# Patient Record
Sex: Male | Born: 1945 | Race: Black or African American | Hispanic: No | Marital: Single | Smoking: Never smoker
Health system: Southern US, Community
[De-identification: ages and names within clinical notes are randomized; demographics above are authoritative.]

## PROBLEM LIST (undated history)

## (undated) DIAGNOSIS — M1990 Unspecified osteoarthritis, unspecified site: Secondary | ICD-10-CM

## (undated) DIAGNOSIS — F209 Schizophrenia, unspecified: Secondary | ICD-10-CM

## (undated) DIAGNOSIS — R29898 Other symptoms and signs involving the musculoskeletal system: Secondary | ICD-10-CM

## (undated) DIAGNOSIS — I10 Essential (primary) hypertension: Secondary | ICD-10-CM

## (undated) DIAGNOSIS — N429 Disorder of prostate, unspecified: Secondary | ICD-10-CM

## (undated) DIAGNOSIS — M25462 Effusion, left knee: Secondary | ICD-10-CM

## (undated) DIAGNOSIS — M25461 Effusion, right knee: Secondary | ICD-10-CM

## (undated) DIAGNOSIS — H409 Unspecified glaucoma: Secondary | ICD-10-CM

## (undated) DIAGNOSIS — T7840XA Allergy, unspecified, initial encounter: Secondary | ICD-10-CM

## (undated) HISTORY — DX: Effusion, right knee: M25.461

## (undated) HISTORY — DX: Unspecified osteoarthritis, unspecified site: M19.90

## (undated) HISTORY — DX: Effusion, left knee: M25.462

## (undated) HISTORY — DX: Unspecified glaucoma: H40.9

## (undated) HISTORY — DX: Allergy, unspecified, initial encounter: T78.40XA

## (undated) HISTORY — DX: Other symptoms and signs involving the musculoskeletal system: R29.898

---

## 1995-10-18 HISTORY — PX: ANKLE SURGERY: SHX546

## 2014-10-27 ENCOUNTER — Ambulatory Visit (HOSPITAL_BASED_OUTPATIENT_CLINIC_OR_DEPARTMENT_OTHER): Payer: Medicare Other

## 2015-04-06 ENCOUNTER — Emergency Department (INDEPENDENT_AMBULATORY_CARE_PROVIDER_SITE_OTHER)
Admission: EM | Admit: 2015-04-06 | Discharge: 2015-04-06 | Disposition: A | Discharge status: Home / Self Care | Payer: Medicare Other | Source: Home / Self Care | Attending: Emergency Medicine | Admitting: Emergency Medicine

## 2015-04-06 ENCOUNTER — Encounter (HOSPITAL_COMMUNITY): Payer: Self-pay | Admitting: Emergency Medicine

## 2015-04-06 DIAGNOSIS — L97512 Non-pressure chronic ulcer of other part of right foot with fat layer exposed: Secondary | ICD-10-CM

## 2015-04-06 DIAGNOSIS — L03115 Cellulitis of right lower limb: Secondary | ICD-10-CM | POA: Diagnosis not present

## 2015-04-06 HISTORY — DX: Disorder of prostate, unspecified: N42.9

## 2015-04-06 HISTORY — DX: Essential (primary) hypertension: I10

## 2015-04-06 HISTORY — DX: Schizophrenia, unspecified: F20.9

## 2015-04-06 MED ORDER — CEFTRIAXONE SODIUM 1 G IJ SOLR
1.0000 g | Freq: Once | INTRAMUSCULAR | Status: AC
  Administered 2015-04-06: 1 g via INTRAMUSCULAR

## 2015-04-06 MED ORDER — CEFTRIAXONE SODIUM 1 G IJ SOLR
INTRAMUSCULAR | Status: AC
  Filled 2015-04-06: qty 10

## 2015-04-06 MED ORDER — LIDOCAINE HCL (PF) 1 % IJ SOLN
INTRAMUSCULAR | Status: AC
Start: 2015-04-06 — End: 2015-04-06
  Filled 2015-04-06: qty 5

## 2015-04-06 MED ORDER — BACITRACIN 500 UNIT/GM EX OINT
1.0000 "application " | TOPICAL_OINTMENT | Freq: Once | CUTANEOUS | Status: AC
  Administered 2015-04-06: 1 via TOPICAL

## 2015-04-06 MED ORDER — CLINDAMYCIN HCL 300 MG PO CAPS: 300.0000 mg | ORAL_CAPSULE | Freq: Three times a day (TID) | ORAL | Status: DC

## 2015-04-06 NOTE — ED Provider Notes (Signed)
CSN: [id number]     Arrival date & time 04/06/15  1857 History   First MD Initiated Contact with Patient 04/06/15 1955     Chief Complaint  Patient presents with  . Wound Infection   (Consider location/radiation/quality/duration/timing/severity/associated sxs/prior Treatment) HPI Comments: 69 year old male apparently has had an ulcer to the right foot for 2-3 weeks. It was noticed by his significant other last night. She brings him in today for swelling of the lower extremity about 1 Half Way between the leg and the ankle including the ankle and foot. There is approximately 6 x 4 cm open wound/ulceration to the right lateral ankle. There is no purulence. No lymphangitis. Minor erythema about the ankle and lower most right lower extremity.   Past Medical History  Diagnosis Date  . Hypertension   . Prostate disorder   . Schizophrenia    History reviewed. No pertinent past surgical history. No family history on file. History  Substance Use Topics  . Smoking status: Never Smoker   . Smokeless tobacco: Not on file  . Alcohol Use: No    Review of Systems  Constitutional: Negative.  Negative for fever and fatigue.  Respiratory: Negative for cough and shortness of breath.   Gastrointestinal: Negative.   Musculoskeletal: Negative.   Skin: Positive for wound.  Neurological: Negative.     Allergies  Review of patient's allergies indicates no known allergies.  Home Medications   Prior to Admission medications   Medication Sig Start Date End Date Taking? Authorizing Provider  chlorthalidone (HYGROTON) 25 MG tablet Take 25 mg by mouth daily.   Yes Historical Provider, MD  diclofenac (VOLTAREN) 75 MG EC tablet Take 75 mg by mouth 2 (two) times daily.   Yes Historical Provider, MD  finasteride (PROSCAR) 5 MG tablet Take 5 mg by mouth daily.   Yes Historical Provider, MD  furosemide (LASIX) 20 MG tablet Take 20 mg by mouth.   Yes Historical Provider, MD  travoprost, benzalkonium,  (TRAVATAN) 0.004 % ophthalmic solution 1 drop at bedtime.   Yes Historical Provider, MD  clindamycin (CLEOCIN) 300 MG capsule Take 1 capsule (300 mg total) by mouth 3 (three) times daily. 04/06/15   Hayden Rasmussen, NP   BP 157/96 mmHg  Pulse 101  Temp(Src) 98.2 F (36.8 C) (Oral)  Resp 18  SpO2 99% Physical Exam  Constitutional: He appears well-developed and well-nourished. No distress.  Neck: Normal range of motion. Neck supple.  Pulmonary/Chest: Effort normal. No respiratory distress.  Musculoskeletal: He exhibits edema.  Edema about halfway down the right lower extremity to include the foot. Edema of the lower extremity is 4+ pitting.  Neurological: He is alert. He exhibits normal muscle tone.  Skin: Skin is warm and dry. No rash noted. There is erythema.  Ulceration/open skin wound to the right lateral ankle. No purulence. Tissue with primarily pink  Psychiatric: He has a normal mood and affect.  Nursing note and vitals reviewed.   ED Course  Procedures (including critical care time) Labs Review Labs Reviewed - No data to display  Imaging Review No results found.   MDM   1. Foot ulcer, right, with fat layer exposed   2. Cellulitis of right lower extremity    Irrigated wound with wound cleanser and normal saline. Bacitracin ointment applied and sterile dressing. We will wrap the foot and right lower extremity with Ace bandage to assist with compression due to edema. He is to follow back up here at the urgent care in 2 days  for wound check. He has an appointment with the wound care center on Friday and will keep that appointment. Clindamycin 300 mg 3 times a day Rocephin 1 g IM now    Hayden Rasmussen, NP 04/06/15 2024

## 2015-04-06 NOTE — Discharge Instructions (Signed)
Cellulitis °Cellulitis is an infection of the skin and the tissue under the skin. The infected area is usually red and tender. This happens most often in the arms and lower legs. °HOME CARE  °· Take your antibiotic medicine as told. Finish the medicine even if you start to feel better. °· Keep the infected arm or leg raised (elevated). °· Put a warm cloth on the area up to 4 times per day. °· Only take medicines as told by your doctor. °· Keep all doctor visits as told. °GET HELP IF: °· You see red streaks on the skin coming from the infected area. °· Your red area gets bigger or turns a dark color. °· Your bone or joint under the infected area is painful after the skin heals. °· Your infection comes back in the same area or different area. °· You have a puffy (swollen) bump in the infected area. °· You have new symptoms. °· You have a fever. °GET HELP RIGHT AWAY IF:  °· You feel very sleepy. °· You throw up (vomit) or have watery poop (diarrhea). °· You feel sick and have muscle aches and pains. °MAKE SURE YOU:  °· Understand these instructions. °· Will watch your condition. °· Will get help right away if you are not doing well or get worse. °Document Released: 03/21/2008 Document Revised: 02/17/2014 Document Reviewed: 12/19/2011 °ExitCare® Patient Information ©2015 [employer]. This information is not intended to replace advice given to you by your health care provider. Make sure you discuss any questions you have with your health care provider. ° °Skin Ulcer °A skin ulcer is an open sore that can be shallow or deep. Skin ulcers sometimes become infected and are difficult to treat. It may be 1 month or longer before real healing progress is made. °CAUSES  °· Injury. °· Problems with the veins or arteries. °· Diabetes. °· Insect bites. °· Bedsores. °· Inflammatory conditions. °SYMPTOMS  °· Pain, redness, swelling, and tenderness around the ulcer. °· Fever. °· Bleeding from the ulcer. °· Yellow or clear fluid  coming from the ulcer. °DIAGNOSIS  °There are many types of skin ulcers. Any open sores will be examined. Certain tests will be done to determine the kind of ulcer you have. The right treatment depends on the type of ulcer you have. °TREATMENT  °Treatment is a long-term challenge. It may include: °· Wearing an elastic wrap, compression stockings, or gel cast over the ulcer area. °· Taking antibiotic medicines or putting antibiotic creams on the affected area if there is an infection. °HOME CARE INSTRUCTIONS °· Put on your bandages (dressings), wraps, or casts over the ulcer as directed by your caregiver. °· Change all dressings as directed by your caregiver. °· Take all medicines as directed by your caregiver. °· Keep the affected area clean and dry. °· Avoid injuries to the affected area. °· Eat a well-balanced, healthy diet that includes plenty of fruit and vegetables. °· If you smoke, consider quitting or decreasing the amount of cigarettes you smoke. °· Once the ulcer heals, get regular exercise as directed by your caregiver. °· Work with your caregiver to make sure your blood pressure, cholesterol, and diabetes are well-controlled. °· Keep your skin moisturized. Dry skin can crack and lead to skin ulcers. °SEEK IMMEDIATE MEDICAL CARE IF:  °· Your pain gets worse. °· You have swelling, redness, or fluids around the ulcer. °· You have chills. °· You have a fever. °MAKE SURE YOU:  °· Understand these instructions. °· Will   watch your condition. °· Will get help right away if you are not doing well or get worse. °Document Released: 11/10/2004 Document Revised: 12/26/2011 Document Reviewed: 05/20/2011 °ExitCare® Patient Information ©2015 [employer]. This information is not intended to replace advice given to you by your health care provider. Make sure you discuss any questions you have with your health care provider. ° °

## 2015-04-06 NOTE — ED Notes (Signed)
Pt has a wound inf on lateral part of right ankle onset 3 weeks; getting worse Sister put a dressing on it last night Denies inj/trauma Ambulated well to exam room Alert, no signs of acute distress.

## 2015-04-08 ENCOUNTER — Emergency Department (HOSPITAL_COMMUNITY)
Admission: EM | Admit: 2015-04-08 | Discharge: 2015-04-08 | Disposition: A | Discharge status: Home / Self Care | Payer: Medicare Other | Source: Home / Self Care | Attending: Family Medicine | Admitting: Family Medicine

## 2015-04-08 ENCOUNTER — Encounter (HOSPITAL_COMMUNITY): Payer: Self-pay | Admitting: Emergency Medicine

## 2015-04-08 DIAGNOSIS — L97312 Non-pressure chronic ulcer of right ankle with fat layer exposed: Secondary | ICD-10-CM | POA: Diagnosis not present

## 2015-04-08 NOTE — ED Notes (Signed)
Patient requesting leg to be reevaluated and rewrapped.

## 2015-04-08 NOTE — ED Notes (Signed)
Ortho tech at bedside 

## 2015-04-08 NOTE — Discharge Instructions (Signed)
Go to wound center as planned, keep bandage dry.

## 2015-04-08 NOTE — ED Provider Notes (Signed)
CSN: [id number]     Arrival date & time 04/08/15  1847 History   First MD Initiated Contact with Patient 04/08/15 1924     No chief complaint on file.  (Consider location/radiation/quality/duration/timing/severity/associated sxs/prior Treatment) Patient is a 69 y.o. male presenting with rash. The history is provided by the patient.  Rash Location:  Leg Leg rash location:  R ankle Quality: draining and weeping   Severity:  Moderate Onset quality:  Gradual Duration:  3 weeks Progression:  Unchanged Chronicity:  Chronic Context comment:  Ulceration care begun on 6/20 has wound care center f/u on fri upcoming, here for recheck.   Past Medical History  Diagnosis Date  . Hypertension   . Prostate disorder   . Schizophrenia    No past surgical history on file. No family history on file. History  Substance Use Topics  . Smoking status: Never Smoker   . Smokeless tobacco: Not on file  . Alcohol Use: No    Review of Systems  Constitutional: Negative.   Skin: Positive for rash and wound.    Allergies  Review of patient's allergies indicates no known allergies.  Home Medications   Prior to Admission medications   Medication Sig Start Date End Date Taking? Authorizing Provider  chlorthalidone (HYGROTON) 25 MG tablet Take 25 mg by mouth daily.    Historical Provider, MD  clindamycin (CLEOCIN) 300 MG capsule Take 1 capsule (300 mg total) by mouth 3 (three) times daily. 04/06/15   Hayden Rasmussen, NP  diclofenac (VOLTAREN) 75 MG EC tablet Take 75 mg by mouth 2 (two) times daily.    Historical Provider, MD  finasteride (PROSCAR) 5 MG tablet Take 5 mg by mouth daily.    Historical Provider, MD  furosemide (LASIX) 20 MG tablet Take 20 mg by mouth.    Historical Provider, MD  travoprost, benzalkonium, (TRAVATAN) 0.004 % ophthalmic solution 1 drop at bedtime.    Historical Provider, MD   BP 149/87 mmHg  Pulse 95  Temp(Src) 98.2 F (36.8 C) (Oral)  Resp 20  SpO2 100% Physical Exam   Constitutional: He is oriented to person, place, and time. He appears well-developed and well-nourished.  Musculoskeletal: He exhibits no tenderness.  Neurological: He is alert and oriented to person, place, and time.  Skin: Skin is warm and dry. No erythema.  Right lat ankle open ulcer  Nursing note and vitals reviewed.   ED Course  Procedures (including critical care time) Labs Review Labs Reviewed - No data to display  Imaging Review No results found.   MDM  No diagnosis found. Wound care for leg ulcer, unna boot wrap.    Linna Hoff, MD 04/08/15 [phone]

## 2015-09-23 DIAGNOSIS — L97312 Non-pressure chronic ulcer of right ankle with fat layer exposed: Secondary | ICD-10-CM | POA: Insufficient documentation

## 2017-05-10 DIAGNOSIS — I872 Venous insufficiency (chronic) (peripheral): Secondary | ICD-10-CM | POA: Insufficient documentation

## 2017-08-09 DIAGNOSIS — L97312 Non-pressure chronic ulcer of right ankle with fat layer exposed: Secondary | ICD-10-CM | POA: Insufficient documentation

## 2018-01-22 ENCOUNTER — Encounter: Payer: Self-pay | Admitting: Gastroenterology

## 2018-03-26 ENCOUNTER — Encounter: Payer: Medicare Other | Admitting: Gastroenterology

## 2018-05-10 ENCOUNTER — Ambulatory Visit (AMBULATORY_SURGERY_CENTER): Payer: Self-pay

## 2018-05-10 VITALS — Ht 70.0 in | Wt 204.6 lb

## 2018-05-10 DIAGNOSIS — Z1211 Encounter for screening for malignant neoplasm of colon: Secondary | ICD-10-CM

## 2018-05-10 MED ORDER — PEG 3350-KCL-NA BICARB-NACL 420 G PO SOLR: 4000.0000 mL | Freq: Once | ORAL | 0 refills | Status: AC

## 2018-05-10 NOTE — Progress Notes (Signed)
Pt came into the office today for his PV via a wheelchair.  Patients sister Brandon [name](Brandon [name]) was with him. Spent over an hour with the pt and family member trying to get medical history and answer medical questions..Pt had difficulty getting on the scale for his correct height and weight due to difficulty standing for long period of time.Informed pt he would need to be able to climb on the table for the colon. He states he could walk with a walker, but would need assistance getting up on the table.   Per pt no allergies to soy or egg products.Pt not taking any weight loss meds or using  O2 at home.  Pt does not have email, will watch colon online.

## 2018-05-25 ENCOUNTER — Encounter: Payer: Medicare Other | Admitting: Gastroenterology

## 2018-06-20 ENCOUNTER — Encounter: Payer: Self-pay | Admitting: Gastroenterology

## 2018-07-04 ENCOUNTER — Encounter: Payer: Medicare Other | Admitting: Gastroenterology

## 2019-01-01 ENCOUNTER — Encounter: Payer: Self-pay | Admitting: Gastroenterology

## 2019-01-29 ENCOUNTER — Telehealth: Payer: Self-pay | Admitting: Emergency Medicine

## 2019-01-29 NOTE — Telephone Encounter (Signed)
OK with me.

## 2019-01-29 NOTE — Telephone Encounter (Signed)
Dr. Christella Hartigan   This patient was scheduled to see Dr. Orvan Falconer on 01-29-2019, his sister requested that both she and her brother see you because her mother is your patient. Ok to switch?

## 2019-01-30 ENCOUNTER — Ambulatory Visit: Payer: Medicare Other | Admitting: Gastroenterology

## 2019-01-30 NOTE — Telephone Encounter (Signed)
Patient scheduled for 02/04/2019 with Dr. Christella Hartigan

## 2019-01-30 NOTE — Telephone Encounter (Signed)
I am happy to see the whole family.  One at a time of course.  Offer my next available NGI appts, thanks

## 2019-02-04 ENCOUNTER — Ambulatory Visit (INDEPENDENT_AMBULATORY_CARE_PROVIDER_SITE_OTHER): Payer: Medicare Other | Admitting: Gastroenterology

## 2019-02-04 ENCOUNTER — Other Ambulatory Visit: Payer: Self-pay

## 2019-02-04 ENCOUNTER — Encounter: Payer: Self-pay | Admitting: Gastroenterology

## 2019-02-04 VITALS — BP 140/74 | HR 103 | Ht 72.0 in | Wt 209.0 lb

## 2019-02-04 DIAGNOSIS — R195 Other fecal abnormalities: Secondary | ICD-10-CM | POA: Diagnosis not present

## 2019-02-04 NOTE — Progress Notes (Signed)
This service was provided via virtual visit.  Only audio was used.  The patient was located at home.  I was located in my office.  The patient did consent to this virtual visit and is aware of possible charges through their insurance for this visit.  The patient is a new patient.  My certified medical assistant contributed to this visit by contacting the patient by phone 1 or 2 business days prior to the appointment and also followed up on the recommendations I made after the visit.   HPI: This is a very pleasant 73 year old man whom I am meeting for the first time over the phone today because of coronavirus restrictions.  He has some psychiatric issues and his sister is with him who provided some of the history.  Intermittent trouble with his bowels for 1-2 years.  He's been having diarrhea, soft stools.  This occurs every day. He has 2 BMs daily.  It can be mushy.  Never sees blood in his stools.  No nocturnal issues.  Can have watery stools.  Has lost 5 pounds in the past few months.  No abdominal pains.  No colon cancer in the family.  Never screened for colon cancer.  He drinks sodas (sounds like 2-3 slushies 16 oz daily).  Never had testing for the loose stools.  He has a cologuard stool testing sent to him by his insurance company, hasn't completed it yet.   Chief complaint is chronic loose stools  ROS: complete GI ROS as described in HPI, all other review negative.  Constitutional:  No unintentional weight loss   Past Medical History:  Diagnosis Date  . Allergy   . Arthritis   . Bilateral knee swelling   . Glaucoma   . Hypertension   . Prostate disorder   . Schizophrenia (HCC)   . Weakness of lower extremity    left knee, uses walker    Past Surgical History:  Procedure Laterality Date  . ANKLE SURGERY  1997   had broken right ankle/had 3 surgeries in 1996/ has pins and rods    Current Outpatient Medications  Medication Sig Dispense Refill  . aspirin EC 81  MG tablet Take 81 mg by mouth daily.    Marland Kitchen buPROPion (WELLBUTRIN) 75 MG tablet Take 75 mg by mouth daily.    . finasteride (PROSCAR) 5 MG tablet Take 5 mg by mouth daily.    . furosemide (LASIX) 20 MG tablet Take 40 mg by mouth.     . losartan (COZAAR) 25 MG tablet Take 25 mg by mouth daily.    . tamsulosin (FLOMAX) 0.4 MG CAPS capsule Take 0.4 mg by mouth daily.    . travoprost, benzalkonium, (TRAVATAN) 0.004 % ophthalmic solution Place 1 drop into both eyes at bedtime.     Marland Kitchen trifluoperazine (STELAZINE) 5 MG tablet Take 5 mg by mouth at bedtime.    . Vitamin D, Ergocalciferol, (DRISDOL) 50000 units CAPS capsule Take 50,000 Units by mouth every 7 (seven) days.     No current facility-administered medications for this visit.     Allergies as of 02/04/2019  . (No Known Allergies)    Family History  Problem Relation Age of Onset  . Kidney disease Mother   . Breast cancer Mother   . Breast cancer Father   . Leukemia Father     Social History   Socioeconomic History  . Marital status: Single    Spouse name: Not on file  . Number of children: Not  on file  . Years of education: Not on file  . Highest education level: Not on file  Occupational History  . Not on file  Social Needs  . Financial resource strain: Not on file  . Food insecurity:    Worry: Not on file    Inability: Not on file  . Transportation needs:    Medical: Not on file    Non-medical: Not on file  Tobacco Use  . Smoking status: Never Smoker  . Smokeless tobacco: Never Used  Substance and Sexual Activity  . Alcohol use: No  . Drug use: No  . Sexual activity: Not on file  Lifestyle  . Physical activity:    Days per week: Not on file    Minutes per session: Not on file  . Stress: Not on file  Relationships  . Social connections:    Talks on phone: Not on file    Gets together: Not on file    Attends religious service: Not on file    Active member of club or organization: Not on file    Attends  meetings of clubs or organizations: Not on file    Relationship status: Not on file  . Intimate partner violence:    Fear of current or ex partner: Not on file    Emotionally abused: Not on file    Physically abused: Not on file    Forced sexual activity: Not on file  Other Topics Concern  . Not on file  Social History Narrative  . Not on file     Physical Exam: Unable to perform because this was a "telemed visit" due to current Covid-19 pandemic BP 140/74   Pulse (!) 103   Ht 6' (1.829 m)   Wt 209 lb (94.8 kg)   BMI 28.35 kg/m    Assessment and plan: 73 y.o. male with chronic loose stools  Unclear etiology but I recommended stool testing and blood tests to start off the work-up.  See those summarized in patient instructions.  If these are not helpful then he will likely need a colonoscopy.  Please see the "Patient Instructions" section for addition details about the plan.  Rob Buntinganiel Delrico Minehart, MD [facility] 02/04/2019, 2:06 PM

## 2019-02-04 NOTE — Patient Instructions (Signed)
Blood test and stool tests to work-up his chronic loose stools, diarrhea: Sed rate, total IgA level, TTG, TSH, stool for GI pathogen panel and also for fecal leukocytes.   Pending those results he may need a colonoscopy and he understands that.

## 2019-03-28 DIAGNOSIS — R059 Cough, unspecified: Secondary | ICD-10-CM | POA: Insufficient documentation

## 2019-03-28 DIAGNOSIS — K219 Gastro-esophageal reflux disease without esophagitis: Secondary | ICD-10-CM | POA: Insufficient documentation

## 2019-03-28 DIAGNOSIS — H9113 Presbycusis, bilateral: Secondary | ICD-10-CM | POA: Insufficient documentation

## 2019-10-09 ENCOUNTER — Other Ambulatory Visit: Payer: Self-pay

## 2019-10-09 ENCOUNTER — Encounter: Payer: Self-pay | Admitting: Podiatry

## 2019-10-09 ENCOUNTER — Ambulatory Visit: Payer: Medicare Other | Admitting: Podiatry

## 2019-10-09 DIAGNOSIS — M79674 Pain in right toe(s): Secondary | ICD-10-CM

## 2019-10-09 DIAGNOSIS — B351 Tinea unguium: Secondary | ICD-10-CM | POA: Insufficient documentation

## 2019-10-09 DIAGNOSIS — M79675 Pain in left toe(s): Secondary | ICD-10-CM | POA: Diagnosis not present

## 2019-10-09 NOTE — Progress Notes (Signed)
This patient presents to the office for treatment of his  long thick painful nails.  Patient says the nails are painful walking and wearing shoes.  Patient is unable to self treat.   Patient states his nails have been treated for months.  He presents to the office for preventative foot care services.  General Appearance  Alert, conversant and in no acute stress.  Vascular  Dorsalis pedis and posterior tibial  pulses are palpable  bilaterally.  Capillary return is within normal limits  bilaterally. Temperature is within normal limits  bilaterally.  Neurologic  Senn-Weinstein monofilament wire test within normal limits  bilaterally. Muscle power diminished  bilaterally.  Nails Thick disfigured discolored nails with subungual debris  from hallux to fifth toes bilaterally. No evidence of bacterial infection or drainage bilaterally.  Orthopedic  No limitations of motion  feet .  No crepitus or effusions noted.  HAV  B/L.  Hallux Interphalangeus  B/L.    Skin  normotropic skin with no porokeratosis noted bilaterally.  No signs of infections or ulcers noted.  Callus at medial plantar aspect IPJ  B/L asymptomatic.  Onychomycosis  B/L.  Callus hallux  B/L   IE.  Debride nails  X 10.   Discussed his nails with patient.  Recommended he return in 3 months for continued care.  Upon leaving he says he was feeling much better.     Gardiner Barefoot DPM

## 2019-11-07 ENCOUNTER — Ambulatory Visit: Payer: Medicare Other | Attending: Internal Medicine

## 2019-11-07 DIAGNOSIS — Z23 Encounter for immunization: Secondary | ICD-10-CM | POA: Insufficient documentation

## 2019-11-07 NOTE — Progress Notes (Signed)
   Covid-19 Vaccination Clinic  Name:  Brandon [name]    MRN: [id number] DOB: 1946/06/23  11/07/2019  [name] was observed post Covid-19 immunization for 15 minutes without incidence. He was provided with Vaccine Information Sheet and instruction to access the V-Safe system.   [name] was instructed to call 911 with any severe reactions post vaccine: Marland Kitchen Difficulty breathing  . Swelling of your face and throat  . A fast heartbeat  . A bad rash all over your body  . Dizziness and weakness    Immunizations Administered    Name Date Dose VIS Date Route   Pfizer COVID-19 Vaccine 11/07/2019  3:29 PM 0.3 mL 09/27/2019 Intramuscular   Manufacturer: [employer], [employer]   Lot: [id number]   NDC: [address]-6

## 2019-11-28 ENCOUNTER — Ambulatory Visit: Payer: Medicare Other | Attending: Internal Medicine

## 2019-11-28 DIAGNOSIS — Z23 Encounter for immunization: Secondary | ICD-10-CM | POA: Insufficient documentation

## 2019-11-28 NOTE — Progress Notes (Signed)
   Covid-19 Vaccination Clinic  Name:  Brandon [name]    MRN: [id number] DOB: [birth year]  11/28/2019  Mr. Toren was observed post Covid-19 immunization for 15 minutes without incidence. He was provided with Vaccine Information Sheet and instruction to access the V-Safe system.   Mr. Belmontes was instructed to call 911 with any severe reactions post vaccine: Marland Kitchen Difficulty breathing  . Swelling of your face and throat  . A fast heartbeat  . A bad rash all over your body  . Dizziness and weakness    Immunizations Administered    Name Date Dose VIS Date Route   Pfizer COVID-19 Vaccine 11/28/2019  4:48 PM 0.3 mL 09/27/2019 Intramuscular   Manufacturer: [employer], [employer]   Lot: [id number]   NDC: [address]-7

## 2020-01-07 ENCOUNTER — Other Ambulatory Visit: Payer: Self-pay

## 2020-01-07 ENCOUNTER — Encounter: Payer: Self-pay | Admitting: Podiatry

## 2020-01-07 ENCOUNTER — Ambulatory Visit: Payer: Medicare Other | Admitting: Podiatry

## 2020-01-07 DIAGNOSIS — B351 Tinea unguium: Secondary | ICD-10-CM | POA: Diagnosis not present

## 2020-01-07 DIAGNOSIS — M79675 Pain in left toe(s): Secondary | ICD-10-CM | POA: Diagnosis not present

## 2020-01-07 DIAGNOSIS — B353 Tinea pedis: Secondary | ICD-10-CM

## 2020-01-07 DIAGNOSIS — M79674 Pain in right toe(s): Secondary | ICD-10-CM

## 2020-01-07 DIAGNOSIS — L84 Corns and callosities: Secondary | ICD-10-CM

## 2020-01-07 MED ORDER — CICLOPIROX OLAMINE 0.77 % EX CREA: TOPICAL_CREAM | Freq: Two times a day (BID) | CUTANEOUS | 2 refills | Status: AC

## 2020-01-07 NOTE — Patient Instructions (Addendum)
WEEKLY FOOT SOAK INSTRUCTIONS FOR FOOT HYGIENE   1. SOAK FEET IN LUKEWARM SOAPY WATER FOR 10 MINUTES. HAVE A FAMILY MEMBER OR CAREGIVER CHECK THE WATER TEMPERATURE FOR YOU BEFORE SUBMERGING YOUR FEET IN THE WATER.  2.  DRY FEET WELL TAKING CARE TO DRY WELL BETWEEN TOES AND UNDER TOES.  3.  APPLY MOISTURIZING CREAM TO FEET AVOIDING APPLICATION BETWEEN TOES.  Moisturize feet once daily; do not apply between toes: Vaseline Intensive Care Lotion Lubriderm Lotion Gold Bond Diabetic Foot Lotion Eucerin Intensive Repair Moisturizing Lotion  If you have problems reaching your feet:  Aquaphor Advanced Therapy Ointment Body Spray Vaseline Intensive Care Spray Lotion Advanced Repair      Athlete's Foot  Athlete's foot (tinea pedis) is a fungal infection of the skin on your feet. It often occurs on the skin that is between or underneath the toes. It can also occur on the soles of your feet. The infection can spread from person to person (is contagious). It can also spread when a person's bare feet come in contact with the fungus on shower floors or on items such as shoes. What are the causes? This condition is caused by a fungus that grows in warm, moist places. You can get athlete's foot by sharing shoes, shower stalls, towels, and wet floors with someone who is infected. Not washing your feet or changing your socks often enough can also lead to athlete's foot. What increases the risk? This condition is more likely to develop in:  Men.  People who have a weak body defense system (immune system).  People who have diabetes.  People who use public showers, such as at a gym.  People who wear heavy-duty shoes, such as Environmental manager.  Seasons with warm, humid weather. What are the signs or symptoms? Symptoms of this condition include:  Itchy areas between your toes or on the soles of your feet.  White, flaky, or scaly areas between your toes or on the soles of your  feet.  Very itchy small blisters between your toes or on the soles of your feet.  Small cuts in your skin. These cuts can become infected.  Thick or discolored toenails. How is this diagnosed? This condition may be diagnosed with a physical exam and a review of your medical history. Your health care provider may also take a skin or toenail sample to examine under a microscope. How is this treated? This condition is treated with antifungal medicines. These may be applied as powders, ointments, or creams. In severe cases, an oral antifungal medicine may be given. Follow these instructions at home: Medicines  Apply or take over-the-counter and prescription medicines only as told by your health care provider.  Apply your antifungal medicine as told by your health care provider. Do not stop using the antifungal even if your condition improves. Foot care  Do not scratch your feet.  Keep your feet dry: ? Wear cotton or wool socks. Change your socks every day or if they become wet. ? Wear shoes that allow air to flow, such as sandals or canvas tennis shoes.  Wash and dry your feet, including the area between your toes. Also, wash and dry your feet: ? Every day or as told by your health care provider. ? After exercising. General instructions  Do not let others use towels, shoes, nail clippers, or other personal items that touch your feet.  Protect your feet by wearing sandals in wet areas, such as locker rooms and shared  showers.  Keep all follow-up visits as told by your health care provider. This is important.  If you have diabetes, keep your blood sugar under control. Contact a health care provider if:  You have a fever.  You have swelling, soreness, warmth, or redness in your foot.  Your feet are not getting better with treatment.  Your symptoms get worse.  You have new symptoms. Summary  Athlete's foot (tinea pedis) is a fungal infection of the skin on your feet. It  often occurs on skin that is between or underneath the toes.  This condition is caused by a fungus that grows in warm, moist places.  Symptoms include white, flaky, or scaly areas between your toes or on the soles of your feet.  This condition is treated with antifungal medicines.  Keep your feet clean. Always dry them thoroughly. This information is not intended to replace advice given to you by your health care provider. Make sure you discuss any questions you have with your health care provider. Document Revised: 09/28/2017 Document Reviewed: 07/24/2017 Elsevier Patient Education  2020 Elsevier Inc.    Onychomycosis/Fungal Toenails  WHAT IS IT? An infection that lies within the keratin of your nail plate that is caused by a fungus.  WHY ME? Fungal infections affect all ages, sexes, races, and creeds.  There may be many factors that predispose you to a fungal infection such as age, coexisting medical conditions such as diabetes, or an autoimmune disease; stress, medications, fatigue, genetics, etc.  Bottom line: fungus thrives in a warm, moist environment and your shoes offer such a location.  IS IT CONTAGIOUS? Theoretically, yes.  You do not want to share shoes, nail clippers or files with someone who has fungal toenails.  Walking around barefoot in the same room or sleeping in the same bed is unlikely to transfer the organism.  It is important to realize, however, that fungus can spread easily from one nail to the next on the same foot.  HOW DO WE TREAT THIS?  There are several ways to treat this condition.  Treatment may depend on many factors such as age, medications, pregnancy, liver and kidney conditions, etc.  It is best to ask your doctor which options are available to you.  7. No treatment.   Unlike many other medical concerns, you can live with this condition.  However for many people this can be a painful condition and may lead to ingrown toenails or a bacterial infection.  It  is recommended that you keep the nails cut short to help reduce the amount of fungal nail. 8. Topical treatment.  These range from herbal remedies to prescription strength nail lacquers.  About 40-50% effective, topicals require twice daily application for approximately 9 to 12 months or until an entirely new nail has grown out.  The most effective topicals are medical grade medications available through physicians offices. 9. Oral antifungal medications.  With an 80-90% cure rate, the most common oral medication requires 3 to 4 months of therapy and stays in your system for a year as the new nail grows out.  Oral antifungal medications do require blood work to make sure it is a safe drug for you.  A liver function panel will be performed prior to starting the medication and after the first month of treatment.  It is important to have the blood work performed to avoid any harmful side effects.  In general, this medication safe but blood work is required. 10. Laser Therapy.  This treatment is performed by applying a specialized laser to the affected nail plate.  This therapy is noninvasive, fast, and non-painful.  It is not covered by insurance and is therefore, out of pocket.  The results have been very good with a 80-95% cure rate.  [facility] is the only practice in the area to offer this therapy. 11. Permanent Nail Avulsion.  Removing the entire nail so that a new nail will not grow back.

## 2020-01-12 NOTE — Progress Notes (Signed)
Subjective: Brandon [name] presents today for follow up of painful mycotic nails b/l that are difficult to trim. Pain interferes with ambulation. Aggravating factors include wearing enclosed shoe gear. Pain is relieved with periodic professional debridement.   He voices no new pedal problems on today's visit.  Allergies  Allergen Reactions  . No Known Allergies     Objective: There were no vitals filed for this visit.  Pt 74 y.o. year old AA male in NAD. AAO x 3.   Vascular Examination:  Capillary refill time to digits immediate b/l. Palpable DP pulses b/l. Palpable PT pulses b/l. Pedal hair absent b/l Skin temperature gradient within normal limits b/l. Trace edema noted b/l feet.  Dermatological Examination: No open wounds bilaterally. No interdigital macerations bilaterally. Toenails 1-5 b/l elongated, dystrophic, thickened, crumbly with subungual debris and tenderness to dorsal palpation. Hyperkeratotic lesion(s) L hallux and R hallux.  No erythema, no edema, no drainage, no flocculence. Pedal skin noted to exhibit signs of poor pedal hygiene with noted foot odor b/l and interdigital debris. No open wounds noted. Diffuse scaling noted peripherally and plantarly b/l feet with mild foot odor.  No interdigital macerations.  No blisters, no weeping. No signs of secondary bacterial infection noted.  Musculoskeletal: Normal muscle strength 5/5 to all lower extremity muscle groups bilaterally, no pain crepitus or joint limitation noted with ROM b/l and bunion deformity noted b/l  Neurological: Protective sensation intact 5/5 intact bilaterally with 10g monofilament b/l Vibratory sensation intact b/l  Assessment: 1. Pain due to onychomycosis of toenails of both feet   2. Callus   3. Tinea pedis of both feet    Plan: -Dispensed written instructions for once weekly hygiene foot soaks. -Dispensed list of moisturizers. -Toenails 1-5 b/l were debrided in length and girth with sterile nail  nippers and dremel without iatrogenic bleeding.  -Callus(es) L hallux and R hallux were debrided without complication or incident. Total number debrided =2. -Patient to continue soft, supportive shoe gear daily. -Patient to report any pedal injuries to medical professional immediately. -Patient/POA to call should there be question/concern in the interim.  Return in about 3 months (around 04/08/2020) for nail trim.

## 2020-04-10 ENCOUNTER — Ambulatory Visit: Payer: Medicare Other | Admitting: Podiatry

## 2020-04-13 ENCOUNTER — Other Ambulatory Visit: Payer: Self-pay

## 2020-04-13 ENCOUNTER — Encounter: Payer: Self-pay | Admitting: Podiatry

## 2020-04-13 ENCOUNTER — Ambulatory Visit: Payer: Medicare Other | Admitting: Podiatry

## 2020-04-13 DIAGNOSIS — B351 Tinea unguium: Secondary | ICD-10-CM | POA: Diagnosis not present

## 2020-04-13 DIAGNOSIS — L84 Corns and callosities: Secondary | ICD-10-CM

## 2020-04-13 DIAGNOSIS — M79674 Pain in right toe(s): Secondary | ICD-10-CM

## 2020-04-13 DIAGNOSIS — M79675 Pain in left toe(s): Secondary | ICD-10-CM

## 2020-04-17 NOTE — Progress Notes (Signed)
Subjective: Brandon [name] presents today for follow up of painful mycotic nails b/l that are difficult to trim. Pain interferes with ambulation. Aggravating factors include wearing enclosed shoe gear. Pain is relieved with periodic professional debridement.   He is accompanied by his sister on today's visit. He voices no new pedal problems on today's visit.  Allergies  Allergen Reactions  . No Known Allergies     Objective: There were no vitals filed for this visit.  Pt 74 y.o. year old AA male in NAD. AAO x 3.   Vascular Examination:  Neurovascular status unchanged b/l lower extremities. Capillary refill time to digits immediate b/l. Palpable pedal pulses b/l LE. Pedal hair absent. Lower extremity skin temperature gradient within normal limits. Trace edema noted b/l lower extremities.  Dermatological Examination: No open wounds bilaterally. No interdigital macerations bilaterally. Toenails 1-5 b/l elongated, dystrophic, thickened, crumbly with subungual debris and tenderness to dorsal palpation. Hyperkeratotic lesion(s) L hallux and R hallux.  No erythema, no edema, no drainage, no flocculence.  No open wounds noted. Resolved tinea pedis b/l.  No interdigital macerations.  No blisters, no weeping. No signs of secondary bacterial infection noted.  Musculoskeletal: Normal muscle strength 5/5 to all lower extremity muscle groups bilaterally, no pain crepitus or joint limitation noted with ROM b/l and bunion deformity noted b/l  Neurological: Protective sensation intact 5/5 intact bilaterally with 10g monofilament b/l Vibratory sensation intact b/l  Assessment: 1. Pain due to onychomycosis of toenails of both feet   2. Callus    Plan: -Toenails 1-5 b/l were debrided in length and girth with sterile nail nippers and dremel without iatrogenic bleeding.  -Callus(es) L hallux and R hallux were debrided without complication or incident. Total number debrided =2. -Patient to continue soft,  supportive shoe gear daily. -Patient to report any pedal injuries to medical professional immediately. -Patient/POA to call should there be question/concern in the interim.  Return in about 3 months (around 07/14/2020) for nail trim.

## 2020-07-08 DIAGNOSIS — M1712 Unilateral primary osteoarthritis, left knee: Secondary | ICD-10-CM | POA: Insufficient documentation

## 2020-07-20 ENCOUNTER — Encounter: Payer: Self-pay | Admitting: Podiatry

## 2020-07-20 ENCOUNTER — Other Ambulatory Visit: Payer: Self-pay

## 2020-07-20 ENCOUNTER — Ambulatory Visit (INDEPENDENT_AMBULATORY_CARE_PROVIDER_SITE_OTHER): Payer: Medicare Other | Admitting: Podiatry

## 2020-07-20 DIAGNOSIS — L84 Corns and callosities: Secondary | ICD-10-CM | POA: Diagnosis not present

## 2020-07-20 DIAGNOSIS — M79674 Pain in right toe(s): Secondary | ICD-10-CM

## 2020-07-20 DIAGNOSIS — B351 Tinea unguium: Secondary | ICD-10-CM

## 2020-07-20 DIAGNOSIS — M79675 Pain in left toe(s): Secondary | ICD-10-CM | POA: Diagnosis not present

## 2020-07-24 NOTE — Progress Notes (Signed)
Subjective: Brandon [name] presents today for follow up of painful mycotic nails b/l that are difficult to trim. Pain interferes with ambulation. Aggravating factors include wearing enclosed shoe gear. Pain is relieved with periodic professional debridement.   He states he has been taking better care of his feet.   Allergies  Allergen Reactions   No Known Allergies     Objective: There were no vitals filed for this visit.  Pt 74 y.o. year old AA male in NAD. AAO x 3.   Vascular Examination:  Neurovascular status unchanged b/l lower extremities. Capillary refill time to digits immediate b/l. Palpable pedal pulses b/l LE. Pedal hair absent. Lower extremity skin temperature gradient within normal limits. Trace edema noted b/l lower extremities.  Dermatological Examination: No open wounds bilaterally. No interdigital macerations bilaterally. Toenails 1-5 b/l elongated, dystrophic, thickened, crumbly with subungual debris and tenderness to dorsal palpation. Hyperkeratotic lesion(s) 1st metatarsal heads b/l.  No erythema, no edema, no drainage, no fluctuance.  No open wounds noted. Improved pedal hygiene noted.  Musculoskeletal: Normal muscle strength 5/5 to all lower extremity muscle groups bilaterally, no pain crepitus or joint limitation noted with ROM b/l and bunion deformity noted b/l  Neurological: Protective sensation intact 5/5 intact bilaterally with 10g monofilament b/l Vibratory sensation intact b/l  Assessment: 1. Pain due to onychomycosis of toenails of both feet   2. Callus     Plan: -Toenails 1-5 b/l were debrided in length and girth with sterile nail nippers and dremel without iatrogenic bleeding.  -Callus(es) 1st met head b/l were debrided without complication or incident. Total number debrided =2. -Patient to continue soft, supportive shoe gear daily. -Patient to report any pedal injuries to medical professional immediately. -Patient/POA to call should there be  question/concern in the interim.  Return in about 3 months (around 10/20/2020).

## 2020-07-31 ENCOUNTER — Telehealth: Payer: Self-pay

## 2020-07-31 NOTE — Telephone Encounter (Signed)
NOTES ON FILE FROM [facility] [phone], SENT REFERRAL TO SCHEDULING 

## 2020-08-09 NOTE — Progress Notes (Signed)
Cardiology Office Note:    Date:  08/11/2020   ID:  Brandon [name], DOB 1946/07/03, MRN [id number]  PCP:  Karl Ito, DO  [facility] Cardiologist:  Meriam Sprague, MD  [facility] Electrophysiologist:  None   Referring MD: Raymon Mutton., FNP    History of Present Illness:    Brandon [name] is a 74 y.o. male with a hx of chronic venous insufficiency, HTN and shizophrenia who was referred by Rozetta Nunnery, FNP for management of hypertension and LE edema.  Patient states that he feels overall okay. Has been having LE edema and was previously on lasix which was stopped due to rising Cr (currently 1.46). No echo in our system. States he feels SOB with exertion and is unable to walk from kitchen to bedroom with stopping. No chest pain, orthopnea, PND, palpitations. No syncope, dizziness, nausea or vomiting. Has never had stress test. No known CAD that he knows of. Does not monitor blood pressure at home but believes it has been okay at his office visits with his primary care physician. No known CAD or stroke.   Past Medical History:  Diagnosis Date  . Allergy   . Arthritis   . Bilateral knee swelling   . Glaucoma   . Hypertension   . Prostate disorder   . Schizophrenia (HCC)   . Weakness of lower extremity    left knee, uses walker    Past Surgical History:  Procedure Laterality Date  . ANKLE SURGERY  1997   had broken right ankle/had 3 surgeries in 1996/ has pins and rods    Current Medications: Current Meds  Medication Sig  . aspirin EC 81 MG tablet Take 81 mg by mouth daily.  Marland Kitchen atorvastatin (LIPITOR) 20 MG tablet atorvastatin 20 mg tablet  TAKE 1 TABLET BY MOUTH EVERY DAY  . buPROPion (WELLBUTRIN SR) 150 MG 12 hr tablet   . finasteride (PROSCAR) 5 MG tablet Take 5 mg by mouth daily.  Marland Kitchen ipratropium (ATROVENT) 0.03 % nasal spray ipratropium bromide 0.03 % nasal spray  . losartan (COZAAR) 50 MG tablet Take 50 mg by mouth daily.  Marland Kitchen omeprazole (PRILOSEC)  20 MG capsule Take 20 mg by mouth daily.  Marland Kitchen oxybutynin (DITROPAN-XL) 5 MG 24 hr tablet Take 5 mg by mouth daily.  . tamsulosin (FLOMAX) 0.4 MG CAPS capsule Take 0.4 mg by mouth daily.  . Travoprost, BAK Free, (TRAVATAN) 0.004 % SOLN ophthalmic solution INSTILL 1 DROP INTO BOTH EYES EVERY DAY AT NIGHT  . trifluoperazine (STELAZINE) 5 MG tablet Take 5 mg by mouth at bedtime.     Allergies:   No known allergies   Social History   Socioeconomic History  . Marital status: Single    Spouse name: Not on file  . Number of children: Not on file  . Years of education: Not on file  . Highest education level: Not on file  Occupational History  . Not on file  Tobacco Use  . Smoking status: Never Smoker  . Smokeless tobacco: Never Used  Substance and Sexual Activity  . Alcohol use: No  . Drug use: No  . Sexual activity: Not on file  Other Topics Concern  . Not on file  Social History Narrative  . Not on file   Social Determinants of Health   Financial Resource Strain:   . Difficulty of Paying Living Expenses: Not on file  Food Insecurity:   . Worried About Programme researcher, broadcasting/film/video in  the Last Year: Not on file  . Ran Out of Food in the Last Year: Not on file  Transportation Needs:   . Lack of Transportation (Medical): Not on file  . Lack of Transportation (Non-Medical): Not on file  Physical Activity:   . Days of Exercise per Week: Not on file  . Minutes of Exercise per Session: Not on file  Stress:   . Feeling of Stress : Not on file  Social Connections:   . Frequency of Communication with Friends and Family: Not on file  . Frequency of Social Gatherings with Friends and Family: Not on file  . Attends Religious Services: Not on file  . Active Member of Clubs or Organizations: Not on file  . Attends Banker Meetings: Not on file  . Marital Status: Not on file     Family History: The patient's family history includes Breast cancer in his father and mother; Kidney  disease in his mother; Leukemia in his father.  ROS:   Please see the history of present illness.    Review of Systems  Constitutional: Negative for chills, fever and malaise/fatigue.  HENT: Negative for sore throat.   Eyes: Negative for blurred vision.  Respiratory: Positive for shortness of breath.   Cardiovascular: Positive for leg swelling. Negative for chest pain, palpitations, orthopnea and PND.  Gastrointestinal: Negative for abdominal pain, blood in stool, nausea and vomiting.  Genitourinary: Negative for hematuria.  Musculoskeletal: Positive for joint pain.  Neurological: Negative for loss of consciousness and weakness.  Psychiatric/Behavioral: Negative for depression.    EKGs/Labs/Other Studies Reviewed:    The following studies were reviewed today: No records in our system  EKG:  EKG is  ordered today.  The ekg ordered today demonstrates Sinus tachycardia with HR 113, borderline RAD, LVH  Recent Labs: No results found for requested labs within last 8760 hours.  Recent Lipid Panel No results found for: CHOL, TRIG, HDL, CHOLHDL, VLDL, LDLCALC, LDLDIRECT    Physical Exam:    VS:  BP 132/80   Pulse (!) 113   Ht 6' (1.829 m)   Wt 209 lb 12.8 oz (95.2 kg)   SpO2 98%   BMI 28.45 kg/m     Wt Readings from Last 3 Encounters:  08/11/20 209 lb 12.8 oz (95.2 kg)  02/04/19 209 lb (94.8 kg)  05/10/18 204 lb 9.6 oz (92.8 kg)     GEN: Comfortable, SOB with minimal exertion HEENT: Normal NECK: No JVD; No carotid bruits CARDIAC: Tachycardic, regular, no murmurs RESPIRATORY:  Clear to auscultation without rales, wheezing or rhonchi  ABDOMEN: Soft, non-tender, non-distended MUSCULOSKELETAL:  Warm, 2+ pitting edema to mid-shin  SKIN: Warm and dry NEUROLOGIC:  Alert and oriented x 3 PSYCHIATRIC:  Normal affect   ASSESSMENT:    1. Precordial pain   2. Shortness of breath    PLAN:    In order of problems listed above:  #Dyspnea on Exertion: Patient with  significant DOE when ambulating from his bedroom to the kitchen requiring him to sit down to rest. No associated chest pain, n/v, diaphoresis or palpitations. No prior ischemic work-up or TTE in our system. Concern for anginal equivalent. Will proceed with stress testing and echo. -Check myoview -Check TTE -Will keep BP log x5 days and send in results  #LE edema: #CKD: Patient with 2+ pitting edema to the shin bilaterally with Cr 1.4. Was previously on lasix but stopped due to worsening renal function. Has not seen Nephrology. No TTE in  our system. -Check TTE -Okay to hold lasix for now; will determine dosing pending TTE findings -Will refer to nephrology pending TTE findings above  #Hypertension: Managed by PCP. -Check blood pressure log x 5days and send results to me -Continue losartan 50mg  daily  #HLD: Managed by PCP. LDL 88, HDL 62, TC 163 on -Continue atorvastatin 20mg  daily   Medication Adjustments/Labs and Tests Ordered: Current medicines are reviewed at length with the patient today.  Concerns regarding medicines are outlined above.  Orders Placed This Encounter  Procedures  . MYOCARDIAL PERFUSION IMAGING  . EKG 12-Lead  . ECHOCARDIOGRAM COMPLETE   No orders of the defined types were placed in this encounter.   Patient Instructions  Medication Instructions:  Your physician recommends that you continue on your current medications as directed. Please refer to the Current Medication list given to you today.  *If you need a refill on your cardiac medications before your next appointment, please call your pharmacy*  Testing/Procedures: Your physician has requested that you have an echocardiogram. Echocardiography is a painless test that uses sound waves to create images of your heart. It provides your doctor with information about the size and shape of your heart and how well your heart's chambers and valves are working. This procedure takes approximately one  hour. There are no restrictions for this procedure.  Your physician has requested that you have a lexiscan myoview. For further information please visit [phone]. Please follow instruction sheet, as given.   Follow-Up: At [facility], you and your health needs are our priority.  As part of our continuing mission to provide you with exceptional heart care, we have created designated Provider Care Teams.  These Care Teams include your primary Cardiologist (physician) and Advanced Practice Providers (APPs -  Physician Assistants and Nurse Practitioners) who all work together to provide you with the care you need, when you need it.  We recommend signing up for the patient portal called "MyChart".  Sign up information is provided on this After Visit Summary.  MyChart is used to connect with patients for Virtual Visits (Telemedicine).  Patients are able to view lab/test results, encounter notes, upcoming appointments, etc.  Non-urgent messages can be sent to your provider as well.   To learn more about what you can do with MyChart, go to [email or web address]    Your next appointment:   1 month(s)  The format for your next appointment:   In Person  Provider:   You may see [facility], MD or one of the following Advanced Practice Providers on your designated Care Team:    [email or web address], PA-C  Meriam Sprague, Tereso Newcomer       Signed, Chelsea Aus, MD  08/11/2020 5:10 PM    [facility]

## 2020-08-11 ENCOUNTER — Ambulatory Visit: Payer: Medicare Other | Admitting: Cardiology

## 2020-08-11 ENCOUNTER — Encounter: Payer: Self-pay | Admitting: Cardiology

## 2020-08-11 ENCOUNTER — Other Ambulatory Visit: Payer: Self-pay

## 2020-08-11 VITALS — BP 132/80 | HR 113 | Ht 72.0 in | Wt 209.8 lb

## 2020-08-11 DIAGNOSIS — R0602 Shortness of breath: Secondary | ICD-10-CM

## 2020-08-11 DIAGNOSIS — R0609 Other forms of dyspnea: Secondary | ICD-10-CM

## 2020-08-11 DIAGNOSIS — R072 Precordial pain: Secondary | ICD-10-CM

## 2020-08-11 DIAGNOSIS — R0600 Dyspnea, unspecified: Secondary | ICD-10-CM | POA: Diagnosis not present

## 2020-08-11 DIAGNOSIS — R600 Localized edema: Secondary | ICD-10-CM | POA: Diagnosis not present

## 2020-08-11 DIAGNOSIS — I10 Essential (primary) hypertension: Secondary | ICD-10-CM

## 2020-08-11 NOTE — Patient Instructions (Addendum)
Medication Instructions:  Your physician recommends that you continue on your current medications as directed. Please refer to the Current Medication list given to you today.  *If you need a refill on your cardiac medications before your next appointment, please call your pharmacy*  Testing/Procedures: Your physician has requested that you have an echocardiogram. Echocardiography is a painless test that uses sound waves to create images of your heart. It provides your doctor with information about the size and shape of your heart and how well your heart's chambers and valves are working. This procedure takes approximately one hour. There are no restrictions for this procedure.  Your physician has requested that you have a lexiscan myoview. For further information please visit [email or web address] Please follow instruction sheet, as given.   Follow-Up: At [facility], you and your health needs are our priority.  As part of our continuing mission to provide you with exceptional heart care, we have created designated Provider Care Teams.  These Care Teams include your primary Cardiologist (physician) and Advanced Practice Providers (APPs -  Physician Assistants and Nurse Practitioners) who all work together to provide you with the care you need, when you need it.  We recommend signing up for the patient portal called "MyChart".  Sign up information is provided on this After Visit Summary.  MyChart is used to connect with patients for Virtual Visits (Telemedicine).  Patients are able to view lab/test results, encounter notes, upcoming appointments, etc.  Non-urgent messages can be sent to your provider as well.   To learn more about what you can do with MyChart, go to [email or web address].    Your next appointment:   1 month(s)  The format for your next appointment:   In Person  Provider:   You may see Meriam Sprague, MD or one of the following Advanced Practice Providers on your  designated Care Team:    Tereso Newcomer, PA-C  [place], [place]

## 2020-08-14 ENCOUNTER — Telehealth: Payer: Self-pay | Admitting: Cardiology

## 2020-08-14 NOTE — Telephone Encounter (Signed)
Spoke with the patient's sister who states that she would like for her brother to be referred to [facility]. I advised her that Dr. Shari Prows is waiting for him to have his echo to see if referral is needed and if so then we can send referral to [facility]. Patient's sister verbalized understanding.

## 2020-08-14 NOTE — Telephone Encounter (Signed)
Patient's sister calling because she states that Dr. Shari Prows was going to send a referral to a kidney specialist for her brother. She wants to know if the referral can go to [facility]. Please advise.

## 2020-09-02 ENCOUNTER — Telehealth (HOSPITAL_COMMUNITY): Payer: Self-pay | Admitting: *Deleted

## 2020-09-02 NOTE — Telephone Encounter (Signed)
Patient's sister given detailed instructions per Myocardial Perfusion Study Information Sheet for the test on 09/07/20 at 10:45. Patient notified to arrive 15 minutes early and that it is imperative to arrive on time for appointment to keep from having the test rescheduled. If you need to cancel or reschedule your appointment, please call the office within 24 hours of your appointment. . Patient's verbalized understanding.Daneil Dolin

## 2020-09-07 ENCOUNTER — Encounter (HOSPITAL_COMMUNITY): Payer: Medicare Other

## 2020-09-07 ENCOUNTER — Other Ambulatory Visit (HOSPITAL_COMMUNITY): Payer: Medicare Other

## 2020-09-22 ENCOUNTER — Encounter (HOSPITAL_COMMUNITY): Payer: Medicare Other

## 2020-09-28 ENCOUNTER — Other Ambulatory Visit: Payer: Self-pay | Admitting: Cardiology

## 2020-09-28 ENCOUNTER — Telehealth (HOSPITAL_COMMUNITY): Payer: Self-pay

## 2020-09-28 DIAGNOSIS — R0609 Other forms of dyspnea: Secondary | ICD-10-CM

## 2020-09-29 ENCOUNTER — Telehealth (HOSPITAL_COMMUNITY): Payer: Self-pay | Admitting: *Deleted

## 2020-09-29 NOTE — Telephone Encounter (Signed)
Patient had to reschedule due to a death in the family.  Rescheduled for 10/07/20 @ 10:15 and echo at 11:30.  Instructions gone over with his sister.

## 2020-09-29 NOTE — Telephone Encounter (Signed)
Left message on voicemail per DPR in reference to upcoming appointment scheduled on 10/01/20 at 10:45 with detailed instructions given per Myocardial Perfusion Study Information Sheet for the test. LM to arrive 15 minutes early, and that it is imperative to arrive on time for appointment to keep from having the test rescheduled. If you need to cancel or reschedule your appointment, please call the office within 24 hours of your appointment. Failure to do so may result in a cancellation of your appointment, and a $50 no show fee. Phone number given for call back for any questions.

## 2020-10-01 ENCOUNTER — Other Ambulatory Visit (HOSPITAL_COMMUNITY): Payer: Medicare Other

## 2020-10-01 ENCOUNTER — Encounter (HOSPITAL_COMMUNITY): Payer: Medicare Other

## 2020-10-07 ENCOUNTER — Other Ambulatory Visit: Payer: Self-pay

## 2020-10-07 ENCOUNTER — Ambulatory Visit (HOSPITAL_COMMUNITY): Payer: Medicare Other | Attending: Cardiology

## 2020-10-07 ENCOUNTER — Ambulatory Visit (HOSPITAL_BASED_OUTPATIENT_CLINIC_OR_DEPARTMENT_OTHER): Payer: Medicare Other

## 2020-10-07 DIAGNOSIS — R0602 Shortness of breath: Secondary | ICD-10-CM

## 2020-10-07 LAB — ECHOCARDIOGRAM COMPLETE
Area-P 1/2: 5.97 cm2
Height: 72 in
S' Lateral: 3.3 cm
Weight: 3344 oz

## 2020-10-07 LAB — MYOCARDIAL PERFUSION IMAGING
LV dias vol: 92 mL (ref 62–150)
LV sys vol: 38 mL
Peak HR: 130 {beats}/min
Rest HR: 100 {beats}/min
SDS: 2
SRS: 0
SSS: 2
TID: 0.88

## 2020-10-07 MED ORDER — TECHNETIUM TC 99M TETROFOSMIN IV KIT
30.7000 | PACK | Freq: Once | INTRAVENOUS | Status: AC | PRN
  Administered 2020-10-07: 12:00:00 30.7 via INTRAVENOUS
  Filled 2020-10-07: qty 31

## 2020-10-07 MED ORDER — TECHNETIUM TC 99M TETROFOSMIN IV KIT
10.8000 | PACK | Freq: Once | INTRAVENOUS | Status: AC | PRN
  Administered 2020-10-07: 10.8 via INTRAVENOUS
  Filled 2020-10-07: qty 11

## 2020-10-07 MED ORDER — REGADENOSON 0.4 MG/5ML IV SOLN
0.4000 mg | Freq: Once | INTRAVENOUS | Status: AC
  Administered 2020-10-07: 0.4 mg via INTRAVENOUS

## 2020-10-21 ENCOUNTER — Ambulatory Visit (INDEPENDENT_AMBULATORY_CARE_PROVIDER_SITE_OTHER): Payer: Medicare Other | Admitting: Podiatry

## 2020-10-21 ENCOUNTER — Other Ambulatory Visit: Payer: Self-pay

## 2020-10-21 ENCOUNTER — Encounter: Payer: Self-pay | Admitting: Podiatry

## 2020-10-21 DIAGNOSIS — B351 Tinea unguium: Secondary | ICD-10-CM

## 2020-10-21 DIAGNOSIS — M79675 Pain in left toe(s): Secondary | ICD-10-CM

## 2020-10-21 DIAGNOSIS — M79674 Pain in right toe(s): Secondary | ICD-10-CM | POA: Diagnosis not present

## 2020-10-22 NOTE — Progress Notes (Signed)
Subjective: Brandon [name] presents today for follow up of painful mycotic nails b/l that are difficult to trim. Pain interferes with ambulation. Aggravating factors include wearing enclosed shoe gear. Pain is relieved with periodic professional debridement.   He voices no new pedal concerns on today's visit. He is excited he has a birthday coming up in February.  Allergies  Allergen Reactions  . No Known Allergies     Objective: There were no vitals filed for this visit.  Pt 75 y.o. year old AA male in NAD. AAO x 3.   Vascular Examination:  Neurovascular status unchanged b/l lower extremities. Capillary refill time to digits immediate b/l. Palpable pedal pulses b/l LE. Pedal hair absent. Lower extremity skin temperature gradient within normal limits. Trace edema noted b/l lower extremities.  Dermatological Examination: No open wounds bilaterally. No interdigital macerations bilaterally. Toenails 1-5 b/l elongated, dystrophic, thickened, crumbly with subungual debris and tenderness to dorsal palpation. Improved pedal hygiene noted.  Musculoskeletal: Normal muscle strength 5/5 to all lower extremity muscle groups bilaterally, no pain crepitus or joint limitation noted with ROM b/l and bunion deformity noted b/l  Neurological: Protective sensation intact 5/5 intact bilaterally with 10g monofilament b/l Vibratory sensation intact b/l  Assessment: 1. Pain due to onychomycosis of toenails of both feet     Plan: -Toenails 1-5 b/l were debrided in length and girth with sterile nail nippers and dremel without iatrogenic bleeding.  -Patient to continue soft, supportive shoe gear daily. -Patient to report any pedal injuries to medical professional immediately. -Patient/POA to call should there be question/concern in the interim.  Return in about 3 months (around 01/19/2021).

## 2020-10-27 ENCOUNTER — Encounter: Payer: Self-pay | Admitting: Physician Assistant

## 2020-10-27 ENCOUNTER — Telehealth (INDEPENDENT_AMBULATORY_CARE_PROVIDER_SITE_OTHER): Payer: Medicare Other | Admitting: Physician Assistant

## 2020-10-27 ENCOUNTER — Other Ambulatory Visit: Payer: Self-pay

## 2020-10-27 VITALS — Ht 72.0 in | Wt 210.0 lb

## 2020-10-27 DIAGNOSIS — N1831 Chronic kidney disease, stage 3a: Secondary | ICD-10-CM | POA: Diagnosis not present

## 2020-10-27 DIAGNOSIS — I10 Essential (primary) hypertension: Secondary | ICD-10-CM

## 2020-10-27 DIAGNOSIS — R0609 Other forms of dyspnea: Secondary | ICD-10-CM

## 2020-10-27 DIAGNOSIS — I872 Venous insufficiency (chronic) (peripheral): Secondary | ICD-10-CM | POA: Diagnosis not present

## 2020-10-27 DIAGNOSIS — R0600 Dyspnea, unspecified: Secondary | ICD-10-CM

## 2020-10-27 NOTE — Patient Instructions (Signed)
Medication Instructions:  Your physician has recommended you make the following change in your medication:   1) Ok to take Furosemide as needed for increased swelling. Do not take more than 2 days in a row.   *If you need a refill on your cardiac medications before your next appointment, please call your pharmacy*  Lab Work: None ordered today  Testing/Procedures: None ordered today  Follow-Up: At [facility], you and your health needs are our priority.  As part of our continuing mission to provide you with exceptional heart care, we have created designated Provider Care Teams.  These Care Teams include your primary Cardiologist (physician) and Advanced Practice Providers (APPs -  Physician Assistants and Nurse Practitioners) who all work together to provide you with the care you need, when you need it.  Your next appointment:   6 month(s)  The format for your next appointment:   In Person  Provider:   Laurance Flatten, MD

## 2020-10-27 NOTE — Progress Notes (Signed)
Virtual Visit via Video Note   This visit type was conducted due to national recommendations for restrictions regarding the COVID-19 Pandemic (e.g. social distancing) in an effort to limit this patient's exposure and mitigate transmission in our community.  Due to his co-morbid illnesses, this patient is at least at moderate risk for complications without adequate follow up.  This format is felt to be most appropriate for this patient at this time.  All issues noted in this document were discussed and addressed.  A limited physical exam was performed with this format.  Please refer to the patient's chart for his consent to telehealth for [facility]       Date:  10/27/2020   ID:  Brandon Brandon [name] Brandon [name], DOB [birth year], MRN [id number] The patient was identified using 2 identifiers.  Patient Location: Home Provider Location: Office/Clinic  PCP:  Karl Ito, DO  Cardiologist:  Meriam Sprague, MD   Electrophysiologist:  None   Evaluation Performed:  Follow-Up Visit  Chief Complaint:  Follow-up (Edema, dyspnea)    Patient Profile: Brandon Brandon [name] Brandon [name] is a 75 y.o. male with:  Venous insufficiency   Hypertension   Schizophrenia   Hyperlipidemia   Chronic kidney disease  Prior CV Studies: Myoview 10/07/20 EF 59, no ischemia or infarction, low risk   Echocardiogram  10/07/20 EF 60-65, no RWMA, Gr 1 DD, GLS -18.0%, normal RVSF, trivial MR   History of Present Illness:   Brandon Brandon [name] Brandon [name] was evaluated by Dr. Shari Prows in 07/2020 for leg edema and shortness of breath.  An echocardiogram demonstrated normal EF and a Myoview was low risk.  Notes indicate a referral to Neprhology I sbeing considered.  His sister is present for the video visit today.  We discussed the results of his echocardiogram and Myoview. He continues to have leg edema. This seems to improve with elevation. He also wears compression hose. He continues to have shortness of breath with exertion. His sister notes that  he is fairly sedentary. He has not had chest pain, syncope, orthopnea.   Past Medical History:  Diagnosis Date  . Allergy   . Arthritis   . Bilateral knee swelling   . Glaucoma   . Hypertension   . Prostate disorder   . Schizophrenia (HCC)   . Weakness of lower extremity    left knee, uses walker   Past Surgical History:  Procedure Laterality Date  . ANKLE SURGERY  1997   had broken right ankle/had 3 surgeries in 1996/ has pins and rods     Current Meds  Medication Sig  . aspirin EC 81 MG tablet Take 81 mg by mouth daily.  Marland Kitchen atorvastatin (LIPITOR) 20 MG tablet Take 20 mg by mouth daily.  Marland Kitchen buPROPion (WELLBUTRIN SR) 150 MG 12 hr tablet Take 150 mg by mouth daily.  . finasteride (PROSCAR) 5 MG tablet Take 5 mg by mouth daily.  . fluticasone (FLONASE) 50 MCG/ACT nasal spray Place 1-2 sprays into both nostrils daily.  Marland Kitchen losartan (COZAAR) 50 MG tablet Take 50 mg by mouth daily.  Marland Kitchen omeprazole (PRILOSEC) 20 MG capsule Take 20 mg by mouth daily.  Marland Kitchen oxybutynin (DITROPAN-XL) 5 MG 24 hr tablet Take 5 mg by mouth daily.  . tamsulosin (FLOMAX) 0.4 MG CAPS capsule Take 0.4 mg by mouth daily.  . Travoprost, BAK Free, (TRAVATAN) 0.004 % SOLN ophthalmic solution INSTILL 1 DROP INTO BOTH EYES EVERY DAY AT NIGHT  . trifluoperazine (STELAZINE) 5 MG tablet Take 5 mg by mouth at bedtime.  Allergies:   No known allergies   Social History   Tobacco Use  . Smoking status: Never Smoker  . Smokeless tobacco: Never Used  Substance Use Topics  . Alcohol use: No  . Drug use: No     Family Hx: The patient's family history includes Breast cancer in his father and mother; Kidney disease in his mother; Leukemia in his father.  ROS:   Please see the history of present illness.      Labs/Other Tests and Data Reviewed:    EKG:  No ECG reviewed.  Recent Labs: No results found for requested labs within last 8760 hours.   Recent Lipid Panel No results found for: CHOL, TRIG, HDL, CHOLHDL,  LDLCALC, LDLDIRECT  Wt Readings from Last 3 Encounters:  10/27/20 210 lb (95.3 kg)  10/07/20 209 lb (94.8 kg)  08/11/20 209 lb 12.8 oz (95.2 kg)     Risk Assessment/Calculations:      Objective:    Vital Signs:  Ht 6' (1.829 m)   Wt 210 lb (95.3 kg)   BMI 28.48 kg/m    VITAL SIGNS:  reviewed GEN:  no acute distress PSYCH:  normal affect  ASSESSMENT & PLAN:    1. Chronic venous insufficiency As noted, we reviewed the findings on his echocardiogram and stress test. It sounds as though his leg edema is related to venous insufficiency. His edema improves with elevation. It's worse throughout the day. I have encouraged him to continue to wear compression hose. I advised him that he could take furosemide as needed but should not take it more than 2 days in a row.  2. Dyspnea on exertion His echocardiogram demonstrated normal LV function and his Myoview demonstrated no ischemia. I suspect his dyspnea is related to deconditioning and have encouraged him to pursue a walking program. If he continues to have shortness of breath exertion, he should follow-up with primary care to evaluate other causes for his dyspnea.  3. Essential hypertension Continue current dose of losartan. I have encouraged him to continue to monitor his blood pressure and let us or his primary care doctor know if his blood pressure is >130/80.  4. Chronic kidney disease His primary care doctor has referred him to nephrology. His appointment is pending.     Time:   Today, I have spent 18 minutes with the patient with telehealth technology discussing the above problems.     Medication Adjustments/Labs and Tests Ordered: Current medicines are reviewed at length with the patient today.  Concerns regarding medicines are outlined above.   Tests Ordered: No orders of the defined types were placed in this encounter.   Medication Changes: No orders of the defined types were placed in this encounter.   Follow Up:   In Person in 6 month(s)  Signed, Tereso Newcomer, PA-C  10/27/2020 4:58 PM    [facility]

## 2020-11-20 ENCOUNTER — Other Ambulatory Visit: Payer: Self-pay | Admitting: Nephrology

## 2020-11-20 DIAGNOSIS — N1831 Chronic kidney disease, stage 3a: Secondary | ICD-10-CM

## 2021-01-20 ENCOUNTER — Ambulatory Visit: Payer: Medicare Other | Admitting: Podiatry

## 2021-02-19 ENCOUNTER — Ambulatory Visit
Admission: RE | Admit: 2021-02-19 | Discharge: 2021-02-19 | Disposition: A | Discharge status: Home / Self Care | Payer: Medicare Other | Source: Ambulatory Visit | Attending: Nephrology | Admitting: Nephrology

## 2021-02-19 DIAGNOSIS — N1831 Chronic kidney disease, stage 3a: Secondary | ICD-10-CM

## 2021-03-11 ENCOUNTER — Ambulatory Visit: Payer: Medicare Other | Admitting: Sports Medicine

## 2021-03-25 ENCOUNTER — Encounter: Payer: Self-pay | Admitting: Sports Medicine

## 2021-03-25 ENCOUNTER — Other Ambulatory Visit: Payer: Self-pay

## 2021-03-25 ENCOUNTER — Ambulatory Visit (INDEPENDENT_AMBULATORY_CARE_PROVIDER_SITE_OTHER): Payer: Medicare Other | Admitting: Sports Medicine

## 2021-03-25 DIAGNOSIS — M79675 Pain in left toe(s): Secondary | ICD-10-CM

## 2021-03-25 DIAGNOSIS — B351 Tinea unguium: Secondary | ICD-10-CM | POA: Diagnosis not present

## 2021-03-25 DIAGNOSIS — I872 Venous insufficiency (chronic) (peripheral): Secondary | ICD-10-CM

## 2021-03-25 DIAGNOSIS — L84 Corns and callosities: Secondary | ICD-10-CM

## 2021-03-25 DIAGNOSIS — M79674 Pain in right toe(s): Secondary | ICD-10-CM

## 2021-03-25 DIAGNOSIS — B353 Tinea pedis: Secondary | ICD-10-CM

## 2021-03-25 DIAGNOSIS — I890 Lymphedema, not elsewhere classified: Secondary | ICD-10-CM

## 2021-03-25 NOTE — Patient Instructions (Signed)
Tinactin or Lamisil spray with cooling effect 

## 2021-03-25 NOTE — Progress Notes (Signed)
Subjective: Brandon [name] is a 75 y.o. male patient seen today in office with complaint of mildly painful thickened and elongated toenails; unable to trim. Patient has a history of lymphedema. Patient has no other pedal complaints at this time.   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     atorvastatin (LIPITOR) 20 MG tablet Take 20 mg by mouth daily.     buPROPion (WELLBUTRIN SR) 150 MG 12 hr tablet Take 150 mg by mouth daily.     finasteride (PROSCAR) 5 MG tablet Take 5 mg by mouth daily.     fluticasone (FLONASE) 50 MCG/ACT nasal spray Place 1-2 sprays into both nostrils daily.     losartan (COZAAR) 50 MG tablet Take 50 mg by mouth daily.     omeprazole (PRILOSEC) 20 MG capsule Take 20 mg by mouth daily.     oxybutynin (DITROPAN-XL) 5 MG 24 hr tablet Take 5 mg by mouth daily.     tamsulosin (FLOMAX) 0.4 MG CAPS capsule Take 0.4 mg by mouth daily.     Travoprost, BAK Free, (TRAVATAN) 0.004 % SOLN ophthalmic solution INSTILL 1 DROP INTO BOTH EYES EVERY DAY AT NIGHT     trifluoperazine (STELAZINE) 5 MG tablet Take 5 mg by mouth at bedtime.     No current facility-administered medications on file prior to visit.    Allergies  Allergen Reactions   No Known Allergies     Objective: Physical Exam  General: Well developed, nourished, no acute distress, awake, alert and oriented x 3  Vascular: Dorsalis pedis artery 0/4 bilateral, Posterior tibial artery 0/4 bilateral, skin temperature warm to warm  proximal to distal bilateral lower extremities, no varicosities, no pedal hair present bilateral. + Chronic venous skin changes and lymphedema R>L.  Neurological: Gross sensation present via light touch bilateral.   Dermatological: Skin is warm, dry, and supple bilateral, Nails 1-10 are tender, long, thick, and discolored with mild subungal debris, no webspace macerations present bilateral, no open lesions present bilateral, + dry skin in mocassion distrubution present bilateral. No signs of infection bilateral.  Musculoskeletal: Asymptomatic Hammertoe and pes planus boney deformities noted bilateral. Muscular strength within normal limits without painon range of motion. No pain with calf compression bilateral.  Assessment and Plan:  Problem List Items Addressed This Visit       Cardiovascular and Mediastinum   Chronic venous insufficiency   Relevant Medications   furosemide (LASIX) 40 MG tablet   rosuvastatin (CRESTOR) 5 MG tablet     Musculoskeletal and Integument   Pain due to onychomycosis of toenails of both feet - Primary   Other Visit Diagnoses     Callus       Tinea pedis of both feet       Lymphedema             -Examined patient.  -Discussed treatment options for painful mycotic nails. -Mechanically debrided and reduced mycotic nails with sterile nail nipper and dremel nail file without incident. -Recommend  OTC lamisil/tinactin spray  -Patient to return in 3 months for follow up evaluation or sooner if symptoms worsen.  Asencion Islam, DPM

## 2021-04-13 DIAGNOSIS — M1711 Unilateral primary osteoarthritis, right knee: Secondary | ICD-10-CM | POA: Insufficient documentation

## 2021-04-30 ENCOUNTER — Encounter: Payer: Self-pay | Admitting: Gastroenterology

## 2021-05-05 ENCOUNTER — Encounter (HOSPITAL_BASED_OUTPATIENT_CLINIC_OR_DEPARTMENT_OTHER): Payer: Self-pay

## 2021-05-05 ENCOUNTER — Inpatient Hospital Stay (HOSPITAL_BASED_OUTPATIENT_CLINIC_OR_DEPARTMENT_OTHER)
Admission: EM | Admit: 2021-05-05 | Discharge: 2021-05-13 | DRG: 330 | Disposition: A | Discharge status: Skilled Nursing Facility | Payer: Medicare Other | Attending: Internal Medicine | Admitting: Internal Medicine

## 2021-05-05 ENCOUNTER — Other Ambulatory Visit: Payer: Self-pay

## 2021-05-05 ENCOUNTER — Emergency Department (HOSPITAL_BASED_OUTPATIENT_CLINIC_OR_DEPARTMENT_OTHER): Payer: Medicare Other

## 2021-05-05 ENCOUNTER — Ambulatory Visit (INDEPENDENT_AMBULATORY_CARE_PROVIDER_SITE_OTHER): Payer: Medicare Other

## 2021-05-05 ENCOUNTER — Ambulatory Visit (HOSPITAL_COMMUNITY): Payer: Medicare Other

## 2021-05-05 ENCOUNTER — Ambulatory Visit (HOSPITAL_COMMUNITY)
Admission: EM | Admit: 2021-05-05 | Discharge: 2021-05-05 | Disposition: A | Discharge status: Home / Self Care | Payer: Medicare Other | Attending: Internal Medicine | Admitting: Internal Medicine

## 2021-05-05 ENCOUNTER — Encounter (HOSPITAL_COMMUNITY): Payer: Self-pay | Admitting: *Deleted

## 2021-05-05 DIAGNOSIS — Z7982 Long term (current) use of aspirin: Secondary | ICD-10-CM

## 2021-05-05 DIAGNOSIS — E785 Hyperlipidemia, unspecified: Secondary | ICD-10-CM | POA: Diagnosis present

## 2021-05-05 DIAGNOSIS — Z20822 Contact with and (suspected) exposure to covid-19: Secondary | ICD-10-CM | POA: Diagnosis present

## 2021-05-05 DIAGNOSIS — R109 Unspecified abdominal pain: Secondary | ICD-10-CM | POA: Diagnosis not present

## 2021-05-05 DIAGNOSIS — I129 Hypertensive chronic kidney disease with stage 1 through stage 4 chronic kidney disease, or unspecified chronic kidney disease: Secondary | ICD-10-CM | POA: Diagnosis present

## 2021-05-05 DIAGNOSIS — K562 Volvulus: Principal | ICD-10-CM | POA: Diagnosis present

## 2021-05-05 DIAGNOSIS — N400 Enlarged prostate without lower urinary tract symptoms: Secondary | ICD-10-CM | POA: Diagnosis present

## 2021-05-05 DIAGNOSIS — I10 Essential (primary) hypertension: Secondary | ICD-10-CM | POA: Diagnosis not present

## 2021-05-05 DIAGNOSIS — N182 Chronic kidney disease, stage 2 (mild): Secondary | ICD-10-CM | POA: Diagnosis present

## 2021-05-05 DIAGNOSIS — K649 Unspecified hemorrhoids: Secondary | ICD-10-CM | POA: Diagnosis present

## 2021-05-05 DIAGNOSIS — D62 Acute posthemorrhagic anemia: Secondary | ICD-10-CM | POA: Diagnosis not present

## 2021-05-05 DIAGNOSIS — K5989 Other specified functional intestinal disorders: Secondary | ICD-10-CM | POA: Diagnosis not present

## 2021-05-05 DIAGNOSIS — Z79899 Other long term (current) drug therapy: Secondary | ICD-10-CM

## 2021-05-05 DIAGNOSIS — I878 Other specified disorders of veins: Secondary | ICD-10-CM | POA: Diagnosis present

## 2021-05-05 DIAGNOSIS — R112 Nausea with vomiting, unspecified: Secondary | ICD-10-CM | POA: Diagnosis not present

## 2021-05-05 DIAGNOSIS — R188 Other ascites: Secondary | ICD-10-CM | POA: Diagnosis present

## 2021-05-05 DIAGNOSIS — F209 Schizophrenia, unspecified: Secondary | ICD-10-CM | POA: Diagnosis present

## 2021-05-05 DIAGNOSIS — R1033 Periumbilical pain: Secondary | ICD-10-CM | POA: Diagnosis not present

## 2021-05-05 DIAGNOSIS — I872 Venous insufficiency (chronic) (peripheral): Secondary | ICD-10-CM | POA: Diagnosis present

## 2021-05-05 DIAGNOSIS — H9190 Unspecified hearing loss, unspecified ear: Secondary | ICD-10-CM | POA: Diagnosis present

## 2021-05-05 DIAGNOSIS — H409 Unspecified glaucoma: Secondary | ICD-10-CM | POA: Diagnosis present

## 2021-05-05 LAB — BASIC METABOLIC PANEL
Anion gap: 13 (ref 5–15)
BUN: 27 mg/dL — ABNORMAL HIGH (ref 8–23)
CO2: 25 mmol/L (ref 22–32)
Calcium: 9.7 mg/dL (ref 8.9–10.3)
Chloride: 100 mmol/L (ref 98–111)
Creatinine, Ser: 1.16 mg/dL (ref 0.61–1.24)
GFR, Estimated: >60 mL/min (ref >60)
Glucose, Bld: 127 mg/dL — ABNORMAL HIGH (ref 70–99)
Potassium: 3.4 mmol/L — ABNORMAL LOW (ref 3.5–5.1)
Sodium: 138 mmol/L (ref 135–145)

## 2021-05-05 LAB — CBC WITH DIFFERENTIAL/PLATELET
Abs Immature Granulocytes: 0.05 10*3/uL (ref 0.00–0.07)
Basophils Absolute: 0 10*3/uL (ref 0.0–0.1)
Basophils Relative: 0 %
Eosinophils Absolute: 0 10*3/uL (ref 0.0–0.5)
Eosinophils Relative: 0 %
HCT: 36.8 % — ABNORMAL LOW (ref 39.0–52.0)
Hemoglobin: 11.6 g/dL — ABNORMAL LOW (ref 13.0–17.0)
Immature Granulocytes: 1 %
Lymphocytes Relative: 7 %
Lymphs Abs: 0.8 10*3/uL (ref 0.7–4.0)
MCH: 26.8 pg (ref 26.0–34.0)
MCHC: 31.5 g/dL (ref 30.0–36.0)
MCV: 85 fL (ref 80.0–100.0)
Monocytes Absolute: 0.5 10*3/uL (ref 0.1–1.0)
Monocytes Relative: 5 %
Neutro Abs: 9.3 10*3/uL — ABNORMAL HIGH (ref 1.7–7.7)
Neutrophils Relative %: 87 %
Platelets: 170 10*3/uL (ref 150–400)
RBC: 4.33 MIL/uL (ref 4.22–5.81)
RDW: 16.1 % — ABNORMAL HIGH (ref 11.5–15.5)
WBC: 10.6 10*3/uL — ABNORMAL HIGH (ref 4.0–10.5)
nRBC: 0 % (ref 0.0–0.2)

## 2021-05-05 LAB — POCT URINALYSIS DIPSTICK, ED / UC
Bilirubin Urine: NEGATIVE
Glucose, UA: NEGATIVE mg/dL
Hgb urine dipstick: NEGATIVE
Ketones, ur: 15 mg/dL — AB
Leukocytes,Ua: NEGATIVE
Nitrite: NEGATIVE
Protein, ur: 30 mg/dL — AB
Specific Gravity, Urine: >=1.03 (ref 1.005–1.030)
Urobilinogen, UA: 0.2 mg/dL (ref 0.0–1.0)
pH: 5.5 (ref 5.0–8.0)

## 2021-05-05 MED ORDER — FENTANYL CITRATE (PF) 100 MCG/2ML IJ SOLN
25.0000 ug | Freq: Once | INTRAMUSCULAR | Status: AC
  Administered 2021-05-06: 25 ug via INTRAVENOUS
  Filled 2021-05-05: qty 2

## 2021-05-05 MED ORDER — ONDANSETRON HCL 4 MG/2ML IJ SOLN
4.0000 mg | Freq: Once | INTRAMUSCULAR | Status: AC
  Administered 2021-05-06: 4 mg via INTRAVENOUS
  Filled 2021-05-05: qty 2

## 2021-05-05 NOTE — ED Triage Notes (Signed)
Pt reports vomiting after a meal on Sunday . Pt reports vomiting this morning and ABD is bloating.

## 2021-05-05 NOTE — ED Provider Notes (Signed)
[facility] Provider Note: Lowella Dell, MD, FACEP  CSN: [id number] MRN: [id number] ARRIVAL: 05/05/21 at 2225 ROOM: DB013/DB013   CHIEF COMPLAINT  Abdominal Pain   HISTORY OF PRESENT ILLNESS  05/05/21 11:40 PM Brandon [name] is a 75 y.o. male with intermittent abdominal pain for the past 3 days.  He had some vomiting 3 days ago but none until today when the pain worsened and he began vomiting.  His pain is been intermittent and it is diffusely located.  He currently rates it as a 5 out of 10 and it is not worse with palpation.  He had a bowel movement today but describes it as thin and loose rather than formed.  He was seen at an urgent care and a plain x-ray of the abdomen which was suspicious for cecal volvulus.  He was sent to this facility for CT of the abdomen and pelvis.  His abdomen has been distended at times but he feels that it is not distended at the present time.  He states swelling in his lower legs is chronic and greater on the right.   Past Medical History:  Diagnosis Date   Allergy    Arthritis    Bilateral knee swelling    Glaucoma    Hypertension    Prostate disorder    Schizophrenia (HCC)    Weakness of lower extremity    left knee, uses walker    Past Surgical History:  Procedure Laterality Date   ANKLE SURGERY  1997   had broken right ankle/had 3 surgeries in 1996/ has pins and rods    Family History  Problem Relation Age of Onset   Kidney disease Mother    Breast cancer Mother    Breast cancer Father    Leukemia Father     Social History   Tobacco Use   Smoking status: Never   Smokeless tobacco: Never  Substance Use Topics   Alcohol use: No   Drug use: No    Prior to Admission medications   Medication Sig Start Date End Date Taking? Authorizing Provider  aspirin EC 81 MG tablet Take 81 mg by mouth daily.    [provider]  atorvastatin (LIPITOR) 20 MG tablet Take 20 mg by mouth daily.    [provider]   buPROPion (WELLBUTRIN SR) 150 MG 12 hr tablet Take 150 mg by mouth daily. 08/29/19   [provider]  finasteride (PROSCAR) 5 MG tablet Take 5 mg by mouth daily.    [provider]  fluticasone (FLONASE) 50 MCG/ACT nasal spray Place 1-2 sprays into both nostrils daily.    [provider]  furosemide (LASIX) 40 MG tablet Take 40 mg by mouth daily. 02/12/21   [provider]  losartan (COZAAR) 50 MG tablet Take 50 mg by mouth daily. 11/29/19   [provider]  omeprazole (PRILOSEC) 20 MG capsule Take 20 mg by mouth daily. 10/03/19   [provider]  oxybutynin (DITROPAN-XL) 5 MG 24 hr tablet Take 5 mg by mouth daily. 10/28/19   [provider]  rosuvastatin (CRESTOR) 5 MG tablet Take 5 mg by mouth at bedtime. 02/12/21   [provider]  tamsulosin (FLOMAX) 0.4 MG CAPS capsule Take 0.4 mg by mouth daily.    [provider]  Travoprost, BAK Free, (TRAVATAN) 0.004 % SOLN ophthalmic solution INSTILL 1 DROP INTO BOTH EYES EVERY DAY AT NIGHT 03/09/15   [provider]  trifluoperazine (STELAZINE) 5 MG tablet Take  5 mg by mouth at bedtime.    [provider]  Vitamin D, Ergocalciferol, (DRISDOL) 1.25 MG (50000 UNIT) CAPS capsule Take 1 capsule by mouth once a week. 02/12/21   [provider]    Allergies No known allergies   REVIEW OF SYSTEMS  Negative except as noted here or in the History of Present Illness.   PHYSICAL EXAMINATION  Initial Vital Signs Blood pressure (!) 164/89, pulse (!) 105, temperature 98.3 F (36.8 C), temperature source Oral, resp. rate 18, height 6\' 2"  (1.88 m), weight 99.8 kg, SpO2 100 %.  Examination General: Well-developed, well-nourished male in no acute distress; appearance consistent with age of record HENT: normocephalic; atraumatic Eyes: pupils equal, round and reactive to light; extraocular muscles intact; arcus senilis bilaterally Neck: supple Heart: regular  rate and rhythm Lungs: clear to auscultation bilaterally Abdomen: soft; mildly distended; nontender; bowel sounds present Extremities: No deformity; full range of motion; edema of the lower legs, right greater than left Neurologic: Awake, alert and oriented; motor function intact in all extremities and symmetric; no facial droop Skin: Warm and dry Psychiatric: Normal mood and affect   RESULTS  Summary of this visit's results, reviewed and interpreted by myself:   EKG Interpretation  Date/Time:    Ventricular Rate:    PR Interval:    QRS Duration:   QT Interval:    QTC Calculation:   R Axis:     Text Interpretation:         Laboratory Studies: Results for orders placed or performed during the hospital encounter of 05/05/21 (from the past 24 hour(s))  Basic metabolic panel     Status: Abnormal   Collection Time: 05/05/21 11:00 PM  Result Value Ref Range   Sodium 138 135 - 145 mmol/L   Potassium 3.4 (L) 3.5 - 5.1 mmol/L   Chloride 100 98 - 111 mmol/L   CO2 25 22 - 32 mmol/L   Glucose, Bld 127 (H) 70 - 99 mg/dL   BUN 27 (H) 8 - 23 mg/dL   Creatinine, Ser 05/07/21 0.61 - 1.24 mg/dL   Calcium 9.7 8.9 - 9.48 mg/dL   GFR, Estimated 01.6 >55 mL/min   Anion gap 13 5 - 15  CBC with Differential/Platelet     Status: Abnormal   Collection Time: 05/05/21 11:00 PM  Result Value Ref Range   WBC 10.6 (H) 4.0 - 10.5 K/uL   RBC 4.33 4.22 - 5.81 MIL/uL   Hemoglobin 11.6 (L) 13.0 - 17.0 g/dL   HCT 05/07/21 (L) 48.2 - 70.7 %   MCV 85.0 80.0 - 100.0 fL   MCH 26.8 26.0 - 34.0 pg   MCHC 31.5 30.0 - 36.0 g/dL   RDW 86.7 (H) 54.4 - 92.0 %   Platelets 170 150 - 400 K/uL   nRBC 0.0 0.0 - 0.2 %   Neutrophils Relative % 87 %   Neutro Abs 9.3 (H) 1.7 - 7.7 K/uL   Lymphocytes Relative 7 %   Lymphs Abs 0.8 0.7 - 4.0 K/uL   Monocytes Relative 5 %   Monocytes Absolute 0.5 0.1 - 1.0 K/uL   Eosinophils Relative 0 %   Eosinophils Absolute 0.0 0.0 - 0.5 K/uL   Basophils Relative 0 %   Basophils  Absolute 0.0 0.0 - 0.1 K/uL   Immature Granulocytes 1 %   Abs Immature Granulocytes 0.05 0.00 - 0.07 K/uL   Imaging Studies: CT ABDOMEN PELVIS WO CONTRAST  Result Date: 05/06/2021 CLINICAL DATA:  Bowel obstruction EXAM:  CT ABDOMEN AND PELVIS WITHOUT CONTRAST TECHNIQUE: Multidetector CT imaging of the abdomen and pelvis was performed following the standard protocol without IV contrast. COMPARISON:  None. FINDINGS: Lower chest: The visualized lung bases are clear. Small hiatal hernia present. Fluid is seen within a distended distal esophagus likely reflecting changes of gastroesophageal reflux. Cardiac size within normal limits. No pericardial effusion. Hepatobiliary: No focal liver abnormality is seen. No gallstones, gallbladder wall thickening, or biliary dilatation. Pancreas: Unremarkable Spleen: Unremarkable Adrenals/Urinary Tract: Adrenal glands are unremarkable. Kidneys are normal, without renal calculi, focal lesion, or hydronephrosis. Bladder is unremarkable. Stomach/Bowel: There is sigmoid volvulus identified with distension of the involved colon up to 9 cm in greatest dimension. The mesenteric twist is best visualized within the mid abdomen axial image # 54/2. There is moderate mesenteric edema involving the affected redundant sigmoid colon. There is trace ascites present. No free intraperitoneal gas. No pneumatosis identified. The stomach and small bowel are unremarkable. Moderate stool seen within the more proximal colon. The appendix is unremarkable. Vascular/Lymphatic: Mild aortoiliac atherosclerotic calcification. No aortic aneurysm. No pathologic adenopathy within the abdomen and pelvis. Reproductive: Prostate is unremarkable. Small right varicocele noted. The right testicle appears located within the right inguinal canal. Other: Tiny fat containing umbilical hernia Musculoskeletal: Degenerative changes are seen within the lumbar spine. No lytic or blastic bone lesion identified. IMPRESSION:  Sigmoid volvulus. Moderate distension of the affected sigmoid colon, moderate mesenteric edema involving the sigmoid mesentery, and mild ascites present. No free intraperitoneal gas or pneumatosis to suggest bowel infarction or perforation. Small hiatal hernia. Fluid-filled distal esophagus in keeping with probable gastroesophageal reflux. Aortic Atherosclerosis (ICD10-I70.0). Electronically Signed   By: Helyn Numbers MD   On: 05/06/2021 00:17   DG Abd 1 View  Result Date: 05/05/2021 CLINICAL DATA:  Umbilical pain EXAM: ABDOMEN - 1 VIEW COMPARISON:  None. FINDINGS: Large amount of dilated bowel, predominantly in the right hemiabdomen. Large amount of ascending colonic stool. The cecum appears to be twisted and projecting toward the left upper quadrant. 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 CT abdomen and pelvis recommended for confirmation. I attempted to call critical Value/emergent results by telephone at the time of interpretation on 05/05/2021 at 9:16 pm to provider [facility], but the referring urgent care had closed and there was no on-call provider. According to their notes, they had referred the patient to the ER at [facility] for a CT scan. He had not arrived there at the time of this report. I also attempted to contact the patient on his home and mobile phones, but was not able to reach him. Electronically Signed   By: Deatra Robinson M.D.   On: 05/05/2021 21:18    ED COURSE and MDM  Nursing notes, initial and subsequent vitals signs, including pulse oximetry, reviewed and interpreted by myself.  Vitals:   05/05/21 2239 05/05/21 2240  BP: (!) 164/89   Pulse: (!) 105   Resp: 18   Temp: 98.3 F (36.8 C)   TempSrc: Oral   SpO2: 100%   Weight:  99.8 kg  Height:  6\' 2"  (1.88 m)   Medications  ondansetron (ZOFRAN) injection 4 mg (4 mg Intravenous Given 05/06/21 0013)  fentaNYL (SUBLIMAZE) injection 25 mcg (25 mcg Intravenous Given 05/06/21 0012)   12:53 AM Discussed with  patient's gastroenterologist, Dr. 05/08/21.  He will see the patient in the [facility] long ED and prepare to take him to the endoscopy suite.  Mia McDonald, PA-C accepts transfer on behalf of Dr. [facility]  PROCEDURES  Procedures   ED DIAGNOSES     ICD-10-CM   1. 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

## 2021-05-05 NOTE — ED Notes (Signed)
Pt had xray abd at [facility] and showed Suspected cecal volvulus. Informed EDP and ordered  CT abd

## 2021-05-05 NOTE — ED Notes (Signed)
Pt had

## 2021-05-05 NOTE — Discharge Instructions (Addendum)
Your urine shows no infection Your xray shows you have some large amount of stool in part of your colon, but since you could not stand up I cant tell if there is something else more serious going on. Because your abdomen hurts when I let go you need to have a cat scan of your abdomen and we cant do this here. You need to go to the ER to have it done and some blood work.   Try this ER which is not as busy as the one next door right now.  [address]  [place], [place] [address]

## 2021-05-05 NOTE — ED Triage Notes (Signed)
Pt reports abd pain and vomiting that started Friday   LBM  today

## 2021-05-05 NOTE — ED Provider Notes (Signed)
[facility]    CSN: [id number] Arrival date & time: 05/05/21  1758      History   Chief Complaint Chief Complaint  Patient presents with   Abdominal Pain    HPI Brandon [name] is a 75 y.o. male presents with his wife due to having periumbilical pain x 2 days. He vomited ones yesterday and again this am after eating. Has been feeling bitter matter coming up his throat. Has hx of enlarged prostate and denies dysuria, or feeling he cant empty his bladder. Has hesitation and slow stream. Saw his urologist last 2/'22. His appetite is decreased. Has not had URI, fatigue or fever. Has not tested himself for covid. Has been having mild diarrhea since yesterday.     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 Authorizing Provider  aspirin EC 81 MG tablet Take 81 mg by mouth daily.    [provider]  atorvastatin (LIPITOR) 20 MG tablet Take 20 mg by mouth daily.    [provider]  buPROPion (WELLBUTRIN SR) 150 MG 12 hr tablet Take 150 mg by mouth daily. 08/29/19   [provider]  finasteride (PROSCAR) 5 MG tablet Take 5 mg by mouth daily.     [provider]  fluticasone (FLONASE) 50 MCG/ACT nasal spray Place 1-2 sprays into both nostrils daily.    [provider]  furosemide (LASIX) 40 MG tablet Take 40 mg by mouth daily. 02/12/21   [provider]  losartan (COZAAR) 50 MG tablet Take 50 mg by mouth daily. 11/29/19   [provider]  omeprazole (PRILOSEC) 20 MG capsule Take 20 mg by mouth daily. 10/03/19   [provider]  oxybutynin (DITROPAN-XL) 5 MG 24 hr tablet Take 5 mg by mouth daily. 10/28/19   [provider]  rosuvastatin (CRESTOR) 5 MG tablet Take 5 mg by mouth at bedtime. 02/12/21   [provider]  tamsulosin (FLOMAX) 0.4 MG CAPS capsule Take 0.4 mg by mouth daily.    [provider]  Travoprost, BAK Free, (TRAVATAN) 0.004 % SOLN ophthalmic solution INSTILL 1 DROP INTO BOTH EYES EVERY DAY AT NIGHT 03/09/15   [provider]  trifluoperazine (STELAZINE) 5 MG tablet Take 5 mg by mouth at bedtime.    [provider]  Vitamin D, Ergocalciferol, (DRISDOL) 1.25 MG (50000 UNIT) CAPS capsule Take 1 capsule by mouth once a week. 02/12/21   [provider]    Family History Family History  Problem Relation Age of Onset   Kidney disease Mother  Breast cancer Mother    Breast cancer Father    Leukemia Father     Social History Social History   Tobacco Use   Smoking status: Never   Smokeless tobacco: Never  Substance Use Topics   Alcohol use: No   Drug use: No     Allergies   No known allergies   Review of Systems Review of Systems  Constitutional:  Positive for appetite change. Negative for activity change, diaphoresis, fatigue and fever.  HENT:  Negative for congestion.   Respiratory:  Negative for cough, chest tightness and shortness of breath.   Cardiovascular:  Negative for chest pain.  Gastrointestinal:  Positive for abdominal distention and vomiting. Negative for constipation, diarrhea and nausea.   Genitourinary:  Negative for difficulty urinating, dysuria, flank pain, frequency and urgency.  Skin:  Negative for rash.    Physical Exam Triage Vital Signs ED Triage Vitals  Enc Vitals Group     BP 05/05/21 1928 (!) 170/86     Pulse Rate 05/05/21 1928 (!) 118     Resp 05/05/21 1928 20     Temp 05/05/21 1928 98 F (36.7 C)     Temp Source 05/05/21 1928 Oral     SpO2 05/05/21 1928 100 %     Weight --      Height --      Head Circumference --      Peak Flow --      Pain Score 05/05/21 1929 7     Pain Loc --      Pain Edu? --      Excl. in GC? --    No data found.  Updated Vital Signs BP (!) 165/91 (BP Location: Right Arm)   Pulse (!) 107   Temp 98 F (36.7 C) (Oral)   Resp 20   SpO2 100%   Visual Acuity Right Eye Distance:   Left Eye Distance:   Bilateral Distance:    Right Eye Near:   Left Eye Near:    Bilateral Near:     Physical Exam Vitals and nursing note reviewed.  Constitutional:      General: He is not in acute distress.    Appearance: He is not toxic-appearing.  HENT:     Head: Normocephalic.  Eyes:     General: No scleral icterus.    Extraocular Movements: Extraocular movements intact.  Cardiovascular:     Rate and Rhythm: Regular rhythm.  Pulmonary:     Effort: No respiratory distress.     Breath sounds: Normal breath sounds.  Abdominal:     Palpations: Abdomen is soft.     Tenderness: There is abdominal tenderness in the periumbilical area. There is rebound. There is no right CVA tenderness, left CVA tenderness or guarding.     Comments: He cant get up on the exam table to access   organomegaly well.   Skin:    General: Skin is warm and dry.  Neurological:     Mental Status: He is alert and oriented to person, place, and time.  Psychiatric:        Mood and Affect: Mood normal.        Behavior: Behavior normal.     UC Treatments / Results  Labs (all labs ordered are listed, but only abnormal results are displayed) Labs Reviewed   POCT URINALYSIS DIPSTICK, ED / UC - Abnormal; Notable for the following components:      Result Value   Ketones, ur 15 (*)  Protein, ur 30 (*)    All other components within normal limits    EKG   Radiology No results found.  Procedures Procedures (including critical care time)  Medications Ordered in UC Medications - No data to display  Initial Impression / Assessment and Plan / UC Course  I have reviewed the triage vital signs and the nursing notes. Pertinent labs & imaging results that were available during my care of the patient were reviewed by me and considered in my medical decision making (see chart for details). Pt has rebound on abdominal exam and needs to go to ER for further work up.     Final Clinical Impressions(s) / UC Diagnoses   Final diagnoses:  Abdominal pain, unspecified abdominal location  Non-intractable vomiting with nausea, unspecified vomiting type     Discharge Instructions      Your urine shows no infection Your xray shows you have some large amount of stool in part of your colon, but since you could not stand up I cant tell if there is something else more serious going on. Because your abdomen hurts when I let go you need to have a cat scan of your abdomen and we cant do this here. You need to go to the ER to have it done and some blood work.   Try this ER which is not as busy as the one next door right now.  [address]  [place], [place] [address]     ED Prescriptions   None    PDMP not reviewed this encounter.   Garey Ham, [place] 05/05/21 2105

## 2021-05-06 ENCOUNTER — Emergency Department (HOSPITAL_COMMUNITY): Payer: Medicare Other | Admitting: Anesthesiology

## 2021-05-06 ENCOUNTER — Encounter (HOSPITAL_COMMUNITY): Payer: Self-pay | Admitting: Internal Medicine

## 2021-05-06 ENCOUNTER — Encounter (HOSPITAL_COMMUNITY)
Admission: EM | Disposition: A | Discharge status: Skilled Nursing Facility | Payer: Self-pay | Source: Home / Self Care | Attending: Family Medicine

## 2021-05-06 DIAGNOSIS — R109 Unspecified abdominal pain: Secondary | ICD-10-CM | POA: Diagnosis present

## 2021-05-06 DIAGNOSIS — R188 Other ascites: Secondary | ICD-10-CM | POA: Diagnosis not present

## 2021-05-06 DIAGNOSIS — I878 Other specified disorders of veins: Secondary | ICD-10-CM | POA: Diagnosis not present

## 2021-05-06 DIAGNOSIS — I10 Essential (primary) hypertension: Secondary | ICD-10-CM | POA: Diagnosis present

## 2021-05-06 DIAGNOSIS — N182 Chronic kidney disease, stage 2 (mild): Secondary | ICD-10-CM | POA: Diagnosis not present

## 2021-05-06 DIAGNOSIS — K562 Volvulus: Secondary | ICD-10-CM | POA: Diagnosis not present

## 2021-05-06 DIAGNOSIS — I872 Venous insufficiency (chronic) (peripheral): Secondary | ICD-10-CM | POA: Diagnosis not present

## 2021-05-06 DIAGNOSIS — Z7982 Long term (current) use of aspirin: Secondary | ICD-10-CM | POA: Diagnosis not present

## 2021-05-06 DIAGNOSIS — H9190 Unspecified hearing loss, unspecified ear: Secondary | ICD-10-CM | POA: Diagnosis not present

## 2021-05-06 DIAGNOSIS — F209 Schizophrenia, unspecified: Secondary | ICD-10-CM

## 2021-05-06 DIAGNOSIS — Z20822 Contact with and (suspected) exposure to covid-19: Secondary | ICD-10-CM | POA: Diagnosis not present

## 2021-05-06 DIAGNOSIS — H409 Unspecified glaucoma: Secondary | ICD-10-CM | POA: Diagnosis not present

## 2021-05-06 DIAGNOSIS — N400 Enlarged prostate without lower urinary tract symptoms: Secondary | ICD-10-CM | POA: Diagnosis not present

## 2021-05-06 DIAGNOSIS — I129 Hypertensive chronic kidney disease with stage 1 through stage 4 chronic kidney disease, or unspecified chronic kidney disease: Secondary | ICD-10-CM | POA: Diagnosis not present

## 2021-05-06 DIAGNOSIS — Z79899 Other long term (current) drug therapy: Secondary | ICD-10-CM | POA: Diagnosis not present

## 2021-05-06 DIAGNOSIS — E785 Hyperlipidemia, unspecified: Secondary | ICD-10-CM | POA: Diagnosis not present

## 2021-05-06 DIAGNOSIS — K649 Unspecified hemorrhoids: Secondary | ICD-10-CM | POA: Diagnosis not present

## 2021-05-06 DIAGNOSIS — D62 Acute posthemorrhagic anemia: Secondary | ICD-10-CM | POA: Diagnosis not present

## 2021-05-06 HISTORY — PX: COLONOSCOPY WITH PROPOFOL: SHX5780

## 2021-05-06 HISTORY — PX: BOWEL DECOMPRESSION: SHX5532

## 2021-05-06 LAB — RESP PANEL BY RT-PCR (FLU A&B, COVID) ARPGX2
Influenza A by PCR: NEGATIVE
Influenza B by PCR: NEGATIVE
SARS Coronavirus 2 by RT PCR: NEGATIVE

## 2021-05-06 SURGERY — COLONOSCOPY WITH PROPOFOL
Anesthesia: General

## 2021-05-06 SURGERY — COLONOSCOPY WITH PROPOFOL
Anesthesia: Monitor Anesthesia Care

## 2021-05-06 MED ORDER — PEG-KCL-NACL-NASULF-NA ASC-C 100 G PO SOLR
0.5000 | ORAL | Status: AC
  Administered 2021-05-06 – 2021-05-07 (×2): 100 g via ORAL
  Filled 2021-05-06: qty 1

## 2021-05-06 MED ORDER — ONDANSETRON HCL 4 MG/2ML IJ SOLN
INTRAMUSCULAR | Status: DC | PRN
  Administered 2021-05-06: 4 mg via INTRAVENOUS

## 2021-05-06 MED ORDER — ONDANSETRON HCL 4 MG PO TABS: 4.0000 mg | ORAL_TABLET | Freq: Four times a day (QID) | ORAL | Status: DC | PRN

## 2021-05-06 MED ORDER — PROPOFOL 10 MG/ML IV BOLUS
INTRAVENOUS | Status: AC
  Filled 2021-05-06: qty 20

## 2021-05-06 MED ORDER — DEXAMETHASONE SODIUM PHOSPHATE 10 MG/ML IJ SOLN
INTRAMUSCULAR | Status: DC | PRN
  Administered 2021-05-06: 8 mg via INTRAVENOUS

## 2021-05-06 MED ORDER — FENTANYL CITRATE (PF) 100 MCG/2ML IJ SOLN
INTRAMUSCULAR | Status: DC | PRN
  Administered 2021-05-06: 50 ug via INTRAVENOUS

## 2021-05-06 MED ORDER — ACETAMINOPHEN 650 MG RE SUPP
650.0000 mg | Freq: Four times a day (QID) | RECTAL | Status: DC | PRN
  Filled 2021-05-06: qty 1

## 2021-05-06 MED ORDER — LACTATED RINGERS IV SOLN: INTRAVENOUS | Status: DC

## 2021-05-06 MED ORDER — METOCLOPRAMIDE HCL 5 MG/ML IJ SOLN: 10.0000 mg | Freq: Once | INTRAMUSCULAR | Status: DC

## 2021-05-06 MED ORDER — ENOXAPARIN SODIUM 40 MG/0.4ML IJ SOSY: 40.0000 mg | PREFILLED_SYRINGE | INTRAMUSCULAR | Status: DC

## 2021-05-06 MED ORDER — ONDANSETRON HCL 4 MG/2ML IJ SOLN: 4.0000 mg | Freq: Four times a day (QID) | INTRAMUSCULAR | Status: DC | PRN

## 2021-05-06 MED ORDER — SUCCINYLCHOLINE CHLORIDE 200 MG/10ML IV SOSY
PREFILLED_SYRINGE | INTRAVENOUS | Status: DC | PRN
  Administered 2021-05-06: 200 mg via INTRAVENOUS

## 2021-05-06 MED ORDER — LIDOCAINE 2% (20 MG/ML) 5 ML SYRINGE
INTRAMUSCULAR | Status: DC | PRN
  Administered 2021-05-06: 20 mg via INTRAVENOUS

## 2021-05-06 MED ORDER — FENTANYL CITRATE (PF) 100 MCG/2ML IJ SOLN
INTRAMUSCULAR | Status: AC
  Filled 2021-05-06: qty 2

## 2021-05-06 MED ORDER — PROPOFOL 10 MG/ML IV BOLUS
INTRAVENOUS | Status: DC | PRN
  Administered 2021-05-06: 200 mg via INTRAVENOUS

## 2021-05-06 MED ORDER — ACETAMINOPHEN 325 MG PO TABS: 650.0000 mg | ORAL_TABLET | Freq: Four times a day (QID) | ORAL | Status: DC | PRN

## 2021-05-06 SURGICAL SUPPLY — 21 items

## 2021-05-06 NOTE — ED Provider Notes (Signed)
75 year old male received as a transfer from Dr. Read Drivers, EDP, for evaluation by Dr. Christella Hartigan with GI.  Per his HPI  "Brandon [name] is a 75 y.o. male with intermittent abdominal pain for the past 3 days.  He had some vomiting 3 days ago but none until today when the pain worsened and he began vomiting.  His pain is been intermittent and it is diffusely located.  He currently rates it as a 5 out of 10 and it is not worse with palpation.  He had a bowel movement today but describes it as thin and loose rather than formed.  He was seen at an urgent care and a plain x-ray of the abdomen which was suspicious for cecal volvulus.  He was sent to this facility for CT of the abdomen and pelvis.  His abdomen has been distended at times but he feels that it is not distended at the present time.  He states swelling in his lower legs is chronic and greater on the right."  Physical Exam  BP (!) 174/92 (BP Location: Left Arm)   Pulse (!) 107   Temp (!) 97.4 F (36.3 C) (Oral)   Resp 18   Ht 6\' 2"  (1.88 m)   Wt 99.8 kg   SpO2 97%   BMI 28.25 kg/m   Physical Exam Vitals and nursing note reviewed.  Constitutional:      Appearance: He is well-developed.     Comments: Actively having hiccups  HENT:     Head: Normocephalic and atraumatic.  Pulmonary:     Effort: Pulmonary effort is normal.  Abdominal:     General: There is distension.     Tenderness: There is abdominal tenderness.  Musculoskeletal:     Cervical back: Neck supple.  Neurological:     Mental Status: He is alert and oriented to person, place, and time.     Cranial Nerves: No cranial nerve deficit.  Psychiatric:        Behavior: Behavior normal.    ED Course/Procedures     Procedures  MDM  75 year old male received as a transfer from general.  She ED by Dr. 65 for evaluation by GI after patient was not found to have a sigmoid volvulus.  No free intraperitoneal gas or pneumatosis to suggest bowel infarction or perforation.   Previous labs and work-up have been reviewed by me.  Dr. Read Drivers is in route to the ED to evaluate the patient.  He reports the pain is well controlled, but he would like something for his hiccups.  Reglan has been ordered.  He will require admission to the hospitalist service for further evaluation.  Consult to the hospitalist service and Dr. Perry Mount will accept the patient for admission. The patient appears reasonably stabilized for admission considering the current resources, flow, and capabilities available in the ED at this time, and I doubt any other [facility] requiring further screening and/or treatment in the ED prior to admission.      [facility], PA-C 05/06/21 0413    05/08/21, MD 05/06/21 [phone]

## 2021-05-06 NOTE — Progress Notes (Signed)
I spoke with the patient's sister, Melinda Searcy, and updated her on our plan for colonoscopy, hopefully tomorrow afternoon, prior to him undergoing surgery.  She will share this plan with her other sister, Deborah, as I was not able to get in touch with her on the 2 numbers listed. 

## 2021-05-06 NOTE — ED Notes (Addendum)
Called [facility] to advise patient needs to be transported to [facility] Emergency Patient has already had CT--Dr Molpus also requested to speak with hospitalist for [facility]

## 2021-05-06 NOTE — Op Note (Signed)
[facility] Patient Name: Brandon [name] Procedure Date: 05/06/2021 MRN: [id number] Attending MD: Rachael Fee , MD Date of Birth: [birth year] CSN: [id number] Age: 75 Admit Type: Inpatient Procedure:                Colonoscopy Indications:              Abdominal pain, distension, sigmoid volvulus by CT Providers:                Rachael Fee, MD, Vicki Mallet, RN, Lawson Radar, Technician, Waymond Cera, CRNA Referring MD:              Medicines:                General Anesthesia Complications:            No immediate complications. Estimated blood loss:                            None. Estimated Blood Loss:     Estimated blood loss: none. Procedure:                Pre-Anesthesia Assessment:                           - Prior to the procedure, a History and Physical                            was performed, and patient medications and                            allergies were reviewed. The patient's tolerance of                            previous anesthesia was also reviewed. The risks                            and benefits of the procedure and the sedation                            options and risks were discussed with the patient.                            All questions were answered, and informed consent                            was obtained. Prior Anticoagulants: The patient has                            taken no previous anticoagulant or antiplatelet                            agents. ASA Grade Assessment: II - A patient with  mild systemic disease. After reviewing the risks                            and benefits, the patient was deemed in                            satisfactory condition to undergo the procedure.                           After obtaining informed consent, the colonoscope                            was passed under direct vision. Throughout the                            procedure,  the patient's blood pressure, pulse, and                            oxygen saturations were monitored continuously. The                            CF-HQ190L ([id number] Olympus colonoscope was                            introduced through the anus and advanced to the the                            presumed transverse colon. The colonoscopy was                            performed without difficulty. The patient tolerated                            the procedure well. This was done unprepped. The                            rectum was photographed. Scope In: 3:19:04 AM Scope Out: 3:24:45 AM Total Procedure Duration: 0 hours 5 minutes 41 seconds  Findings:      The colon was narrowed and twisted for several centimeters beginning at       the distal sigmoid colon. I was able to gently advance the adult       colonoscope through the twisted section of the volvulus with mild to       moderate resistance. The colon proximal to the volvulus stenosis was       dilated. The mucosa was only slighly erythematous and friable. I was       able to advance the colonscope to the presumed proximal transverse colon       before encountering solid stool. I suctioned the air from the colon       while removing the scope. It was clear that the twisted segment of       sigmoid colon had untwisted.      The exam was otherwise without abnormality. Impression:               - Sigmoid  volvulus, endoscopically                            reduced/decompressed. The mucosa in the dilated                            colon proximal to the volvulus was only slightly                            erythematous and friable, obviously still viable Moderate Sedation:      Not Applicable - Patient had care per Anesthesia. Recommendation:           - [facility] hospitalist to admit for general surgery                            consultation, consider segmental colon resection as                            this is likely to recur.                            - [facility] will follow. Procedure Code(s):        --- Professional ---                           [id number], Colonoscopy, flexible; with decompression                            (for pathologic distention) (eg, volvulus,                            megacolon), including placement of decompression                            tube, when performed Diagnosis Code(s):        --- Professional ---                           W26.3, Volvulus CPT copyright 2019 American Medical Association. All rights reserved. The codes documented in this report are preliminary and upon coder review may  be revised to meet current compliance requirements. Rachael Fee, MD 05/06/2021 3:43:37 AM This report has been signed electronically. Number of Addenda: 0

## 2021-05-06 NOTE — Consult Note (Addendum)
[facility] Surgery Consult Note  IDO WOLLMAN April 02, 1946  [id number].    Requesting MD: Rob Bunting Chief Complaint: Abdominal bloating and emesis Reason for Consult: 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PCP:  Karl Ito, DO    HPI:  Patient is a 75 year old male presenting to the emergency department last evening with abdominal pain vomiting started on Friday.  Did have a bowel movement yesterday.  He said he got sick on Sunday 05/02/21 also but it got better.  He could not really tell me what was different about yesterday Work-up showed he was afebrile but tachycardic and hypertensive.  K+3.4, glucose 127, Creatinine 1.16.  WBC 10.6, H/H 11.6/36.8, platelets 170K.  CT abdomen without contrast:  Small hiatal hernia/GERD, There is sigmoid volvulus identified with distension of the involved colon up to 9 cm in greatest dimension. The mesenteric twist is best visualized within the mid abdomen axial image # 54/2. There is moderate mesenteric edema involving the affected redundant sigmoid colon. There is trace ascites present. No free intraperitoneal gas. No pneumatosis identified.  He was seen by Dr. Rob Bunting, and was taken to Endoscopy and underwent emergent colonoscopy for his sigmoid volvulus.   Colonoscopy:  The colon was narrowed and twisted for several centimeters beginning at the distal sigmoid colon. He was able to gently advance the adult colonoscope through the twisted section of the volvulus with mild to moderate resistance. The colon proximal to the volvulus stenosis was dilated. The mucosa was only slighly erythematous and friable. I was able to advance the colonscope to the presumed proximal transverse colon before encountering solid stool. I suctioned the air from the colon while removing the scope. It was clear that the twisted segment of sigmoid colon had untwisted.  Findings: The exam was otherwise without abnormality. - Sigmoid volvulus, endoscopically reduced/decompressed. The  mucosa in the dilated colon proximal to the volvulus was only slightly erythematous and friable, obviously still viable He is concerned this could happen again and  recommended surgery consult for possible partial colon resection.   ROS: Review of Systems  Constitutional: Negative.   HENT: Negative.    Eyes: Negative.   Respiratory: Negative.    Cardiovascular: Negative.   Gastrointestinal:  Positive for abdominal pain, heartburn (occasional), nausea and vomiting. Negative for blood in stool, constipation, diarrhea and melena.       No colonoscopy history prior to last PM  Genitourinary: Negative.   Musculoskeletal: Negative.   Skin: Negative.   Neurological: Negative.   Endo/Heme/Allergies: Negative.   Psychiatric/Behavioral: Negative.         He has a hx of schizophrenia, and it sounds like he has been on medicines since high school.  He answers questions, but speech is somewhat thick.     Family History  Problem Relation Age of Onset   Kidney disease Mother    Breast cancer Mother    Breast cancer Father    Leukemia Father     Past Medical History:  Diagnosis Date   Allergy    Arthritis    Bilateral knee swelling    Glaucoma    Hypertension    Prostate disorder    Schizophrenia (HCC)    Weakness of lower extremity    left knee, uses walker    Past Surgical History:  Procedure Laterality Date   ANKLE SURGERY  1997   had broken right ankle/had 3 surgeries in 1996/ has pins and rods    Social History:  reports that he has never smoked. He has never  used smokeless tobacco. He reports that he does not drink alcohol and does not use drugs. Tobacco:  none ETOH:  none Drugs:  none  He did yard work and [employer] at one time but lives at home with a sister.  She apparently does all the home care, shopping and cares for him.    Allergies: No Known Allergies  Prior to Admission medications   Medication Sig Start Date End Date Taking? Authorizing Provider   aspirin EC 81 MG tablet Take 81 mg by mouth daily.   Yes [provider]  atorvastatin (LIPITOR) 20 MG tablet Take 20 mg by mouth daily.   Yes [provider]  buPROPion (WELLBUTRIN SR) 150 MG 12 hr tablet Take 150 mg by mouth daily. 08/29/19  Yes [provider]  finasteride (PROSCAR) 5 MG tablet Take 5 mg by mouth daily.   Yes [provider]  fluticasone (FLONASE) 50 MCG/ACT nasal spray Place 1-2 sprays into both nostrils daily as needed for allergies.   Yes [provider]  furosemide (LASIX) 40 MG tablet Take 40 mg by mouth daily. 02/12/21  Yes [provider]  losartan (COZAAR) 50 MG tablet Take 50 mg by mouth daily. 11/29/19  Yes [provider]  omeprazole (PRILOSEC) 20 MG capsule Take 20 mg by mouth daily. 10/03/19  Yes [provider]  oxybutynin (DITROPAN-XL) 5 MG 24 hr tablet Take 5 mg by mouth daily. 10/28/19  Yes [provider]  rosuvastatin (CRESTOR) 5 MG tablet Take 5 mg by mouth at bedtime. 02/12/21  Yes [provider]  tamsulosin (FLOMAX) 0.4 MG CAPS capsule Take 0.4 mg by mouth daily.   Yes [provider]  Travoprost, BAK Free, (TRAVATAN) 0.004 % SOLN ophthalmic solution Place 1 drop into both eyes at bedtime. 03/09/15  Yes [provider]  trifluoperazine (STELAZINE) 5 MG tablet Take 5 mg by mouth at bedtime.   Yes [provider]     Blood pressure (!) 162/92, pulse 86, temperature 98 F (36.7 C), temperature source Oral, resp. rate 16, height  (1.88 m), weight 99.8 kg, SpO2 97 %. Physical Exam:  General: pleasant, WD, WN white male who is laying in bed in NAD HEENT: head is normocephalic, atraumatic.  Sclera are noninjected.  Pupils are equal.  Ears and nose without any masses or lesions.  Mouth is pink and moist Heart: slightly irregular, sinus tachycardia on ECG,  and rhythm.  Normal s1,s2. No obvious murmurs, gallops, or rubs noted.  Palpable radial  and pedal pulses bilaterally Lungs: CTAB, no wheezes, rhonchi, or rales noted.  Respiratory effort nonlabored Abd: soft, NT, ND, +BS, no masses, hernias, or organomegaly.  No pain currently MS: all 4 extremities are symmetrical with no cyanosis, clubbing, or edema.He has compression socks on up to his calves bilaterally. Skin: warm and dry with no masses, lesions, or rashes Neuro: Cranial nerves 2-12 grossly intact, sensation is normal throughout Psych: A&Ox3  His speech is thick and somewhat difficult at times to understand, but he tries to answer questions. He said [facility] with [facility], but on further questioning said he failed the selective service exam.  It sounds like he has been on medicines for "mental health," for some time.  Results for orders placed or performed during the hospital encounter of 05/05/21 (from the past 48 hour(s))  Basic metabolic panel     Status: Abnormal   Collection Time: 05/05/21 11:00 PM  Result Value Ref Range  Sodium 138 135 - 145 mmol/L   Potassium 3.4 (L) 3.5 - 5.1 mmol/L   Chloride 100 98 - 111 mmol/L   CO2 25 22 - 32 mmol/L   Glucose, Bld 127 (H) 70 - 99 mg/dL    Comment: Glucose reference range applies only to samples taken after fasting for at least 8 hours.   BUN 27 (H) 8 - 23 mg/dL   Creatinine, Ser 9.39 0.61 - 1.24 mg/dL   Calcium 9.7 8.9 - 03.0 mg/dL   GFR, Estimated >09 >23 mL/min    Comment: (NOTE) Calculated using the CKD-EPI Creatinine Equation (2021)    Anion gap 13 5 - 15    Comment: Performed at [facility], [address] [place], [place] [address]  CBC with Differential/Platelet     Status: Abnormal   Collection Time: 05/05/21 11:00 PM  Result Value Ref Range   WBC 10.6 (H) 4.0 - 10.5 K/uL   RBC 4.33 4.22 - 5.81 MIL/uL   Hemoglobin 11.6 (L) 13.0 - 17.0 g/dL   HCT 22.6 (L) 33.3 - 54.5 %   MCV 85.0 80.0 - 100.0 fL   MCH 26.8 26.0 - 34.0 pg   MCHC 31.5 30.0 - 36.0 g/dL   RDW 62.5 (H) 63.8 - 93.7 %   Platelets  170 150 - 400 K/uL   nRBC 0.0 0.0 - 0.2 %   Neutrophils Relative % 87 %   Neutro Abs 9.3 (H) 1.7 - 7.7 K/uL   Lymphocytes Relative 7 %   Lymphs Abs 0.8 0.7 - 4.0 K/uL   Monocytes Relative 5 %   Monocytes Absolute 0.5 0.1 - 1.0 K/uL   Eosinophils Relative 0 %   Eosinophils Absolute 0.0 0.0 - 0.5 K/uL   Basophils Relative 0 %   Basophils Absolute 0.0 0.0 - 0.1 K/uL   Immature Granulocytes 1 %   Abs Immature Granulocytes 0.05 0.00 - 0.07 K/uL    Comment: Performed at [facility], [address], [place], [place] [address]  Resp Panel by RT-PCR (Flu A&B, Covid) Nasopharyngeal Swab     Status: None   Collection Time: 05/06/21  1:00 AM   Specimen: Nasopharyngeal Swab; Nasopharyngeal(NP) swabs in vial transport medium  Result Value Ref Range   SARS Coronavirus 2 by RT PCR NEGATIVE NEGATIVE    Comment: (NOTE) SARS-CoV-2 target nucleic acids are NOT DETECTED.  The SARS-CoV-2 RNA is generally detectable in upper respiratory specimens during the acute phase of infection. The lowest concentration of SARS-CoV-2 viral copies this assay can detect is 138 copies/mL. A negative result does not preclude SARS-Cov-2 infection and should not be used as the sole basis for treatment or other patient management decisions. A negative result may occur with  improper specimen collection/handling, submission of specimen other than nasopharyngeal swab, presence of viral mutation(s) within the areas targeted by this assay, and inadequate number of viral copies(<138 copies/mL). A negative result must be combined with clinical observations, patient history, and epidemiological information. The expected result is Negative.  Fact Sheet for Patients:  BloggerCourse.com  Fact Sheet for Healthcare Providers:  SeriousBroker.it  This test is no t yet approved or cleared by the [place] FDA and  has been authorized for detection and/or  diagnosis of SARS-CoV-2 by FDA under an Emergency Use Authorization (EUA). This EUA will remain  in effect (meaning this test can be used) for the duration of the COVID-19 declaration under Section 564(b)(1) of the Act, 21 U.S.C.section 360bbb-3(b)(1), unless the authorization is  terminated  or revoked sooner.       Influenza A by PCR NEGATIVE NEGATIVE   Influenza B by PCR NEGATIVE NEGATIVE    Comment: (NOTE) The Xpert Xpress SARS-CoV-2/FLU/RSV plus assay is intended as an aid in the diagnosis of influenza from Nasopharyngeal swab specimens and should not be used as a sole basis for treatment. Nasal washings and aspirates are unacceptable for Xpert Xpress SARS-CoV-2/FLU/RSV testing.  Fact Sheet for Patients: BloggerCourse.com  Fact Sheet for Healthcare Providers: SeriousBroker.it  This test is not yet approved or cleared by the [place] FDA and has been authorized for detection and/or diagnosis of SARS-CoV-2 by FDA under an Emergency Use Authorization (EUA). This EUA will remain in effect (meaning this test can be used) for the duration of the COVID-19 declaration under Section 564(b)(1) of the Act, 21 U.S.C. section 360bbb-3(b)(1), unless the authorization is terminated or revoked.  Performed at [facility], [address], [place], [place] [address]    CT ABDOMEN PELVIS WO CONTRAST  Result Date: 05/06/2021 CLINICAL DATA:  Bowel obstruction EXAM: CT ABDOMEN AND PELVIS WITHOUT CONTRAST TECHNIQUE: Multidetector CT imaging of the abdomen and pelvis was performed following the standard protocol without IV contrast. COMPARISON:  None. FINDINGS: Lower chest: The visualized lung bases are clear. Small hiatal hernia present. Fluid is seen within a distended distal esophagus likely reflecting changes of gastroesophageal reflux. Cardiac size within normal limits. No pericardial effusion. Hepatobiliary: No focal  liver abnormality is seen. No gallstones, gallbladder wall thickening, or biliary dilatation. Pancreas: Unremarkable Spleen: Unremarkable Adrenals/Urinary Tract: Adrenal glands are unremarkable. Kidneys are normal, without renal calculi, focal lesion, or hydronephrosis. Bladder is unremarkable. Stomach/Bowel: There is sigmoid volvulus identified with distension of the involved colon up to 9 cm in greatest dimension. The mesenteric twist is best visualized within the mid abdomen axial image # 54/2. There is moderate mesenteric edema involving the affected redundant sigmoid colon. There is trace ascites present. No free intraperitoneal gas. No pneumatosis identified. The stomach and small bowel are unremarkable. Moderate stool seen within the more proximal colon. The appendix is unremarkable. Vascular/Lymphatic: Mild aortoiliac atherosclerotic calcification. No aortic aneurysm. No pathologic adenopathy within the abdomen and pelvis. Reproductive: Prostate is unremarkable. Small right varicocele noted. The right testicle appears located within the right inguinal canal. Other: Tiny fat containing umbilical hernia Musculoskeletal: Degenerative changes are seen within the lumbar spine. No lytic or blastic bone lesion identified. IMPRESSION: Sigmoid volvulus. Moderate distension of the affected sigmoid colon, moderate mesenteric edema involving the sigmoid mesentery, and mild ascites present. No free intraperitoneal gas or pneumatosis to suggest bowel infarction or perforation. Small hiatal hernia. Fluid-filled distal esophagus in keeping with probable gastroesophageal reflux. Aortic Atherosclerosis (ICD10-I70.0). Electronically Signed   By: Helyn Numbers MD   On: 05/06/2021 00:17   DG Abd 1 View  Result Date: 05/05/2021 CLINICAL DATA:  Umbilical pain EXAM: ABDOMEN - 1 VIEW COMPARISON:  None. FINDINGS: Large amount of dilated bowel, predominantly in the right hemiabdomen. Large amount of ascending colonic stool. The  cecum appears to be twisted and projecting toward the left upper quadrant. 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 CT abdomen and pelvis recommended for confirmation. I attempted to call critical Value/emergent results by telephone at the time of interpretation on 05/05/2021 at 9:16 pm to provider [facility], but the referring urgent care had closed and there was no on-call provider. According to their notes, they had referred the patient to the ER at [facility] for a CT scan. He had not  arrived there at the time of this report. I also attempted to contact the patient on his home and mobile phones, but was not able to reach him. Electronically Signed   By: Deatra RobinsonKevin  Herman M.D.   On: 05/05/2021 21:18      Assessment/Plan  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Colonoscopy with decompression 05/06/21, Dr. Rob Buntinganiel Jacobs  FEN:NPO ID: None DVT: None  Hx schizophrenia Hx hypertension Venous insufficiency with bilateral lower extremity edema    [facility]PH Glaucoma Hearing loss   Plan:   Pt underwent partial colonoscopy with successful decompression of his sigmoid volvulus.   He has never had a full colonoscopy as best he can remember. I would recommend full bowel prep and then a complete colonoscopy, along with medical clearance also before taking him to the OR for a partial colectomy.  I have  reviewed this with Dr. Carolynne Edouardoth, and he is in agreement.  We will start the bowel prep.   Antoine PocheShepard,Deborah Sister [phone]  [phone]  Cecilie KicksSearcy,Melinda Sister   [phone]  I have discussed the recommendations of the GI service and Dr. Carolynne Edouardoth.  They take care of his affairs but do not have guardianship for him so he can sign his own permit.     Sherrie GeorgeJENNINGS,Yossi Hinchman, [facility] 05/06/2021, 10:41 AM Please see Amion for pager number during day hours 7:00am-4:30pm

## 2021-05-06 NOTE — Progress Notes (Signed)
[facility] Progress Note  CC:  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Subjective: Feeling well.  Resting comfortably in bed.  Having good conversation and laughing/joking.  Objective:  Vital signs in last 24 hours: Temp:  [97.4 F (36.3 C)-99 F (37.2 C)] 98 F (36.7 C) (07/21 0645) Pulse Rate:  [65-118] 68 (07/21 1045) Resp:  [16-26] 18 (07/21 1045) BP: (130-174)/(59-103) 134/66 (07/21 1045) SpO2:  [96 %-100 %] 96 % (07/21 1045) Weight:  [99.8 kg] 99.8 kg (07/20 2240)   General:  Alert, Well-developed, in NAD Heart:  Regular rate and rhythm; no murmurs Pulm:  CTAB.  No W/R/R. Abdomen:  Soft, non-distended.  BS present.  Non-tender. Extremities:  Without edema. Neurologic:  Alert and oriented x 4;  grossly normal neurologically.  Intake/Output this shift: Total I/O In: -  Out: 800 [Urine:800]  Lab Results: Recent Labs    05/05/21 2300  WBC 10.6*  HGB 11.6*  HCT 36.8*  PLT 170   BMET Recent Labs    05/05/21 2300  NA 138  K 3.4*  CL 100  CO2 25  GLUCOSE 127*  BUN 27*  CREATININE 1.16  CALCIUM 9.7    CT ABDOMEN PELVIS WO CONTRAST  Result Date: 05/06/2021 CLINICAL DATA:  Bowel obstruction EXAM: CT ABDOMEN AND PELVIS WITHOUT CONTRAST TECHNIQUE: Multidetector CT imaging of the abdomen and pelvis was performed following the standard protocol without IV contrast. COMPARISON:  None. FINDINGS: Lower chest: The visualized lung bases are clear. Small hiatal hernia present. Fluid is seen within a distended distal esophagus likely reflecting changes of gastroesophageal reflux. Cardiac size within normal limits. No pericardial effusion. Hepatobiliary: No focal liver abnormality is seen. No gallstones, gallbladder wall thickening, or biliary dilatation. Pancreas: Unremarkable Spleen: Unremarkable Adrenals/Urinary Tract: Adrenal glands are unremarkable. Kidneys are normal, without renal calculi, focal lesion, or hydronephrosis. Bladder is unremarkable. Stomach/Bowel: There is  sigmoid volvulus identified with distension of the involved colon up to 9 cm in greatest dimension. The mesenteric twist is best visualized within the mid abdomen axial image # 54/2. There is moderate mesenteric edema involving the affected redundant sigmoid colon. There is trace ascites present. No free intraperitoneal gas. No pneumatosis identified. The stomach and small bowel are unremarkable. Moderate stool seen within the more proximal colon. The appendix is unremarkable. Vascular/Lymphatic: Mild aortoiliac atherosclerotic calcification. No aortic aneurysm. No pathologic adenopathy within the abdomen and pelvis. Reproductive: Prostate is unremarkable. Small right varicocele noted. The right testicle appears located within the right inguinal canal. Other: Tiny fat containing umbilical hernia Musculoskeletal: Degenerative changes are seen within the lumbar spine. No lytic or blastic bone lesion identified. IMPRESSION: Sigmoid volvulus. Moderate distension of the affected sigmoid colon, moderate mesenteric edema involving the sigmoid mesentery, and mild ascites present. No free intraperitoneal gas or pneumatosis to suggest bowel infarction or perforation. Small hiatal hernia. Fluid-filled distal esophagus in keeping with probable gastroesophageal reflux. Aortic Atherosclerosis (ICD10-I70.0). Electronically Signed   By: Helyn Numbers MD   On: 05/06/2021 00:17   DG Abd 1 View  Result Date: 05/05/2021 CLINICAL DATA:  Umbilical pain EXAM: ABDOMEN - 1 VIEW COMPARISON:  None. FINDINGS: Large amount of dilated bowel, predominantly in the right hemiabdomen. Large amount of ascending colonic stool. The cecum appears to be twisted and projecting toward the left upper quadrant. 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 CT abdomen and pelvis recommended for confirmation. I attempted to call critical Value/emergent results by telephone at the time of interpretation on 05/05/2021 at 9:16 pm to provider [facility],  but the referring urgent care had closed and there was no on-call provider. According to their notes, they had referred the patient to the ER at [facility] for a CT scan. He had not arrived there at the time of this report. I also attempted to contact the patient on his home and mobile phones, but was not able to reach him. Electronically Signed   By: Deatra Robinson M.D.   On: 05/05/2021 21:18    Assessment / Plan: *Sigmoid volvulus: Doing well status post decompression via colonoscopy early this morning.  Surgery has seen the patient and we appreciate their input.  Planning for possible colonoscopy tomorrow per surgery's request prior to segmental colon resection.   LOS: 0 days   Princella Pellegrini. Maebel Marasco  05/06/2021, 3:44 PM

## 2021-05-06 NOTE — Progress Notes (Addendum)
PROGRESS NOTE    Brandon [name]  XVQ:[id number] DOB: 08-Mar-1946 DOA: 05/05/2021 PCP: Karl Ito, DO    Brief Narrative:  Mr. Mende was admitted to the hospital with a working diagnosis of sigmoid volvulus.  75 year old male past medical history for hypertension, bilateral lower extremity venous insufficiency, chronic kidney disease stage II and schizophrenia.  Patient reported intermittent abdominal pain for the last 3 days, associated with vomiting.  Because of worsening symptoms he presented to the ED.  On his initial physical examination blood pressure 164/89, heart rate 105, respiratory rate 18, temperature 98.3, oxygen saturation 98% his lungs are clear to auscultation bilaterally, heart S1-S2, present, rhythmic, abdomen was diffusely tender to palpation, positive distention, no lower extremity edema.  Sodium 138, potassium 3.4, chloride 100, bicarb 25, glucose 127, BUN 27, creatinine 1.16, white count 10.6, hemoglobin 9.6, hematocrit 36.8, platelets 170. SARS COVID-19 negative.  Urinalysis specific gravity >1.030  CT of the abdomen and pelvis with sigmoid volvulus.  Moderate distention of the affected sigmoid colon, moderate mesenteric edema involving sigmoid mesentery and mild ascites.  No free intraperitoneal gas or pneumatosis.  Assessment & Plan:   Principal Problem:   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(HCC) Active Problems:   HTN (hypertension)   Schizophrenia (HCC)   Acute sigmoid volvulus. Patient sp decompression/ reduction of sigmoid volvulus. At this point with no nausea or vomiting. High risk for recurrence, surgery has been consulted with plans for partial colectomy.   Patient is medically stable for surgical procedure.   2. HTN/ dyslipidemia. Patient tolerating po well post volvulus reduction, will discontinue IV fluids. Continue blood pressure monitoring. Resume losartan and continue holding on furosemide for now.  Continue with statin therapy.   3. Schizophrenia.  Resume bupropion, trifluperazine.   4. BPH no signs of urinary retention, continue with tamsulosin and oxybutinin.   Patient continue to be at high risk for recurrent volvulus     DVT prophylaxis: Enoxaparin   Code Status:   full  Family Communication:  No family at the bedside. I called his sister not able to reach I left a message on her home phone.    Consultants:  GI  General surgery   Procedures:  Colonoscopy volvulus decompression      Subjective: Patient is feeling better after the colonic decompression, with no nausea or vomiting, no dyspnea or chest pain  Objective: Vitals:   05/06/21 0645 05/06/21 0700 05/06/21 1030 05/06/21 1045  BP: (!) 162/92  132/73 134/66  Pulse: 80 86 65 68  Resp: 16   18  Temp: 98 F (36.7 C)     TempSrc: Oral     SpO2: 98% 97% 97% 96%  Weight:      Height:        Intake/Output Summary (Last 24 hours) at 05/06/2021 1352 Last data filed at 05/06/2021 0710 Gross per 24 hour  Intake --  Output 800 ml  Net -800 ml   Filed Weights   05/05/21 2240  Weight: 99.8 kg    Examination:   General: Not in pain or dyspnea, deconditioned  Neurology: Awake and alert, non focal  E ENT: mild pallor, no icterus, oral mucosa moist Cardiovascular: No JVD. S1-S2 present, rhythmic, no gallops, rubs, or murmurs. No lower extremity edema. Pulmonary: positive breath sounds bilaterally, adequate air movement, no wheezing, rhonchi or rales. Gastrointestinal. Abdomen soft, mild distention but not tender Skin. No rashes Musculoskeletal: no joint deformities     Data Reviewed: I have personally reviewed following labs and imaging studies  CBC: Recent Labs  Lab 05/05/21 2300  WBC 10.6*  NEUTROABS 9.3*  HGB 11.6*  HCT 36.8*  MCV 85.0  PLT 170   Basic Metabolic Panel: Recent Labs  Lab 05/05/21 2300  NA 138  K 3.4*  CL 100  CO2 25  GLUCOSE 127*  BUN 27*  CREATININE 1.16  CALCIUM 9.7   GFR: Estimated Creatinine Clearance: 71.6  mL/min (by C-G formula based on SCr of 1.16 mg/dL). Liver Function Tests: No results for input(s): AST, ALT, ALKPHOS, BILITOT, PROT, ALBUMIN in the last 168 hours. No results for input(s): LIPASE, AMYLASE in the last 168 hours. No results for input(s): AMMONIA in the last 168 hours. Coagulation Profile: No results for input(s): INR, PROTIME in the last 168 hours. Cardiac Enzymes: No results for input(s): CKTOTAL, CKMB, CKMBINDEX, TROPONINI in the last 168 hours. BNP (last 3 results) No results for input(s): PROBNP in the last 8760 hours. HbA1C: No results for input(s): HGBA1C in the last 72 hours. CBG: No results for input(s): GLUCAP in the last 168 hours. Lipid Profile: No results for input(s): CHOL, HDL, LDLCALC, TRIG, CHOLHDL, LDLDIRECT in the last 72 hours. Thyroid Function Tests: No results for input(s): TSH, T4TOTAL, FREET4, T3FREE, THYROIDAB in the last 72 hours. Anemia Panel: No results for input(s): VITAMINB12, FOLATE, FERRITIN, TIBC, IRON, RETICCTPCT in the last 72 hours.    Radiology Studies: I have reviewed all of the imaging during this hospital visit personally     Scheduled Meds: Continuous Infusions:  lactated ringers Stopped (05/06/21 1115)     LOS: 0 days        Trevor Wilkie Annett Gula, MD

## 2021-05-06 NOTE — Anesthesia Procedure Notes (Signed)
Procedure Name: Intubation Date/Time: 05/06/2021 3:13 AM Performed by: Montel Clock, CRNA Pre-anesthesia Checklist: Patient identified, Emergency Drugs available, Suction available, Patient being monitored and Timeout performed Patient Re-evaluated:Patient Re-evaluated prior to induction Oxygen Delivery Method: Circle system utilized Preoxygenation: Pre-oxygenation with 100% oxygen Induction Type: IV induction, Rapid sequence and Cricoid Pressure applied Laryngoscope Size: Mac and 3 Grade View: Grade I Tube type: Oral Tube size: 7.5 mm Number of attempts: 1 Airway Equipment and Method: Stylet Placement Confirmation: ETT inserted through vocal cords under direct vision, positive ETCO2 and breath sounds checked- equal and bilateral Secured at: 23 cm Tube secured with: Tape Dental Injury: Teeth and Oropharynx as per pre-operative assessment

## 2021-05-06 NOTE — H&P (View-Only) (Signed)
I spoke with the patient's sister, Cecilie Kicks, and updated her on our plan for colonoscopy, hopefully tomorrow afternoon, prior to him undergoing surgery.  She will share this plan with her other sister, Gavin Pound, as I was not able to get in touch with her on the 2 numbers listed.

## 2021-05-06 NOTE — H&P (Addendum)
History and Physical    FAREED FUNG VEH:[id number] DOB: Oct 04, 1946 DOA: 05/05/2021  PCP: Karl Ito, DO  Patient coming from: Home  I have personally briefly reviewed patient's old medical records in [facility]  Chief Complaint: Abd pain  HPI: Brandon [name] is a 75 y.o. male with medical history significant of HTN, venous insufficiency of BLE causing edema, CKD 2, schizophrenia.  Pt presents to ED at [facility] with c/o intermittent abd pain for past 3 days.  Had some vomiting 3 days ago but none again until today when the pain worsened and he began vomiting.  Pain is intermittent, diffuse, 5/10 in the ED.  No CP.  Sent in to ED after X ray was worrisome for sigmoid volvulus.  ED Course: CT AP confirms sigmoid volvulus.  Pt transferred to [facility], Dr. Christella Hartigan taking pt to endoscopy as I type this note.   Review of Systems: As per HPI, otherwise all review of systems negative.  Past Medical History:  Diagnosis Date   Allergy    Arthritis    Bilateral knee swelling    Glaucoma    Hypertension    Prostate disorder    Schizophrenia (HCC)    Weakness of lower extremity    left knee, uses walker    Past Surgical History:  Procedure Laterality Date   ANKLE SURGERY  1997   had broken right ankle/had 3 surgeries in 1996/ has pins and rods     reports that he has never smoked. He has never used smokeless tobacco. He reports that he does not drink alcohol and does not use drugs.  Allergies  Allergen Reactions   No Known Allergies     Family History  Problem Relation Age of Onset   Kidney disease Mother    Breast cancer Mother    Breast cancer Father    Leukemia Father      Prior to Admission medications   Medication Sig Start Date End Date Taking? Authorizing Provider  aspirin EC 81 MG tablet Take 81 mg by mouth daily.    [provider]  atorvastatin (LIPITOR) 20 MG tablet Take 20 mg by mouth daily.    [provider]  buPROPion  (WELLBUTRIN SR) 150 MG 12 hr tablet Take 150 mg by mouth daily. 08/29/19   [provider]  finasteride (PROSCAR) 5 MG tablet Take 5 mg by mouth daily.    [provider]  fluticasone (FLONASE) 50 MCG/ACT nasal spray Place 1-2 sprays into both nostrils daily.    [provider]  furosemide (LASIX) 40 MG tablet Take 40 mg by mouth daily. 02/12/21   [provider]  losartan (COZAAR) 50 MG tablet Take 50 mg by mouth daily. 11/29/19   [provider]  omeprazole (PRILOSEC) 20 MG capsule Take 20 mg by mouth daily. 10/03/19   [provider]  oxybutynin (DITROPAN-XL) 5 MG 24 hr tablet Take 5 mg by mouth daily. 10/28/19   [provider]  rosuvastatin (CRESTOR) 5 MG tablet Take 5 mg by mouth at bedtime. 02/12/21   [provider]  tamsulosin (FLOMAX) 0.4 MG CAPS capsule Take 0.4 mg by mouth daily.    [provider]  Travoprost, BAK Free, (TRAVATAN) 0.004 % SOLN ophthalmic solution INSTILL 1 DROP INTO BOTH EYES EVERY DAY AT NIGHT 03/09/15   [provider]  trifluoperazine (STELAZINE) 5 MG tablet Take 5 mg by mouth at bedtime.    [provider]  Vitamin D, Ergocalciferol, (DRISDOL)  1.25 MG (50000 UNIT) CAPS capsule Take 1 capsule by mouth once a week. 02/12/21   [provider]    Physical Exam: Vitals:   05/05/21 2239 05/05/21 2240 05/06/21 0042 05/06/21 0144  BP: (!) 164/89  (!) 163/90 (!) 174/92  Pulse: (!) 105  (!) 101 (!) 107  Resp: 18  16 18   Temp: 98.3 F (36.8 C)   (!) 97.4 F (36.3 C)  TempSrc: Oral   Oral  SpO2: 100%  98% 97%  Weight:  99.8 kg    Height:  6\' 2"  (1.88 m)      Constitutional: NAD, calm, comfortable Eyes: PERRL, lids and conjunctivae normal ENMT: Mucous membranes are moist. Posterior pharynx clear of any exudate or lesions.Normal dentition.  Neck: normal, supple, no masses, no thyromegaly Respiratory: clear to auscultation bilaterally, no wheezing, no crackles.  Normal respiratory effort. No accessory muscle use.  Cardiovascular: Regular rate and rhythm, no murmurs / rubs / gallops. No extremity edema. 2+ pedal pulses. No carotid bruits.  Abdomen: Mildly distended, mild diffuse TTP Musculoskeletal: no clubbing / cyanosis. No joint deformity upper and lower extremities. Good ROM, no contractures. Normal muscle tone.  Skin: no rashes, lesions, ulcers. No induration Neurologic: CN 2-12 grossly intact. Sensation intact, DTR normal. Strength 5/5 in all 4.  Psychiatric: Normal judgment and insight. Alert and oriented x 3. Normal mood.    Labs on Admission: I have personally reviewed following labs and imaging studies  CBC: Recent Labs  Lab 05/05/21 2300  WBC 10.6*  NEUTROABS 9.3*  HGB 11.6*  HCT 36.8*  MCV 85.0  PLT 170   Basic Metabolic Panel: Recent Labs  Lab 05/05/21 2300  NA 138  K 3.4*  CL 100  CO2 25  GLUCOSE 127*  BUN 27*  CREATININE 1.16  CALCIUM 9.7   GFR: Estimated Creatinine Clearance: 71.6 mL/min (by C-G formula based on SCr of 1.16 mg/dL). Liver Function Tests: No results for input(s): AST, ALT, ALKPHOS, BILITOT, PROT, ALBUMIN in the last 168 hours. No results for input(s): LIPASE, AMYLASE in the last 168 hours. No results for input(s): AMMONIA in the last 168 hours. Coagulation Profile: No results for input(s): INR, PROTIME in the last 168 hours. Cardiac Enzymes: No results for input(s): CKTOTAL, CKMB, CKMBINDEX, TROPONINI in the last 168 hours. BNP (last 3 results) No results for input(s): PROBNP in the last 8760 hours. HbA1C: No results for input(s): HGBA1C in the last 72 hours. CBG: No results for input(s): GLUCAP in the last 168 hours. Lipid Profile: No results for input(s): CHOL, HDL, LDLCALC, TRIG, CHOLHDL, LDLDIRECT in the last 72 hours. Thyroid Function Tests: No results for input(s): TSH, T4TOTAL, FREET4, T3FREE, THYROIDAB in the last 72 hours. Anemia Panel: No results for input(s): VITAMINB12, FOLATE,  FERRITIN, TIBC, IRON, RETICCTPCT in the last 72 hours. Urine analysis:    Component Value Date/Time   LABSPEC >=1.030 05/05/2021 2012   PHURINE 5.5 05/05/2021 2012   GLUCOSEU NEGATIVE 05/05/2021 2012   HGBUR NEGATIVE 05/05/2021 2012   BILIRUBINUR NEGATIVE 05/05/2021 2012   KETONESUR 15 (A) 05/05/2021 2012   PROTEINUR 30 (A) 05/05/2021 2012   UROBILINOGEN 0.2 05/05/2021 2012   NITRITE NEGATIVE 05/05/2021 2012   LEUKOCYTESUR NEGATIVE 05/05/2021 2012    Radiological Exams on Admission: CT ABDOMEN PELVIS WO CONTRAST  Result Date: 05/06/2021 CLINICAL DATA:  Bowel obstruction EXAM: CT ABDOMEN AND PELVIS WITHOUT CONTRAST TECHNIQUE: Multidetector CT imaging of the abdomen and pelvis was performed following the standard protocol without IV contrast. COMPARISON:  None. FINDINGS: Lower chest: The visualized lung bases are clear. Small hiatal hernia present. Fluid is seen within a distended distal esophagus likely reflecting changes of gastroesophageal reflux. Cardiac size within normal limits. No pericardial effusion. Hepatobiliary: No focal liver abnormality is seen. No gallstones, gallbladder wall thickening, or biliary dilatation. Pancreas: Unremarkable Spleen: Unremarkable Adrenals/Urinary Tract: Adrenal glands are unremarkable. Kidneys are normal, without renal calculi, focal lesion, or hydronephrosis. Bladder is unremarkable. Stomach/Bowel: There is sigmoid volvulus identified with distension of the involved colon up to 9 cm in greatest dimension. The mesenteric twist is best visualized within the mid abdomen axial image # 54/2. There is moderate mesenteric edema involving the affected redundant sigmoid colon. There is trace ascites present. No free intraperitoneal gas. No pneumatosis identified. The stomach and small bowel are unremarkable. Moderate stool seen within the more proximal colon. The appendix is unremarkable. Vascular/Lymphatic: Mild aortoiliac atherosclerotic calcification. No aortic  aneurysm. No pathologic adenopathy within the abdomen and pelvis. Reproductive: Prostate is unremarkable. Small right varicocele noted. The right testicle appears located within the right inguinal canal. Other: Tiny fat containing umbilical hernia Musculoskeletal: Degenerative changes are seen within the lumbar spine. No lytic or blastic bone lesion identified. IMPRESSION: Sigmoid volvulus. Moderate distension of the affected sigmoid colon, moderate mesenteric edema involving the sigmoid mesentery, and mild ascites present. No free intraperitoneal gas or pneumatosis to suggest bowel infarction or perforation. Small hiatal hernia. Fluid-filled distal esophagus in keeping with probable gastroesophageal reflux. Aortic Atherosclerosis (ICD10-I70.0). Electronically Signed   By: Helyn NumbersAshesh  Parikh MD   On: 05/06/2021 00:17   DG Abd 1 View  Result Date: 05/05/2021 CLINICAL DATA:  Umbilical pain EXAM: ABDOMEN - 1 VIEW COMPARISON:  None. FINDINGS: Large amount of dilated bowel, predominantly in the right hemiabdomen. Large amount of ascending colonic stool. The cecum appears to be twisted and projecting toward the left upper quadrant. 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 CT abdomen and pelvis recommended for confirmation. I attempted to call critical Value/emergent results by telephone at the time of interpretation on 05/05/2021 at 9:16 pm to [facility], but the referring urgent care had closed and there was no on-call provider. According to their notes, they [facility]. He had not arrived there at the time of this report. I also attempted to contact the patient on his home and mobile phones, but was not able to reach him. Electronically Signed   By: Deatra RobinsonKevin  Herman M.D.   On: 05/05/2021 21:18    EKG: Independently reviewed.  Assessment/Plan Principal Problem:   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(HCC) Active Problems:   HTN (hypertension)   Schizophrenia  (HCC)    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- NPO except sips with meds Dr. Christella HartiganJacobs taking pt to endoscopy emergently for decompression. Per Dr. Christella HartiganJacobs: call gen surg for possible same admission segmental colon resection (to prevent reoccurrence) IVF: LR at 125 HTN - Resume all home BP meds when med rec is completed But will hold diuretics for the moment (takes lasix only PRN for leg swelling due to venous stasis) Schizophrenia - Resume home meds when med rec is completed.  DVT prophylaxis: SCDs Code Status: Full Family Communication: No family in room Disposition Plan: Home after bowel obstruction / volvulus resolved Consults called: Dr. Christella HartiganJacobs, Call gen surg in AM Admission status: Admit to inpatient  Severity of Illness: The appropriate patient status for this patient is INPATIENT. Inpatient status is judged to be reasonable and necessary in order to provide the  required intensity of service to ensure the patient's safety. The patient's presenting symptoms, physical exam findings, and initial radiographic and laboratory data in the context of their chronic comorbidities is felt to place them at high risk for further clinical deterioration. Furthermore, it is not anticipated that the patient will be medically stable for discharge from the hospital within 2 midnights of admission. The following factors support the patient status of inpatient.   IP status due to bowel obstruction from sigmoid volvulus requiring endoscopic decompression emergently.   * I certify that at the point of admission it is my clinical judgment that the patient will require inpatient hospital care spanning beyond 2 midnights from the point of admission due to high intensity of service, high risk for further deterioration and high frequency of surveillance required.*   Quincy Boy M. DO [facility]  How to contact the [facility] Attending or Consulting provider 7A - 7P or covering provider during after hours 7P -7A, for this  patient?  Check the care team in [facility] and look for a) attending/consulting [facility] provider listed and b) the [facility] team listed Log into [email or web address]  Amion Physician Scheduling and messaging for groups and whole hospitals  On call and physician scheduling software for group practices, residents, hospitalists and other medical providers for call, clinic, rotation and shift schedules. On[employer] is a hospital-wide system for scheduling doctors and paging doctors on call. EasyPlot is for scientific plotting and data analysis.  [email or web address]  and use [place]'s universal password to access. If you do not have the password, please contact the hospital operator.  Locate the [facility] provider you are looking for under [facility] and page to a number that you can be directly reached. If you still have difficulty reaching the provider, please page the [facility] (Director on Call) for the Hospitalists listed on amion for assistance.  05/06/2021, 2:34 AM

## 2021-05-06 NOTE — Transfer of Care (Signed)
Immediate Anesthesia Transfer of Care Note  Patient: Brandon [name]  Procedure(s) Performed: COLONOSCOPY WITH PROPOFOL BOWEL DECOMPRESSION  Patient Location: Endoscopy Unit  Anesthesia Type:General  Level of Consciousness: drowsy and patient cooperative  Airway & Oxygen Therapy: Patient Spontanous Breathing and Patient connected to face mask oxygen  Post-op Assessment: Report given to RN and Post -op Vital signs reviewed and stable  Post vital signs: Reviewed and stable  Last Vitals:  Vitals Value Taken Time  BP    Temp    Pulse    Resp    SpO2      Last Pain:  Vitals:   05/06/21 0259  TempSrc: Oral  PainSc: 5          Complications: No notable events documented.

## 2021-05-06 NOTE — Consult Note (Signed)
[facility] Referring Provider: Dr. Renaee Munda from ER Primary Care Physician:  [name], DO Primary Gastroenterologist:  Dr. Ardis Hughs   Reason for Consultation: Sigmoid vovlulus   HPI:  Brandon [name] is a 75 y.o. male whom I have never met in person but did see via telemedicine visit in 2020 for chronic loose stools. At the time of that visit he explained he had chronic loose stools.  I ordered some initial testing but he never completed it and I have not heard from him since then.   Colon cancer does not run in his family.   He has never had colon cancer screening.    He had abd pain, distension that started 3-4 days ago and has gradually worsened until he finally presented to an urgent clinic.  At outset he had vomiting. Has not eaten much since this started but he tells me he has been moving his bowels and passing gas.  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  Currently he is in endoscopy admitting.  In mild discomfort.   Past Medical History:  Diagnosis Date   Allergy    Arthritis    Bilateral knee swelling    Glaucoma    Hypertension    Prostate disorder    Schizophrenia ([place])    Weakness of lower extremity    left knee, uses walker    Past Surgical History:  Procedure Laterality Date   [place]   had broken right ankle/had 3 surgeries in 1996/ has pins and rods    Prior to Admission medications   Medication Sig Start Date End Date Taking? Authorizing Provider  aspirin EC 81 MG tablet Take 81 mg by mouth daily.    [provider]  atorvastatin (LIPITOR) 20 MG tablet Take 20 mg by mouth daily.    [provider]  buPROPion (WELLBUTRIN SR) 150 MG 12 hr tablet Take 150 mg by mouth daily. 08/29/19   [provider]  finasteride (PROSCAR) 5 MG tablet Take 5 mg by mouth daily.    [provider]  fluticasone  (FLONASE) 50 MCG/ACT nasal spray Place 1-2 sprays into both nostrils daily.    [provider]  furosemide (LASIX) 40 MG tablet Take 40 mg by mouth daily. 02/12/21   [provider]  losartan (COZAAR) 50 MG tablet Take 50 mg by mouth daily. 11/29/19   [provider]  omeprazole (PRILOSEC) 20 MG capsule Take 20 mg by mouth daily. 10/03/19   [provider]  oxybutynin (DITROPAN-XL) 5 MG 24 hr tablet Take 5 mg by mouth daily. 10/28/19   [provider]  rosuvastatin (CRESTOR) 5 MG tablet Take 5 mg by mouth at bedtime. 02/12/21   [provider]  tamsulosin (FLOMAX) 0.4 MG CAPS capsule Take 0.4 mg by mouth daily.    [provider]  Travoprost, BAK Free, (TRAVATAN) 0.004 % SOLN ophthalmic solution INSTILL 1 DROP INTO BOTH EYES EVERY DAY AT NIGHT 03/09/15   [provider]  trifluoperazine (STELAZINE) 5 MG tablet Take 5 mg by mouth at bedtime.    [provider]  Vitamin D, Ergocalciferol, (DRISDOL) 1.25 MG (50000 UNIT) CAPS capsule Take 1 capsule by mouth once a week. 02/12/21   [provider]    Current Facility-Administered Medications  Medication Dose Route Frequency Provider Last Rate Last Admin   lactated ringers infusion  Intravenous Continuous Jennette Kettle M, DO       metoCLOPramide (REGLAN) injection 10 mg  10 mg Intravenous Once McDonald, Mia A, PA-C       Current Outpatient Medications  Medication Sig Dispense Refill   aspirin EC 81 MG tablet Take 81 mg by mouth daily.     atorvastatin (LIPITOR) 20 MG tablet Take 20 mg by mouth daily.     buPROPion (WELLBUTRIN SR) 150 MG 12 hr tablet Take 150 mg by mouth daily.     finasteride (PROSCAR) 5 MG tablet Take 5 mg by mouth daily.     fluticasone (FLONASE) 50 MCG/ACT nasal spray Place 1-2 sprays into both nostrils daily.     furosemide (LASIX) 40 MG tablet Take 40 mg by mouth daily.     losartan (COZAAR) 50 MG tablet Take 50 mg by mouth daily.      omeprazole (PRILOSEC) 20 MG capsule Take 20 mg by mouth daily.     oxybutynin (DITROPAN-XL) 5 MG 24 hr tablet Take 5 mg by mouth daily.     rosuvastatin (CRESTOR) 5 MG tablet Take 5 mg by mouth at bedtime.     tamsulosin (FLOMAX) 0.4 MG CAPS capsule Take 0.4 mg by mouth daily.     Travoprost, BAK Free, (TRAVATAN) 0.004 % SOLN ophthalmic solution INSTILL 1 DROP INTO BOTH EYES EVERY DAY AT NIGHT     trifluoperazine (STELAZINE) 5 MG tablet Take 5 mg by mouth at bedtime.     Vitamin D, Ergocalciferol, (DRISDOL) 1.25 MG (50000 UNIT) CAPS capsule Take 1 capsule by mouth once a week.      Allergies as of 05/05/2021 - Review Complete 05/05/2021  Allergen Reaction Noted   No known allergies  10/09/2019    Family History  Problem Relation Age of Onset   Kidney disease Mother    Breast cancer Mother    Breast cancer Father    Leukemia Father     Social History   Socioeconomic History   Marital status: Single    Spouse name: Not on file   Number of children: Not on file   Years of education: Not on file   Highest education level: Not on file  Occupational History   Not on file  Tobacco Use   Smoking status: Never   Smokeless tobacco: Never  Substance and Sexual Activity   Alcohol use: No   Drug use: No   Sexual activity: Not on file  Other Topics Concern   Not on file  Social History Narrative   Not on file   Social Determinants of Health   Financial Resource Strain: Not on file  Food Insecurity: Not on file  Transportation Needs: Not on file  Physical Activity: Not on file  Stress: Not on file  Social Connections: Not on file  Intimate Partner Violence: Not on file    Review of Systems: Pertinent positive and negative review of systems were noted in the above HPI section. Complete review of systems was performed and was otherwise normal.   Physical Exam: Vital signs in last 24 hours: Temp:  [97.4 F (36.3 C)-98.3 F (36.8 C)] 97.4 F (36.3 C) (07/21 0144) Pulse  Rate:  [101-107] 107 (07/21 0144) Resp:  [16-18] 18 (07/21 0144) BP: (163-174)/(89-92) 174/92 (07/21 0144) SpO2:  [97 %-100 %] 97 % (07/21 0144) Weight:  [99.8 kg] 99.8 kg (07/20 2240)   Constitutional: generally well-appearing Psychiatric: alert and oriented x3 Eyes: extraocular movements intact Mouth: oral pharynx moist, no lesions  Neck: supple no lymphadenopathy Cardiovascular: heart regular rate and rhythm Lungs: clear to auscultation bilaterally Abdomen: tense and distended, BS minimal, mildly tender throughout, no obvious ascites, no peritoneal signs Extremities: no lower extremity edema bilaterally Skin: no lesions on visible extremities   Lab Results: Recent Labs    05/05/21 2300  WBC 10.6*  HGB 11.6*  HCT 36.8*  PLT 170  MCV 85.0   BMET Recent Labs    05/05/21 2300  NA 138  K 3.4*  CL 100  CO2 25  GLUCOSE 127*  BUN 27*  CREATININE 1.16  CALCIUM 9.7    Imaging/Other Results: CT ABDOMEN PELVIS WO CONTRAST  Result Date: 05/06/2021 CLINICAL DATA:  Bowel obstruction EXAM: CT ABDOMEN AND PELVIS WITHOUT CONTRAST TECHNIQUE: Multidetector CT imaging of the abdomen and pelvis was performed following the standard protocol without IV contrast. COMPARISON:  None. FINDINGS: Lower chest: The visualized lung bases are clear. Small hiatal hernia present. Fluid is seen within a distended distal esophagus likely reflecting changes of gastroesophageal reflux. Cardiac size within normal limits. No pericardial effusion. Hepatobiliary: No focal liver abnormality is seen. No gallstones, gallbladder wall thickening, or biliary dilatation. Pancreas: Unremarkable Spleen: Unremarkable Adrenals/Urinary Tract: Adrenal glands are unremarkable. Kidneys are normal, without renal calculi, focal lesion, or hydronephrosis. Bladder is unremarkable. Stomach/Bowel: There is sigmoid volvulus identified with distension of the involved colon up to 9 cm in greatest dimension. The mesenteric twist is best  visualized within the mid abdomen axial image # 54/2. There is moderate mesenteric edema involving the affected redundant sigmoid colon. There is trace ascites present. No free intraperitoneal gas. No pneumatosis identified. The stomach and small bowel are unremarkable. Moderate stool seen within the more proximal colon. The appendix is unremarkable. Vascular/Lymphatic: Mild aortoiliac atherosclerotic calcification. No aortic aneurysm. No pathologic adenopathy within the abdomen and pelvis. Reproductive: Prostate is unremarkable. Small right varicocele noted. The right testicle appears located within the right inguinal canal. Other: Tiny fat containing umbilical hernia Musculoskeletal: Degenerative changes are seen within the lumbar spine. No lytic or blastic bone lesion identified. IMPRESSION: Sigmoid volvulus. Moderate distension of the affected sigmoid colon, moderate mesenteric edema involving the sigmoid mesentery, and mild ascites present. No free intraperitoneal gas or pneumatosis to suggest bowel infarction or perforation. Small hiatal hernia. Fluid-filled distal esophagus in keeping with probable gastroesophageal reflux. Aortic Atherosclerosis (ICD10-I70.0). Electronically Signed   By: Fidela Salisbury MD   On: 05/06/2021 00:17   DG Abd 1 View  Result Date: 05/05/2021 CLINICAL DATA:  Umbilical pain EXAM: ABDOMEN - 1 VIEW COMPARISON:  None. FINDINGS: Large amount of dilated bowel, predominantly in the right hemiabdomen. Large amount of ascending colonic stool. The cecum appears to be twisted and projecting toward the left upper quadrant. 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 CT abdomen and pelvis recommended for confirmation. I attempted to call critical Value/emergent results by telephone at the time of interpretation on 05/05/2021 at 9:16 pm to provider [facility], but the referring urgent care had closed and there was no on-call provider. According to their notes, they had referred the  patient to the ER at [facility] for a CT scan. He had not arrived there at the time of this report. I also attempted to contact the patient on his home and mobile phones, but was not able to reach him. Electronically Signed   By: Ulyses Jarred M.D.   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  I doubt he has suffered serious bowel compromise yet.  For endoscopic attempt at decompression this morning.  He understands there are risks to this procedure including perforation, bleeding, cardiopulm emergencies and agrees to proceed.  This is very likely to recur and so he will need admission to the hospital and consultation by general surgery to consider segmental resection of his colon.  Milus Banister, MD  05/06/2021, 2:40 AM [facility] Pager [phone]

## 2021-05-06 NOTE — Anesthesia Preprocedure Evaluation (Addendum)
Anesthesia Evaluation  Patient identified by MRN, date of birth, ID band Patient awake    Reviewed: Allergy & Precautions, NPO status , Patient's Chart, lab work & pertinent test results  History of Anesthesia Complications Negative for: history of anesthetic complications  Airway Mallampati: I  TM Distance: >3 FB Neck ROM: Full    Dental  (+) Chipped, Dental Advisory Given, Missing   Pulmonary neg pulmonary ROS,  05/06/2021 SARS coronavirus NEG   breath sounds clear to auscultation       Cardiovascular hypertension, Pt. on medications (-) angina Rhythm:Regular Rate:Normal  '21 Nuclear stress EF: 59%.  There was no ST segment deviation noted during stress.  The study is normal.  This is a low risk study. '21 ECHO: EF 60-65%, normal LVF, Grade 1 DD, no significant valvular disease   Neuro/Psych PSYCHIATRIC DISORDERS Schizophrenia negative neurological ROS     GI/Hepatic Neg liver ROS, GERD  Medicated,Cecal volvulus: nausea and vomiting   Endo/Other  negative endocrine ROS  Renal/GU Renal InsufficiencyRenal disease   Prostate cancer    Musculoskeletal  (+) Arthritis ,   Abdominal   Peds  Hematology negative hematology ROS (+)   Anesthesia Other Findings   Reproductive/Obstetrics                            Anesthesia Physical Anesthesia Plan  ASA: 3 and emergent  Anesthesia Plan: General   Post-op Pain Management:    Induction: Intravenous and Rapid sequence  PONV Risk Score and Plan: 2 and Ondansetron and Dexamethasone  Airway Management Planned: Oral ETT  Additional Equipment: None  Intra-op Plan:   Post-operative Plan: Extubation in OR  Informed Consent: I have reviewed the patients History and Physical, chart, labs and discussed the procedure including the risks, benefits and alternatives for the proposed anesthesia with the patient or authorized representative who  has indicated his/her understanding and acceptance.     Dental advisory given  Plan Discussed with: CRNA and Surgeon  Anesthesia Plan Comments:        Anesthesia Quick Evaluation

## 2021-05-06 NOTE — Anesthesia Postprocedure Evaluation (Signed)
Anesthesia Post Note  Patient: Brandon [name]  Procedure(s) Performed: COLONOSCOPY WITH PROPOFOL BOWEL DECOMPRESSION     Patient location during evaluation: PACU Anesthesia Type: General Level of consciousness: awake and alert, patient cooperative and oriented Pain management: pain level controlled Vital Signs Assessment: post-procedure vital signs reviewed and stable Respiratory status: spontaneous breathing, nonlabored ventilation and respiratory function stable Cardiovascular status: blood pressure returned to baseline and stable Postop Assessment: no apparent nausea or vomiting Anesthetic complications: no   No notable events documented.  Last Vitals:  Vitals:   05/06/21 0345 05/06/21 0350  BP: (!) 130/59 (!) 133/59  Pulse: 85 (!) 104  Resp: (!) 26 (!) 24  Temp: 37.2 C   SpO2: 100% 100%    Last Pain:  Vitals:   05/06/21 0345  TempSrc: Axillary  PainSc: 0-No pain                 [name],E. Cain Fitzhenry

## 2021-05-06 NOTE — ED Notes (Signed)
Family updated as to patient's status.

## 2021-05-06 NOTE — Plan of Care (Signed)
[facility] will assume care on arrival to accepting facility. Until arrival, care as per EDP. However, [facility] available 24/7 for questions and assistance.  

## 2021-05-07 ENCOUNTER — Emergency Department (HOSPITAL_COMMUNITY): Payer: Medicare Other

## 2021-05-07 ENCOUNTER — Emergency Department (HOSPITAL_COMMUNITY): Payer: Medicare Other | Admitting: Anesthesiology

## 2021-05-07 ENCOUNTER — Encounter (HOSPITAL_COMMUNITY)
Admission: EM | Disposition: A | Discharge status: Skilled Nursing Facility | Payer: Self-pay | Source: Home / Self Care | Attending: Family Medicine

## 2021-05-07 ENCOUNTER — Encounter (HOSPITAL_COMMUNITY): Payer: Self-pay | Admitting: Internal Medicine

## 2021-05-07 DIAGNOSIS — H409 Unspecified glaucoma: Secondary | ICD-10-CM | POA: Diagnosis present

## 2021-05-07 DIAGNOSIS — E785 Hyperlipidemia, unspecified: Secondary | ICD-10-CM | POA: Diagnosis present

## 2021-05-07 DIAGNOSIS — D62 Acute posthemorrhagic anemia: Secondary | ICD-10-CM | POA: Diagnosis not present

## 2021-05-07 DIAGNOSIS — F209 Schizophrenia, unspecified: Secondary | ICD-10-CM | POA: Diagnosis present

## 2021-05-07 DIAGNOSIS — I129 Hypertensive chronic kidney disease with stage 1 through stage 4 chronic kidney disease, or unspecified chronic kidney disease: Secondary | ICD-10-CM | POA: Diagnosis present

## 2021-05-07 DIAGNOSIS — Z79899 Other long term (current) drug therapy: Secondary | ICD-10-CM | POA: Diagnosis not present

## 2021-05-07 DIAGNOSIS — K562 Volvulus: Secondary | ICD-10-CM | POA: Diagnosis present

## 2021-05-07 DIAGNOSIS — N400 Enlarged prostate without lower urinary tract symptoms: Secondary | ICD-10-CM | POA: Diagnosis present

## 2021-05-07 DIAGNOSIS — I878 Other specified disorders of veins: Secondary | ICD-10-CM | POA: Diagnosis present

## 2021-05-07 DIAGNOSIS — H9190 Unspecified hearing loss, unspecified ear: Secondary | ICD-10-CM | POA: Diagnosis present

## 2021-05-07 DIAGNOSIS — N182 Chronic kidney disease, stage 2 (mild): Secondary | ICD-10-CM | POA: Diagnosis present

## 2021-05-07 DIAGNOSIS — I872 Venous insufficiency (chronic) (peripheral): Secondary | ICD-10-CM | POA: Diagnosis present

## 2021-05-07 DIAGNOSIS — R188 Other ascites: Secondary | ICD-10-CM | POA: Diagnosis present

## 2021-05-07 DIAGNOSIS — K649 Unspecified hemorrhoids: Secondary | ICD-10-CM | POA: Diagnosis present

## 2021-05-07 DIAGNOSIS — Z20822 Contact with and (suspected) exposure to covid-19: Secondary | ICD-10-CM | POA: Diagnosis present

## 2021-05-07 DIAGNOSIS — K5989 Other specified functional intestinal disorders: Secondary | ICD-10-CM

## 2021-05-07 DIAGNOSIS — R109 Unspecified abdominal pain: Secondary | ICD-10-CM | POA: Diagnosis present

## 2021-05-07 DIAGNOSIS — Z7982 Long term (current) use of aspirin: Secondary | ICD-10-CM | POA: Diagnosis not present

## 2021-05-07 HISTORY — PX: FLEXIBLE SIGMOIDOSCOPY: SHX5431

## 2021-05-07 HISTORY — PX: BOWEL DECOMPRESSION: SHX5532

## 2021-05-07 LAB — COMPREHENSIVE METABOLIC PANEL
ALT: 35 U/L (ref 0–44)
AST: 48 U/L — ABNORMAL HIGH (ref 15–41)
Albumin: 3.5 g/dL (ref 3.5–5.0)
Alkaline Phosphatase: 52 U/L (ref 38–126)
Anion gap: 10 (ref 5–15)
BUN: 28 mg/dL — ABNORMAL HIGH (ref 8–23)
CO2: 29 mmol/L (ref 22–32)
Calcium: 8.9 mg/dL (ref 8.9–10.3)
Chloride: 102 mmol/L (ref 98–111)
Creatinine, Ser: 1.22 mg/dL (ref 0.61–1.24)
GFR, Estimated: >60 mL/min (ref >60)
Glucose, Bld: 92 mg/dL (ref 70–99)
Potassium: 3.3 mmol/L — ABNORMAL LOW (ref 3.5–5.1)
Sodium: 141 mmol/L (ref 135–145)
Total Bilirubin: 0.6 mg/dL (ref 0.3–1.2)
Total Protein: 6.5 g/dL (ref 6.5–8.1)

## 2021-05-07 LAB — PROTIME-INR
INR: 1.1 (ref 0.8–1.2)
Prothrombin Time: 14.3 seconds (ref 11.4–15.2)

## 2021-05-07 LAB — CBC
HCT: 40.7 % (ref 39.0–52.0)
Hemoglobin: 12.9 g/dL — ABNORMAL LOW (ref 13.0–17.0)
MCH: 27.6 pg (ref 26.0–34.0)
MCHC: 31.7 g/dL (ref 30.0–36.0)
MCV: 87.2 fL (ref 80.0–100.0)
Platelets: 184 10*3/uL (ref 150–400)
RBC: 4.67 MIL/uL (ref 4.22–5.81)
RDW: 16.5 % — ABNORMAL HIGH (ref 11.5–15.5)
WBC: 15.1 10*3/uL — ABNORMAL HIGH (ref 4.0–10.5)
nRBC: 0 % (ref 0.0–0.2)

## 2021-05-07 LAB — PHOSPHORUS: Phosphorus: 2.7 mg/dL (ref 2.5–4.6)

## 2021-05-07 LAB — PREALBUMIN: Prealbumin: 16.1 mg/dL — ABNORMAL LOW (ref 18–38)

## 2021-05-07 LAB — MAGNESIUM: Magnesium: 2.1 mg/dL (ref 1.7–2.4)

## 2021-05-07 LAB — APTT: aPTT: 27 seconds (ref 24–36)

## 2021-05-07 SURGERY — SIGMOIDOSCOPY, FLEXIBLE
Anesthesia: General

## 2021-05-07 MED ORDER — BISACODYL 5 MG PO TBEC
10.0000 mg | DELAYED_RELEASE_TABLET | Freq: Once | ORAL | Status: AC
  Administered 2021-05-07: 10 mg via ORAL
  Filled 2021-05-07: qty 2

## 2021-05-07 MED ORDER — LATANOPROST 0.005 % OP SOLN
1.0000 [drp] | Freq: Every day | OPHTHALMIC | Status: DC
  Administered 2021-05-07 – 2021-05-12 (×6): 1 [drp] via OPHTHALMIC
  Filled 2021-05-07: qty 2.5

## 2021-05-07 MED ORDER — TRIFLUOPERAZINE HCL 5 MG PO TABS
5.0000 mg | ORAL_TABLET | Freq: Every day | ORAL | Status: DC
  Administered 2021-05-08 – 2021-05-12 (×5): 5 mg via ORAL
  Filled 2021-05-07 (×7): qty 1

## 2021-05-07 MED ORDER — FINASTERIDE 5 MG PO TABS
5.0000 mg | ORAL_TABLET | Freq: Every day | ORAL | Status: DC
  Administered 2021-05-09 – 2021-05-13 (×5): 5 mg via ORAL
  Filled 2021-05-07 (×5): qty 1

## 2021-05-07 MED ORDER — LOSARTAN POTASSIUM 50 MG PO TABS
50.0000 mg | ORAL_TABLET | Freq: Every day | ORAL | Status: DC
  Administered 2021-05-09 – 2021-05-10 (×2): 50 mg via ORAL
  Filled 2021-05-07 (×2): qty 1

## 2021-05-07 MED ORDER — FENTANYL CITRATE (PF) 100 MCG/2ML IJ SOLN
INTRAMUSCULAR | Status: AC
  Filled 2021-05-07: qty 2

## 2021-05-07 MED ORDER — ATORVASTATIN CALCIUM 10 MG PO TABS: 20.0000 mg | ORAL_TABLET | Freq: Every day | ORAL | Status: DC

## 2021-05-07 MED ORDER — MIDAZOLAM HCL 2 MG/2ML IJ SOLN: 0.5000 mg | Freq: Once | INTRAMUSCULAR | Status: DC | PRN

## 2021-05-07 MED ORDER — LIDOCAINE 2% (20 MG/ML) 5 ML SYRINGE
INTRAMUSCULAR | Status: DC | PRN
  Administered 2021-05-07: 20 mg via INTRAVENOUS

## 2021-05-07 MED ORDER — ONDANSETRON HCL 4 MG/2ML IJ SOLN
INTRAMUSCULAR | Status: DC | PRN
  Administered 2021-05-07: 4 mg via INTRAVENOUS

## 2021-05-07 MED ORDER — PANTOPRAZOLE SODIUM 40 MG PO TBEC
40.0000 mg | DELAYED_RELEASE_TABLET | Freq: Every day | ORAL | Status: DC
  Administered 2021-05-09 – 2021-05-13 (×5): 40 mg via ORAL
  Filled 2021-05-07 (×5): qty 1

## 2021-05-07 MED ORDER — SODIUM CHLORIDE 0.9 % IV SOLN: INTRAVENOUS | Status: DC

## 2021-05-07 MED ORDER — LACTATED RINGERS IV SOLN
INTRAVENOUS | Status: AC | PRN
  Administered 2021-05-07: 1000 mL via INTRAVENOUS

## 2021-05-07 MED ORDER — PHENYLEPHRINE 40 MCG/ML (10ML) SYRINGE FOR IV PUSH (FOR BLOOD PRESSURE SUPPORT)
PREFILLED_SYRINGE | INTRAVENOUS | Status: DC | PRN
  Administered 2021-05-07: 80 ug via INTRAVENOUS
  Administered 2021-05-07: 120 ug via INTRAVENOUS

## 2021-05-07 MED ORDER — MEPERIDINE HCL 50 MG/ML IJ SOLN: 6.2500 mg | INTRAMUSCULAR | Status: DC | PRN

## 2021-05-07 MED ORDER — SUCCINYLCHOLINE CHLORIDE 200 MG/10ML IV SOSY
PREFILLED_SYRINGE | INTRAVENOUS | Status: DC | PRN
  Administered 2021-05-07: 140 mg via INTRAVENOUS

## 2021-05-07 MED ORDER — DEXAMETHASONE SODIUM PHOSPHATE 10 MG/ML IJ SOLN
INTRAMUSCULAR | Status: DC | PRN
  Administered 2021-05-07: 5 mg via INTRAVENOUS

## 2021-05-07 MED ORDER — BUPROPION HCL ER (SR) 150 MG PO TB12
150.0000 mg | ORAL_TABLET | Freq: Every day | ORAL | Status: DC
  Administered 2021-05-09 – 2021-05-13 (×5): 150 mg via ORAL
  Filled 2021-05-07 (×5): qty 1

## 2021-05-07 MED ORDER — PROPOFOL 10 MG/ML IV BOLUS
INTRAVENOUS | Status: DC | PRN
  Administered 2021-05-07: 200 mg via INTRAVENOUS

## 2021-05-07 MED ORDER — TAMSULOSIN HCL 0.4 MG PO CAPS
0.4000 mg | ORAL_CAPSULE | Freq: Every day | ORAL | Status: DC
  Administered 2021-05-09 – 2021-05-13 (×5): 0.4 mg via ORAL
  Filled 2021-05-07 (×5): qty 1

## 2021-05-07 MED ORDER — FLUTICASONE PROPIONATE 50 MCG/ACT NA SUSP: 1.0000 | Freq: Every day | NASAL | Status: DC | PRN

## 2021-05-07 MED ORDER — ROSUVASTATIN CALCIUM 5 MG PO TABS
5.0000 mg | ORAL_TABLET | Freq: Every day | ORAL | Status: DC
  Administered 2021-05-08 – 2021-05-12 (×5): 5 mg via ORAL
  Filled 2021-05-07 (×6): qty 1

## 2021-05-07 MED ORDER — FENTANYL CITRATE (PF) 100 MCG/2ML IJ SOLN: 25.0000 ug | INTRAMUSCULAR | Status: DC | PRN

## 2021-05-07 MED ORDER — OXYBUTYNIN CHLORIDE ER 5 MG PO TB24
5.0000 mg | ORAL_TABLET | Freq: Every day | ORAL | Status: DC
  Administered 2021-05-09 – 2021-05-13 (×5): 5 mg via ORAL
  Filled 2021-05-07 (×5): qty 1

## 2021-05-07 MED ORDER — PROMETHAZINE HCL 25 MG/ML IJ SOLN: 6.2500 mg | INTRAMUSCULAR | Status: DC | PRN

## 2021-05-07 MED ORDER — EPHEDRINE SULFATE-NACL 50-0.9 MG/10ML-% IV SOSY
PREFILLED_SYRINGE | INTRAVENOUS | Status: DC | PRN
  Administered 2021-05-07: 10 mg via INTRAVENOUS

## 2021-05-07 MED ORDER — FENTANYL CITRATE (PF) 250 MCG/5ML IJ SOLN
INTRAMUSCULAR | Status: DC | PRN
  Administered 2021-05-07: 50 ug via INTRAVENOUS

## 2021-05-07 SURGICAL SUPPLY — 22 items

## 2021-05-07 NOTE — Anesthesia Preprocedure Evaluation (Addendum)
Anesthesia Evaluation  Patient identified by MRN, date of birth, ID band Patient awake    Reviewed: Allergy & Precautions, NPO status , Patient's Chart, lab work & pertinent test results  History of Anesthesia Complications Negative for: history of anesthetic complications  Airway Mallampati: II  TM Distance: >3 FB Neck ROM: Full    Dental  (+)    Pulmonary neg pulmonary ROS,    Pulmonary exam normal        Cardiovascular hypertension, Pt. on medications Normal cardiovascular exam     Neuro/Psych Schizophrenia negative neurological ROS     GI/Hepatic Neg liver ROS, GERD  Medicated and Controlled,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Endo/Other  negative endocrine ROS  Renal/GU Renal InsufficiencyRenal disease  negative genitourinary   Musculoskeletal  (+) Arthritis ,   Abdominal   Peds  Hematology Hgb 12.9   Anesthesia Other Findings K 3.3, glaucoma  Reproductive/Obstetrics negative OB ROS                            Anesthesia Physical Anesthesia Plan  ASA: 2  Anesthesia Plan: General   Post-op Pain Management:    Induction: Intravenous  PONV Risk Score and Plan: 2 and Treatment may vary due to age or medical condition, Ondansetron and Dexamethasone  Airway Management Planned: Oral ETT  Additional Equipment: None  Intra-op Plan:   Post-operative Plan: Extubation in OR  Informed Consent: I have reviewed the patients History and Physical, chart, labs and discussed the procedure including the risks, benefits and alternatives for the proposed anesthesia with the patient or authorized representative who has indicated his/her understanding and acceptance.     Dental advisory given and Consent reviewed with POA  Plan Discussed with: CRNA  Anesthesia Plan Comments: (Some concern from floor RN about patient's capacity to consent for procedure so POA (both sisters) were called by telephone who  gave verbal consent for anesthesia. On arrival to preop, patient is awake, alert, oriented x4 and seems to have good understanding of planned surgical procedure. All questions answered. Stephannie Peters, MD)      Anesthesia Quick Evaluation

## 2021-05-07 NOTE — Progress Notes (Addendum)
1 Day Post-Op   Subjective/Chief Complaint: No complaints. Tolerated bowel prep   Objective: Vital signs in last 24 hours: Pulse Rate:  [90] 90 (07/22 0801) Resp:  [20] 20 (07/22 0801) BP: (112)/(75) 112/75 (07/22 0801) SpO2:  [99 %] 99 % (07/22 0801)    Intake/Output from previous day: 07/21 0701 - 07/22 0700 In: -  Out: 800 [Urine:800] Intake/Output this shift: No intake/output data recorded.  General appearance: alert and cooperative Resp: clear to auscultation bilaterally Cardio: regular rate and rhythm GI: soft, nontender  Lab Results:  Recent Labs    05/05/21 2300 05/07/21 0900  WBC 10.6* 15.1*  HGB 11.6* 12.9*  HCT 36.8* 40.7  PLT 170 184   BMET Recent Labs    05/05/21 2300 05/07/21 0900  NA 138 141  K 3.4* 3.3*  CL 100 102  CO2 25 29  GLUCOSE 127* 92  BUN 27* 28*  CREATININE 1.16 1.22  CALCIUM 9.7 8.9   PT/INR Recent Labs    05/07/21 0900  LABPROT 14.3  INR 1.1   ABG No results for input(s): PHART, HCO3 in the last 72 hours.  Invalid input(s): PCO2, PO2  Studies/Results: CT ABDOMEN PELVIS WO CONTRAST  Result Date: 05/06/2021 CLINICAL DATA:  Bowel obstruction EXAM: CT ABDOMEN AND PELVIS WITHOUT CONTRAST TECHNIQUE: Multidetector CT imaging of the abdomen and pelvis was performed following the standard protocol without IV contrast. COMPARISON:  None. FINDINGS: Lower chest: The visualized lung bases are clear. Small hiatal hernia present. Fluid is seen within a distended distal esophagus likely reflecting changes of gastroesophageal reflux. Cardiac size within normal limits. No pericardial effusion. Hepatobiliary: No focal liver abnormality is seen. No gallstones, gallbladder wall thickening, or biliary dilatation. Pancreas: Unremarkable Spleen: Unremarkable Adrenals/Urinary Tract: Adrenal glands are unremarkable. Kidneys are normal, without renal calculi, focal lesion, or hydronephrosis. Bladder is unremarkable. Stomach/Bowel: There is sigmoid  volvulus identified with distension of the involved colon up to 9 cm in greatest dimension. The mesenteric twist is best visualized within the mid abdomen axial image # 54/2. There is moderate mesenteric edema involving the affected redundant sigmoid colon. There is trace ascites present. No free intraperitoneal gas. No pneumatosis identified. The stomach and small bowel are unremarkable. Moderate stool seen within the more proximal colon. The appendix is unremarkable. Vascular/Lymphatic: Mild aortoiliac atherosclerotic calcification. No aortic aneurysm. No pathologic adenopathy within the abdomen and pelvis. Reproductive: Prostate is unremarkable. Small right varicocele noted. The right testicle appears located within the right inguinal canal. Other: Tiny fat containing umbilical hernia Musculoskeletal: Degenerative changes are seen within the lumbar spine. No lytic or blastic bone lesion identified. IMPRESSION: Sigmoid volvulus. Moderate distension of the affected sigmoid colon, moderate mesenteric edema involving the sigmoid mesentery, and mild ascites present. No free intraperitoneal gas or pneumatosis to suggest bowel infarction or perforation. Small hiatal hernia. Fluid-filled distal esophagus in keeping with probable gastroesophageal reflux. Aortic Atherosclerosis (ICD10-I70.0). Electronically Signed   By: Helyn Numbers MD   On: 05/06/2021 00:17   DG Abd 1 View  Result Date: 05/05/2021 CLINICAL DATA:  Umbilical pain EXAM: ABDOMEN - 1 VIEW COMPARISON:  None. FINDINGS: Large amount of dilated bowel, predominantly in the right hemiabdomen. Large amount of ascending colonic stool. The cecum appears to be twisted and projecting toward the left upper quadrant. 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 CT abdomen and pelvis recommended for confirmation. I attempted to call critical Value/emergent results by telephone at the time of interpretation on 05/05/2021 at 9:16 pm to provider [facility], but  the  referring urgent care had closed and there was no on-call provider. According to their notes, they had referred the patient to the ER at [facility] for a CT scan. He had not arrived there at the time of this report. I also attempted to contact the patient on his home and mobile phones, but was not able to reach him. Electronically Signed   By: Deatra Robinson M.D.   On: 05/05/2021 21:18    Anti-infectives: Anti-infectives (From admission, onward)    None       Assessment/Plan: s/p Procedure(s): COLONOSCOPY WITH PROPOFOL (N/A) BOWEL DECOMPRESSION (N/A) 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For colonoscopy today to evaluate rest of colon. If neg then will likely need sigmoid colectomy in next couple days. May be able to avoid colostomy since he was able to do bowel prep Will follow  LOS: 0 days    Chevis Pretty III 05/07/2021

## 2021-05-07 NOTE — Anesthesia Postprocedure Evaluation (Signed)
Anesthesia Post Note  Patient: Brandon [name]  Procedure(s) Performed: FLEXIBLE SIGMOIDOSCOPY BOWEL DECOMPRESSION     Patient location during evaluation: PACU Anesthesia Type: General Level of consciousness: awake and alert, patient cooperative and oriented Pain management: pain level controlled Vital Signs Assessment: post-procedure vital signs reviewed and stable Respiratory status: spontaneous breathing, nonlabored ventilation and respiratory function stable Cardiovascular status: blood pressure returned to baseline and stable Postop Assessment: no apparent nausea or vomiting Anesthetic complications: no   No notable events documented.  Last Vitals:  Vitals:   05/07/21 1600 05/07/21 1610  BP: 117/66 133/71  Pulse:    Resp: 17 16  Temp:    SpO2: 100% 97%    Last Pain:  Vitals:   05/07/21 1610  TempSrc:   PainSc: 0-No pain                 Staphany Ditton,E. Baleria Wyman

## 2021-05-07 NOTE — ED Notes (Signed)
Pt NPO.

## 2021-05-07 NOTE — ED Notes (Signed)
Contacted Carrie in Endo- will be planning colonoscopy around 1530

## 2021-05-07 NOTE — Anesthesia Preprocedure Evaluation (Signed)
Anesthesia Evaluation  Patient identified by MRN, date of birth, ID band Patient awake    Reviewed: Allergy & Precautions, NPO status , Patient's Chart, lab work & pertinent test results  History of Anesthesia Complications Negative for: history of anesthetic complications  Airway Mallampati: I  TM Distance: >3 FB Neck ROM: Full    Dental  (+) Dental Advisory Given, Chipped, Missing   Pulmonary neg pulmonary ROS,  05/06/2021 SARS coronavirus NEG   breath sounds clear to auscultation       Cardiovascular hypertension, Pt. on medications  Rhythm:Regular Rate:Normal     Neuro/Psych Schizophrenia glaucoma    GI/Hepatic Neg liver ROS, GERD  Medicated,N/v/abd pain with volvulus, reduced yesterday   Endo/Other  negative endocrine ROS  Renal/GU Renal InsufficiencyRenal disease     Musculoskeletal  (+) Arthritis ,   Abdominal   Peds  Hematology negative hematology ROS (+)   Anesthesia Other Findings   Reproductive/Obstetrics                             Anesthesia Physical Anesthesia Plan  ASA: 3  Anesthesia Plan: General   Post-op Pain Management:    Induction: Intravenous  PONV Risk Score and Plan: 2 and Ondansetron and Dexamethasone  Airway Management Planned: Oral ETT  Additional Equipment: None  Intra-op Plan:   Post-operative Plan: Extubation in OR  Informed Consent: I have reviewed the patients History and Physical, chart, labs and discussed the procedure including the risks, benefits and alternatives for the proposed anesthesia with the patient or authorized representative who has indicated his/her understanding and acceptance.     Dental advisory given  Plan Discussed with: CRNA and Surgeon  Anesthesia Plan Comments:         Anesthesia Quick Evaluation

## 2021-05-07 NOTE — Anesthesia Procedure Notes (Signed)
Procedure Name: Intubation Date/Time: 05/07/2021 3:26 PM Performed by: Elyn Peers, CRNA Pre-anesthesia Checklist: Patient identified, Emergency Drugs available, Suction available, Patient being monitored and Timeout performed Patient Re-evaluated:Patient Re-evaluated prior to induction Oxygen Delivery Method: Circle system utilized Preoxygenation: Pre-oxygenation with 100% oxygen Induction Type: IV induction, Rapid sequence and Cricoid Pressure applied Laryngoscope Size: Miller and 3 Grade View: Grade I Tube type: Oral Tube size: 7.5 mm Number of attempts: 1 Airway Equipment and Method: Stylet Placement Confirmation: ETT inserted through vocal cords under direct vision, positive ETCO2 and breath sounds checked- equal and bilateral Secured at: 23 cm Tube secured with: Tape Dental Injury: Teeth and Oropharynx as per pre-operative assessment

## 2021-05-07 NOTE — Op Note (Signed)
[facility] Patient Name: Brandon [name] Procedure Date: 05/07/2021 MRN: [id number] Attending MD: Justice Britain , MD Date of Birth: November 22, 1945 CSN: [id number] Age: 75 Admit Type: Inpatient Procedure:                Flexible Sigmoidoscopy Indications:              Volvulus, Follow-up of volvulus, For therapy of                            volvulus, Generalized abdominal pain, Change in                            stool caliber Providers:                Justice Britain, MD, Carlyn Reichert, RN, Edwina                            Hickman, Tyna Jaksch Technician Referring MD:             Autumn Messing III, MD, Milus Banister, MD Medicines:                General Anesthesia Complications:            No immediate complications. Estimated Blood Loss:     Estimated blood loss was minimal. Procedure:                Pre-Anesthesia Assessment:                           - Prior to the procedure, a History and Physical                            was performed, and patient medications and                            allergies were reviewed. The patient's tolerance of                            previous anesthesia was also reviewed. The risks                            and benefits of the procedure and the sedation                            options and risks were discussed with the patient.                            All questions were answered, and informed consent                            was obtained. Prior Anticoagulants: The patient has                            taken no previous anticoagulant or antiplatelet  agents. ASA Grade Assessment: III - A patient with                            severe systemic disease. After reviewing the risks                            and benefits, the patient was deemed in                            satisfactory condition to undergo the procedure.                           After obtaining informed consent, the scope was                             passed under direct vision. The CF-[id number] ([id number])                            Olympus colonoscope was introduced through the anus                            and advanced to the the descending colon. After                            obtaining informed consent, the scope was passed                            under direct vision. The flexible sigmoidoscopy was                            accomplished without difficulty. The patient                            tolerated the procedure. The quality of the bowel                            preparation was poor. Scope In: 3:34:14 PM Scope Out: 3:49:09 PM Total Procedure Duration: 0 hours 14 minutes 55 seconds  Findings:      The digital rectal exam findings include hemorrhoids. Pertinent       negatives include no palpable rectal lesions.      Copious quantities of semi-liquid and semi-solid stool was found in the       entire visualized colon (up to the descending colon), interfering with       visualization. Lavage of the area was performed using copious amounts,       resulting in incomplete clearance with continued poor visualization.      The lumen of the recto-sigmoid colon and sigmoid colon was grossly       dilated.      This is consistent with an downstream volvulus with apparent localized       ischemia was found in the recto-sigmoid colon/sigmoid colon with       evidence of whorl. Decompression of the volvulus was attempted and was       successful via air decompression, with complete  decompression achieved.       Following the maneuver, a colonic decompression tube was placed to       maintain the decompression. This was evidenced upon repeat passage of       the scope into the colon. Impression:               - Hemorrhoids found on digital rectal exam.                           - Stool in the entire examined colon.                           - Dilated in the recto-sigmoid colon and in the                             sigmoid colon with evidence of a Volvulus.                            Successful complete decompression achieved with air                            decompression and subsequent colonic decompression                            tube placement as well. Moderate Sedation:      Not Applicable - Patient had care per Anesthesia. Recommendation:           - The patient will be observed post-procedure,                            until all discharge criteria are met.                           - Return patient to hospital ward for ongoing care.                           - Observe the patient for one hour.                           - Monitor for signs/symptoms of bleeding,                            perforation, and infection. If issues please call                            our number to get further assistance as needed.                           - Observe clinical course.                           - Full colonoscopy not possible at this time.                           - Consideration of need for partial sigmoidectomy  with patient having had recurrence of volvulus                            within 24 hours of recent decompression and signs                            suggestive of potential developing ischemia - as                            per surgical service, currently with decompression                            tube in place.                           - The findings and recommendations were discussed                            with the patient.                           - The findings and recommendations were discussed                            with the referring physician. Procedure Code(s):        --- Professional ---                           [id number], Sigmoidoscopy, flexible; with decompression                            (for pathologic distention) (eg, volvulus,                            megacolon), including placement of decompression                             tube, when performed Diagnosis Code(s):        --- Professional ---                           K64.9, Unspecified hemorrhoids                           K59.39, Other megacolon                           K56.2, Volvulus                           R10.84, Generalized abdominal pain                           R19.5, Other fecal abnormalities CPT copyright 2019 American Medical Association. All rights reserved. The codes documented in this report are preliminary and upon coder review may  be revised to meet current compliance requirements. Justice Britain, MD 05/07/2021 4:14:26 PM Number of  Addenda: 0

## 2021-05-07 NOTE — Interval H&P Note (Signed)
History and Physical Interval Note:  05/07/2021 2:51 PM  Brandon [name]  has presented today for surgery, with the diagnosis of Sigmoid volvulus.  The various methods of treatment have been discussed with the patient and family. After consideration of risks, benefits and other options for treatment, the patient has consented to  Procedure(s): COLONOSCOPY WITH PROPOFOL (N/A) as a surgical intervention.  The patient's history has been reviewed, patient examined, no change in status, stable for surgery.  I have reviewed the patient's chart and labs.  Questions were answered to the patient's satisfaction.     [employer]

## 2021-05-07 NOTE — Progress Notes (Signed)
PROGRESS NOTE    Brandon [name]  UJW:[id number] DOB: 11/04/1945 DOA: 05/05/2021 PCP: Karl ItoPatel, Nilay, DO    Brief Narrative:  This 75 years old male with PMH significant for hypertension, bilateral lower extremity venous insufficiency, chronic kidney disease stage II and schizophrenia presented in the ED with intermittent abdominal pain for the last 3 days associated with vomiting.  CT abdomen and pelvis showed sigmoid volvulus,  moderate distention of the affected sigmoid colon,  moderate mesenteric edema involving sigmoid mesentery and mild ascites,  no free intraperitoneal gas or pneumatosis.  GI and general surgery consulted.  Patient underwent decompression. reduction of sigmoid volvulus.  Plan is to have colonoscopy followed by colectomy.  Assessment & Plan:   Principal Problem:   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(HCC) Active Problems:   HTN (hypertension)   Schizophrenia (HCC)  Acute sigmoid volvulus.  Patient presented with abdominal pain with vomiting.   Patient sp decompression/ reduction of sigmoid volvulus. At this point patient denies any nausea and vomiting. High risk for recurrence, surgery has been consulted with plans for partial colectomy. Patient is medically stable for surgical procedure. Patient is a scheduled to have colonoscopy today to evaluate rest of the colon. If colonoscopy negative patient will likely need sigmoid colectomy in next couple of days.  Hypertension :  patient tolerating po well post volvulus reduction, will discontinue IV fluids. Continue blood pressure monitoring. Continue losartan.  Hold furosemide for now.  Hyperlipidemia:  Continue with statin therapy.   Schizophrenia. Continue  bupropion, trifluperazine.   BPH  continue with tamsulosin and oxybutinin. no signs of urinary retention,   Patient continue to be at high risk for recurrent volvulus     DVT prophylaxis: Lovenox Code Status: Full code Family Communication: No family at bedside   Disposition Plan:   Consultants:  General surgery, GI  Procedures: Colonoscopy Antimicrobials: None  Subjective: Patient was seen and examined at bedside.  Overnight events noted.   Patient reports mild abdominal pain denies any nausea and vomiting.  Objective: Vitals:   05/07/21 1030 05/07/21 1347 05/07/21 1400 05/07/21 1422  BP: 137/72 (!) 155/75  (!) 158/89  Pulse: 71 80    Resp: 16 20  (!) 22  Temp:   98.2 F (36.8 C) (!) 96.6 F (35.9 C)  TempSrc:   Oral Temporal  SpO2: 97% 100%  98%  Weight:    99.8 kg  Height:    6\' 2"  (1.88 m)   No intake or output data in the 24 hours ending 05/07/21 1550 Filed Weights   05/05/21 2240 05/07/21 1422  Weight: 99.8 kg 99.8 kg    Examination:  General exam: Appears calm and comfortable , not in any acute distress. Respiratory system: Clear to auscultation. Respiratory effort normal. Cardiovascular system: S1 & S2 heard, RRR. No JVD, murmurs, rubs, gallops or clicks. No pedal edema. Gastrointestinal system: Abdomen is distended, soft and mildly tender. No organomegaly or masses felt. Normal bowel sounds heard. Central nervous system: Alert and oriented. No focal neurological deficits. Extremities: Symmetric 5 x 5 power. Skin: No rashes, lesions or ulcers Psychiatry: Judgement and insight appear normal. Mood & affect appropriate.     Data Reviewed: I have personally reviewed following labs and imaging studies  CBC: Recent Labs  Lab 05/05/21 2300 05/07/21 0900  WBC 10.6* 15.1*  NEUTROABS 9.3*  --   HGB 11.6* 12.9*  HCT 36.8* 40.7  MCV 85.0 87.2  PLT 170 184   Basic Metabolic Panel: Recent Labs  Lab 05/05/21 2300  05/07/21 0900  NA 138 141  K 3.4* 3.3*  CL 100 102  CO2 25 29  GLUCOSE 127* 92  BUN 27* 28*  CREATININE 1.16 1.22  CALCIUM 9.7 8.9  MG  --  2.1  PHOS  --  2.7   GFR: Estimated Creatinine Clearance: 68 mL/min (by C-G formula based on SCr of 1.22 mg/dL). Liver Function Tests: Recent Labs  Lab  05/07/21 0900  AST 48*  ALT 35  ALKPHOS 52  BILITOT 0.6  PROT 6.5  ALBUMIN 3.5   No results for input(s): LIPASE, AMYLASE in the last 168 hours. No results for input(s): AMMONIA in the last 168 hours. Coagulation Profile: Recent Labs  Lab 05/07/21 0900  INR 1.1   Cardiac Enzymes: No results for input(s): CKTOTAL, CKMB, CKMBINDEX, TROPONINI in the last 168 hours. BNP (last 3 results) No results for input(s): PROBNP in the last 8760 hours. HbA1C: No results for input(s): HGBA1C in the last 72 hours. CBG: No results for input(s): GLUCAP in the last 168 hours. Lipid Profile: No results for input(s): CHOL, HDL, LDLCALC, TRIG, CHOLHDL, LDLDIRECT in the last 72 hours. Thyroid Function Tests: No results for input(s): TSH, T4TOTAL, FREET4, T3FREE, THYROIDAB in the last 72 hours. Anemia Panel: No results for input(s): VITAMINB12, FOLATE, FERRITIN, TIBC, IRON, RETICCTPCT in the last 72 hours. Sepsis Labs: No results for input(s): PROCALCITON, LATICACIDVEN in the last 168 hours.  Recent Results (from the past 240 hour(s))  Resp Panel by RT-PCR (Flu A&B, Covid) Nasopharyngeal Swab     Status: None   Collection Time: 05/06/21  1:00 AM   Specimen: Nasopharyngeal Swab; Nasopharyngeal(NP) swabs in vial transport medium  Result Value Ref Range Status   SARS Coronavirus 2 by RT PCR NEGATIVE NEGATIVE Final    Comment: (NOTE) SARS-CoV-2 target nucleic acids are NOT DETECTED.  The SARS-CoV-2 RNA is generally detectable in upper respiratory specimens during the acute phase of infection. The lowest concentration of SARS-CoV-2 viral copies this assay can detect is 138 copies/mL. A negative result does not preclude SARS-Cov-2 infection and should not be used as the sole basis for treatment or other patient management decisions. A negative result may occur with  improper specimen collection/handling, submission of specimen other than nasopharyngeal swab, presence of viral mutation(s) within  the areas targeted by this assay, and inadequate number of viral copies(<138 copies/mL). A negative result must be combined with clinical observations, patient history, and epidemiological information. The expected result is Negative.  Fact Sheet for Patients:  BloggerCourse.com  Fact Sheet for Healthcare Providers:  SeriousBroker.it  This test is no t yet approved or cleared by the [place] FDA and  has been authorized for detection and/or diagnosis of SARS-CoV-2 by FDA under an Emergency Use Authorization (EUA). This EUA will remain  in effect (meaning this test can be used) for the duration of the COVID-19 declaration under Section 564(b)(1) of the Act, 21 U.S.C.section 360bbb-3(b)(1), unless the authorization is terminated  or revoked sooner.       Influenza A by PCR NEGATIVE NEGATIVE Final   Influenza B by PCR NEGATIVE NEGATIVE Final    Comment: (NOTE) The Xpert Xpress SARS-CoV-2/FLU/RSV plus assay is intended as an aid in the diagnosis of influenza from Nasopharyngeal swab specimens and should not be used as a sole basis for treatment. Nasal washings and aspirates are unacceptable for Xpert Xpress SARS-CoV-2/FLU/RSV testing.  Fact Sheet for Patients: BloggerCourse.com  Fact Sheet for Healthcare Providers: SeriousBroker.it  This test is not yet approved  or cleared by the [place] and has been authorized for detection and/or diagnosis of SARS-CoV-2 by FDA under an Emergency Use Authorization (EUA). This EUA will remain in effect (meaning this test can be used) for the duration of the COVID-19 declaration under Section 564(b)(1) of the Act, 21 U.S.C. section 360bbb-3(b)(1), unless the authorization is terminated or revoked.  Performed at [facility], [address], [place], [place] [address]      Radiology Studies: CT ABDOMEN  PELVIS WO CONTRAST  Result Date: 05/06/2021 CLINICAL DATA:  Bowel obstruction EXAM: CT ABDOMEN AND PELVIS WITHOUT CONTRAST TECHNIQUE: Multidetector CT imaging of the abdomen and pelvis was performed following the standard protocol without IV contrast. COMPARISON:  None. FINDINGS: Lower chest: The visualized lung bases are clear. Small hiatal hernia present. Fluid is seen within a distended distal esophagus likely reflecting changes of gastroesophageal reflux. Cardiac size within normal limits. No pericardial effusion. Hepatobiliary: No focal liver abnormality is seen. No gallstones, gallbladder wall thickening, or biliary dilatation. Pancreas: Unremarkable Spleen: Unremarkable Adrenals/Urinary Tract: Adrenal glands are unremarkable. Kidneys are normal, without renal calculi, focal lesion, or hydronephrosis. Bladder is unremarkable. Stomach/Bowel: There is sigmoid volvulus identified with distension of the involved colon up to 9 cm in greatest dimension. The mesenteric twist is best visualized within the mid abdomen axial image # 54/2. There is moderate mesenteric edema involving the affected redundant sigmoid colon. There is trace ascites present. No free intraperitoneal gas. No pneumatosis identified. The stomach and small bowel are unremarkable. Moderate stool seen within the more proximal colon. The appendix is unremarkable. Vascular/Lymphatic: Mild aortoiliac atherosclerotic calcification. No aortic aneurysm. No pathologic adenopathy within the abdomen and pelvis. Reproductive: Prostate is unremarkable. Small right varicocele noted. The right testicle appears located within the right inguinal canal. Other: Tiny fat containing umbilical hernia Musculoskeletal: Degenerative changes are seen within the lumbar spine. No lytic or blastic bone lesion identified. IMPRESSION: Sigmoid volvulus. Moderate distension of the affected sigmoid colon, moderate mesenteric edema involving the sigmoid mesentery, and mild ascites  present. No free intraperitoneal gas or pneumatosis to suggest bowel infarction or perforation. Small hiatal hernia. Fluid-filled distal esophagus in keeping with probable gastroesophageal reflux. Aortic Atherosclerosis (ICD10-I70.0). Electronically Signed   By: Helyn Numbers MD   On: 05/06/2021 00:17   DG Abd 1 View  Result Date: 05/05/2021 CLINICAL DATA:  Umbilical pain EXAM: ABDOMEN - 1 VIEW COMPARISON:  None. FINDINGS: Large amount of dilated bowel, predominantly in the right hemiabdomen. Large amount of ascending colonic stool. The cecum appears to be twisted and projecting toward the left upper quadrant. 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 CT abdomen and pelvis recommended for confirmation. I attempted to call critical Value/emergent results by telephone at the time of interpretation on 05/05/2021 at 9:16 pm to provider [facility], but the referring urgent care had closed and there was no on-call provider. According to their notes, they had referred the patient to the ER at [facility] for a CT scan. He had not arrived there at the time of this report. I also attempted to contact the patient on his home and mobile phones, but was not able to reach him. Electronically Signed   By: Deatra Robinson M.D.   On: 05/05/2021 21:18   DG Abd 2 Views  Result Date: 05/07/2021 CLINICAL DATA:  Encounter for polyp ileus of the sigmoid colon. EXAM: ABDOMEN - 2 VIEW COMPARISON:  One-view abdomen and CT abdomen pelvis 05/05/2021 FINDINGS: Dilated colon is partially decompressed compared to the prior exam.  Decreased stool burden noted. Multiple fluid levels again noted within both large and small bowel. IMPRESSION: 1. Partial decompression of colon with decreasing stool burden. 2. Fluid levels consistent with persistent obstruction/ileus Electronically Signed   By: Marin Roberts M.D.   On: 05/07/2021 14:00     Scheduled Meds:  [MAR Hold] buPROPion  150 mg Oral Daily   [MAR Hold] finasteride  5  mg Oral Daily   [MAR Hold] latanoprost  1 drop Both Eyes QHS   [MAR Hold] losartan  50 mg Oral Daily   [MAR Hold] oxybutynin  5 mg Oral Daily   [MAR Hold] pantoprazole  40 mg Oral Daily   [MAR Hold] rosuvastatin  5 mg Oral QHS   [MAR Hold] tamsulosin  0.4 mg Oral Daily   [MAR Hold] trifluoperazine  5 mg Oral QHS   Continuous Infusions:  sodium chloride     sodium chloride       LOS: 0 days    Time spent: 35 mins    Khara Renaud, MD [facility]   If 7PM-7AM, please contact night-coverage

## 2021-05-07 NOTE — ED Notes (Signed)
To Endo.

## 2021-05-07 NOTE — ED Notes (Signed)
Return from radiology after abd series

## 2021-05-07 NOTE — ED Notes (Signed)
Jillyn Ledger, RN was sent a secure message in regards to admission bed assignment [id number]. The room is currently being cleaned.

## 2021-05-07 NOTE — ED Notes (Signed)
Patient remains NPO for scheduled colonoscopy today.  No distress noted when checked.  Urinal emptied with 1000 ml.  BSC emptied with mucous type liquid noted

## 2021-05-07 NOTE — Transfer of Care (Signed)
Immediate Anesthesia Transfer of Care Note  Patient: Brandon [name]  Procedure(s) Performed: FLEXIBLE SIGMOIDOSCOPY BOWEL DECOMPRESSION  Patient Location: PACU and Endoscopy Unit  Anesthesia Type:General  Level of Consciousness: awake, drowsy and responds to stimulation  Airway & Oxygen Therapy: Patient Spontanous Breathing and Patient connected to face mask oxygen  Post-op Assessment: Report given to RN and Post -op Vital signs reviewed and stable  Post vital signs: Reviewed and stable  Last Vitals:  Vitals Value Taken Time  BP 117/66 05/07/21 1600  Temp    Pulse    Resp 17 05/07/21 1600  SpO2 100 % 05/07/21 1600    Last Pain:  Vitals:   05/07/21 1422  TempSrc: Temporal  PainSc: 6          Complications: No notable events documented.

## 2021-05-08 ENCOUNTER — Inpatient Hospital Stay (HOSPITAL_COMMUNITY): Payer: Medicare Other | Admitting: Certified Registered Nurse Anesthetist

## 2021-05-08 ENCOUNTER — Encounter (HOSPITAL_COMMUNITY)
Admission: EM | Disposition: A | Discharge status: Skilled Nursing Facility | Payer: Self-pay | Source: Home / Self Care | Attending: Family Medicine

## 2021-05-08 DIAGNOSIS — K562 Volvulus: Secondary | ICD-10-CM | POA: Diagnosis not present

## 2021-05-08 HISTORY — PX: LAPAROTOMY: SHX154

## 2021-05-08 LAB — CBC
HCT: 38.9 % — ABNORMAL LOW (ref 39.0–52.0)
Hemoglobin: 12.3 g/dL — ABNORMAL LOW (ref 13.0–17.0)
MCH: 27.3 pg (ref 26.0–34.0)
MCHC: 31.6 g/dL (ref 30.0–36.0)
MCV: 86.4 fL (ref 80.0–100.0)
Platelets: 190 10*3/uL (ref 150–400)
RBC: 4.5 MIL/uL (ref 4.22–5.81)
RDW: 16.3 % — ABNORMAL HIGH (ref 11.5–15.5)
WBC: 11.2 10*3/uL — ABNORMAL HIGH (ref 4.0–10.5)
nRBC: 0 % (ref 0.0–0.2)

## 2021-05-08 LAB — BASIC METABOLIC PANEL
Anion gap: 11 (ref 5–15)
BUN: 30 mg/dL — ABNORMAL HIGH (ref 8–23)
CO2: 26 mmol/L (ref 22–32)
Calcium: 8.4 mg/dL — ABNORMAL LOW (ref 8.9–10.3)
Chloride: 102 mmol/L (ref 98–111)
Creatinine, Ser: 1.09 mg/dL (ref 0.61–1.24)
GFR, Estimated: >60 mL/min (ref >60)
Glucose, Bld: 83 mg/dL (ref 70–99)
Potassium: 3.1 mmol/L — ABNORMAL LOW (ref 3.5–5.1)
Sodium: 139 mmol/L (ref 135–145)

## 2021-05-08 LAB — PHOSPHORUS: Phosphorus: 3.8 mg/dL (ref 2.5–4.6)

## 2021-05-08 LAB — MRSA NEXT GEN BY PCR, NASAL: MRSA by PCR Next Gen: NOT DETECTED

## 2021-05-08 LAB — MAGNESIUM: Magnesium: 2.1 mg/dL (ref 1.7–2.4)

## 2021-05-08 SURGERY — LAPAROTOMY, EXPLORATORY
Anesthesia: General | Site: Abdomen

## 2021-05-08 MED ORDER — ENSURE PRE-SURGERY PO LIQD: 592.0000 mL | Freq: Once | ORAL | Status: DC

## 2021-05-08 MED ORDER — ACETAMINOPHEN 500 MG PO TABS
ORAL_TABLET | ORAL | Status: AC
  Filled 2021-05-08: qty 2

## 2021-05-08 MED ORDER — ALVIMOPAN 12 MG PO CAPS
12.0000 mg | ORAL_CAPSULE | ORAL | Status: AC
Start: 2021-05-08 — End: 2021-05-08
  Administered 2021-05-08: 12 mg via ORAL

## 2021-05-08 MED ORDER — FENTANYL CITRATE (PF) 250 MCG/5ML IJ SOLN
INTRAMUSCULAR | Status: DC | PRN
  Administered 2021-05-08: 50 ug via INTRAVENOUS
  Administered 2021-05-08: 100 ug via INTRAVENOUS
  Administered 2021-05-08: 50 ug via INTRAVENOUS

## 2021-05-08 MED ORDER — DROPERIDOL 2.5 MG/ML IJ SOLN: 0.6250 mg | Freq: Once | INTRAMUSCULAR | Status: DC | PRN

## 2021-05-08 MED ORDER — FENTANYL CITRATE (PF) 250 MCG/5ML IJ SOLN
INTRAMUSCULAR | Status: AC
  Filled 2021-05-08: qty 5

## 2021-05-08 MED ORDER — PROPOFOL 10 MG/ML IV BOLUS
INTRAVENOUS | Status: AC
  Filled 2021-05-08: qty 40

## 2021-05-08 MED ORDER — POTASSIUM CHLORIDE 10 MEQ/100ML IV SOLN: 10.0000 meq | INTRAVENOUS | Status: AC

## 2021-05-08 MED ORDER — HYDROMORPHONE HCL 1 MG/ML IJ SOLN
0.5000 mg | INTRAMUSCULAR | Status: DC | PRN
Start: 2021-05-08 — End: 2021-05-09
  Administered 2021-05-08 – 2021-05-09 (×4): 0.5 mg via INTRAVENOUS
  Filled 2021-05-08 (×5): qty 0.5

## 2021-05-08 MED ORDER — BUPIVACAINE-EPINEPHRINE (PF) 0.25% -1:200000 IJ SOLN
INTRAMUSCULAR | Status: AC
  Filled 2021-05-08: qty 30

## 2021-05-08 MED ORDER — SUGAMMADEX SODIUM 200 MG/2ML IV SOLN
INTRAVENOUS | Status: DC | PRN
  Administered 2021-05-08: 200 mg via INTRAVENOUS

## 2021-05-08 MED ORDER — LIDOCAINE HCL 2 % IJ SOLN
INTRAMUSCULAR | Status: AC
  Filled 2021-05-08: qty 40

## 2021-05-08 MED ORDER — FENTANYL CITRATE (PF) 100 MCG/2ML IJ SOLN
25.0000 ug | INTRAMUSCULAR | Status: DC | PRN
  Administered 2021-05-08: 50 ug via INTRAVENOUS

## 2021-05-08 MED ORDER — ONDANSETRON HCL 4 MG/2ML IJ SOLN
INTRAMUSCULAR | Status: DC | PRN
  Administered 2021-05-08: 4 mg via INTRAVENOUS

## 2021-05-08 MED ORDER — CHLORHEXIDINE GLUCONATE CLOTH 2 % EX PADS: 6.0000 | MEDICATED_PAD | Freq: Once | CUTANEOUS | Status: DC

## 2021-05-08 MED ORDER — OXYCODONE HCL 5 MG/5ML PO SOLN: 5.0000 mg | Freq: Once | ORAL | Status: DC | PRN

## 2021-05-08 MED ORDER — ROCURONIUM BROMIDE 10 MG/ML (PF) SYRINGE
PREFILLED_SYRINGE | INTRAVENOUS | Status: DC | PRN
  Administered 2021-05-08: 30 mg via INTRAVENOUS
  Administered 2021-05-08: 60 mg via INTRAVENOUS

## 2021-05-08 MED ORDER — CEFOTETAN DISODIUM 2 G IJ SOLR: 2.0000 g | INTRAMUSCULAR | Status: DC

## 2021-05-08 MED ORDER — SODIUM CHLORIDE 0.9 % IV SOLN
2.0000 g | Freq: Once | INTRAVENOUS | Status: AC
  Administered 2021-05-08: 2 g via INTRAVENOUS
  Filled 2021-05-08: qty 2

## 2021-05-08 MED ORDER — LIDOCAINE 20MG/ML (2%) 15 ML SYRINGE OPTIME
INTRAMUSCULAR | Status: DC | PRN
  Administered 2021-05-08: 1.5 mg/kg/h via INTRAVENOUS

## 2021-05-08 MED ORDER — ENOXAPARIN SODIUM 40 MG/0.4ML IJ SOSY: 40.0000 mg | PREFILLED_SYRINGE | Freq: Once | INTRAMUSCULAR | Status: DC

## 2021-05-08 MED ORDER — CHLORHEXIDINE GLUCONATE CLOTH 2 % EX PADS
6.0000 | MEDICATED_PAD | Freq: Every day | CUTANEOUS | Status: DC
  Administered 2021-05-09 – 2021-05-12 (×3): 6 via TOPICAL

## 2021-05-08 MED ORDER — PROPOFOL 10 MG/ML IV BOLUS
INTRAVENOUS | Status: DC | PRN
  Administered 2021-05-08: 200 mg via INTRAVENOUS

## 2021-05-08 MED ORDER — OXYCODONE HCL 5 MG PO TABS: 5.0000 mg | ORAL_TABLET | Freq: Once | ORAL | Status: DC | PRN

## 2021-05-08 MED ORDER — ALVIMOPAN 12 MG PO CAPS
ORAL_CAPSULE | ORAL | Status: AC
  Filled 2021-05-08: qty 1

## 2021-05-08 MED ORDER — FENTANYL CITRATE (PF) 100 MCG/2ML IJ SOLN
INTRAMUSCULAR | Status: AC
  Filled 2021-05-08: qty 2

## 2021-05-08 MED ORDER — SODIUM CHLORIDE 0.9 % IR SOLN
Status: DC | PRN
  Administered 2021-05-08: 2000 mL

## 2021-05-08 MED ORDER — LIDOCAINE 2% (20 MG/ML) 5 ML SYRINGE
INTRAMUSCULAR | Status: DC | PRN
  Administered 2021-05-08: 100 mg via INTRAVENOUS

## 2021-05-08 MED ORDER — ALBUMIN HUMAN 5 % IV SOLN: INTRAVENOUS | Status: DC | PRN

## 2021-05-08 MED ORDER — LACTATED RINGERS IV SOLN: INTRAVENOUS | Status: DC | PRN

## 2021-05-08 MED ORDER — MIDAZOLAM HCL 2 MG/2ML IJ SOLN
INTRAMUSCULAR | Status: AC
  Filled 2021-05-08: qty 2

## 2021-05-08 MED ORDER — POTASSIUM CHLORIDE 10 MEQ/100ML IV SOLN
10.0000 meq | INTRAVENOUS | Status: AC
Start: 2021-05-08 — End: 2021-05-08
  Administered 2021-05-08 (×2): 10 meq via INTRAVENOUS
  Filled 2021-05-08 (×2): qty 100

## 2021-05-08 MED ORDER — ACETAMINOPHEN 500 MG PO TABS: 1000.0000 mg | ORAL_TABLET | Freq: Once | ORAL | Status: DC

## 2021-05-08 MED ORDER — ACETAMINOPHEN 500 MG PO TABS
1000.0000 mg | ORAL_TABLET | ORAL | Status: AC
  Administered 2021-05-08: 1000 mg via ORAL

## 2021-05-08 MED ORDER — POTASSIUM CHLORIDE 10 MEQ/100ML IV SOLN
INTRAVENOUS | Status: AC
  Administered 2021-05-08: 10 meq
  Filled 2021-05-08: qty 100

## 2021-05-08 MED ORDER — ENSURE PRE-SURGERY PO LIQD: 296.0000 mL | Freq: Once | ORAL | Status: DC

## 2021-05-08 SURGICAL SUPPLY — 46 items
APPLICATOR COTTON TIP 6 STRL (MISCELLANEOUS) ×2 IMPLANT
APPLICATOR COTTON TIP 6IN STRL (MISCELLANEOUS) ×3 IMPLANT
APPLIER CLIP 5 13 M/L LIGAMAX5 (MISCELLANEOUS)
APPLIER CLIP ROT 10 11.4 M/L (STAPLE)
BAG COUNTER SPONGE SURGICOUNT (BAG) IMPLANT
CELLS DAT CNTRL 66122 CELL SVR (MISCELLANEOUS) IMPLANT
CLIP APPLIE 5 13 M/L LIGAMAX5 (MISCELLANEOUS) IMPLANT
CLIP APPLIE ROT 10 11.4 M/L (STAPLE) IMPLANT
COUNTER NEEDLE 20 DBL MAG RED (NEEDLE) ×3 IMPLANT
DERMABOND ADVANCED (GAUZE/BANDAGES/DRESSINGS)
DERMABOND ADVANCED .7 DNX12 (GAUZE/BANDAGES/DRESSINGS) IMPLANT
DRSG OPSITE POSTOP 4X10 (GAUZE/BANDAGES/DRESSINGS) IMPLANT
DRSG OPSITE POSTOP 4X6 (GAUZE/BANDAGES/DRESSINGS) IMPLANT
DRSG OPSITE POSTOP 4X8 (GAUZE/BANDAGES/DRESSINGS) ×3 IMPLANT
ELECT REM PT RETURN 15FT ADLT (MISCELLANEOUS) ×3 IMPLANT
GAUZE SPONGE 2X2 8PLY STRL LF (GAUZE/BANDAGES/DRESSINGS) IMPLANT
GAUZE SPONGE 4X4 12PLY STRL (GAUZE/BANDAGES/DRESSINGS) ×3 IMPLANT
GLOVE SURG ENC MOIS LTX SZ7.5 (GLOVE) ×6 IMPLANT
GLOVE SURG UNDER POLY LF SZ7 (GLOVE) ×3 IMPLANT
GOWN STRL REUS W/ TWL XL LVL3 (GOWN DISPOSABLE) ×2 IMPLANT
GOWN STRL REUS W/TWL LRG LVL3 (GOWN DISPOSABLE) ×3 IMPLANT
GOWN STRL REUS W/TWL XL LVL3 (GOWN DISPOSABLE) ×15 IMPLANT
KIT TURNOVER KIT A (KITS) ×3 IMPLANT
LEGGING LITHOTOMY PAIR STRL (DRAPES) ×3 IMPLANT
LIGASURE IMPACT 36 18CM CVD LR (INSTRUMENTS) ×3 IMPLANT
PACK COLON (CUSTOM PROCEDURE TRAY) ×3 IMPLANT
RELOAD PROXIMATE 75MM BLUE (ENDOMECHANICALS) ×6 IMPLANT
RTRCTR WOUND ALEXIS 18CM MED (MISCELLANEOUS)
SPONGE GAUZE 2X2 STER 10/PKG (GAUZE/BANDAGES/DRESSINGS)
SPONGE T-LAP 18X18 ~~LOC~~+RFID (SPONGE) ×6 IMPLANT
STAPLER PROXIMATE 75MM BLUE (STAPLE) ×3 IMPLANT
STAPLER VISISTAT 35W (STAPLE) ×3 IMPLANT
SUT PDS AB 1 CTX 36 (SUTURE) IMPLANT
SUT PDS AB 1 TP1 96 (SUTURE) IMPLANT
SUT PROLENE 2 0 SH DA (SUTURE) ×3 IMPLANT
SUT SILK 2 0 (SUTURE) ×3
SUT SILK 2 0 SH CR/8 (SUTURE) ×3 IMPLANT
SUT SILK 2-0 18XBRD TIE 12 (SUTURE) ×2 IMPLANT
SUT SILK 3 0 (SUTURE) ×3
SUT SILK 3 0 SH CR/8 (SUTURE) ×3 IMPLANT
SUT SILK 3-0 18XBRD TIE 12 (SUTURE) ×2 IMPLANT
TOWEL OR 17X26 10 PK STRL BLUE (TOWEL DISPOSABLE) ×6 IMPLANT
TOWEL OR NON WOVEN STRL DISP B (DISPOSABLE) ×3 IMPLANT
TRAY FOLEY MTR SLVR 14FR STAT (SET/KITS/TRAYS/PACK) ×3 IMPLANT
TRAY FOLEY MTR SLVR 16FR STAT (SET/KITS/TRAYS/PACK) IMPLANT
TUBING CONNECTING 10 (TUBING) ×6 IMPLANT

## 2021-05-08 NOTE — Progress Notes (Signed)
Patient ID: Brandon [name], male   DOB: May 08, 1946, 75 y.o.   MRN: [id number] I spoke with family and again discussed the risks and benefits of the surgery as well as some of the technical aspects including the need for an ostomy and they understand and wish to proceed

## 2021-05-08 NOTE — Transfer of Care (Signed)
Immediate Anesthesia Transfer of Care Note  Patient: Brandon [name]  Procedure(s) Performed: EXPLORATORY LAPAROTOMY, SIGMOID COLECTOMY, COLOSTOMY (Abdomen)  Patient Location: PACU  Anesthesia Type:General  Level of Consciousness: awake, oriented and patient cooperative  Airway & Oxygen Therapy: Patient Spontanous Breathing and Patient connected to face mask oxygen  Post-op Assessment: Report given to RN and Post -op Vital signs reviewed and stable  Post vital signs: stable  Last Vitals:  Vitals Value Taken Time  BP 138/85 05/08/21 1304  Temp    Pulse 77 05/08/21 1313  Resp 25 05/08/21 1313  SpO2 100 % 05/08/21 1313  Vitals shown include unvalidated device data.  Last Pain:  Vitals:   05/08/21 0503  TempSrc: Oral  PainSc:          Complications: No notable events documented.

## 2021-05-08 NOTE — Anesthesia Postprocedure Evaluation (Signed)
Anesthesia Post Note  Patient: Brandon [name]  Procedure(s) Performed: EXPLORATORY LAPAROTOMY, SIGMOID COLECTOMY, COLOSTOMY (Abdomen)     Patient location during evaluation: PACU Anesthesia Type: General Level of consciousness: awake and alert and oriented Pain management: pain level controlled Vital Signs Assessment: post-procedure vital signs reviewed and stable Respiratory status: spontaneous breathing, nonlabored ventilation and respiratory function stable Cardiovascular status: blood pressure returned to baseline Postop Assessment: no apparent nausea or vomiting Anesthetic complications: no   No notable events documented.  Last Vitals:  Vitals:   05/08/21 1330 05/08/21 1345  BP: (!) 143/77 (!) 152/77  Pulse: 64 71  Resp: 18 (!) 25  Temp: 36.6 C 36.4 C  SpO2: 100% 100%    Last Pain:  Vitals:   05/08/21 1352  TempSrc:   PainSc: Asleep                 Kaylyn Layer

## 2021-05-08 NOTE — Progress Notes (Addendum)
As per nighttime RN patient is alert to self and situation however forgetful. When asked this morning if he know he will have surgery today pt. answered "im not sure". Discussed about getting a consent, pt. agreed to call sister. RN tried calling sister Gavin Pound for telephone consent but no answer. We will reattempt and try again.   [id number] verbal consent thru phone given by sisters Efraim Kaufmann and Gavin Pound witnessed by [employer]

## 2021-05-08 NOTE — Anesthesia Procedure Notes (Signed)
Procedure Name: Intubation Date/Time: 05/08/2021 11:21 AM Performed by: Donna Bernard, CRNA Pre-anesthesia Checklist: Patient identified, Emergency Drugs available, Suction available, Patient being monitored and Timeout performed Patient Re-evaluated:Patient Re-evaluated prior to induction Oxygen Delivery Method: Circle system utilized Preoxygenation: Pre-oxygenation with 100% oxygen Induction Type: IV induction Ventilation: Mask ventilation without difficulty Laryngoscope Size: Miller and 3 Grade View: Grade II Tube type: Oral Tube size: 7.5 mm Number of attempts: 1 Airway Equipment and Method: Stylet Placement Confirmation: positive ETCO2, ETT inserted through vocal cords under direct vision, CO2 detector and breath sounds checked- equal and bilateral Secured at: 23 cm Tube secured with: Tape Dental Injury: Teeth and Oropharynx as per pre-operative assessment

## 2021-05-08 NOTE — Progress Notes (Signed)
[facility] ROUNDING NOTE   Subjective: No acute events overnight.  Attempted repeat colonoscopy yesterday, but suboptimal bowel prep and recurrence of sigmoid volvulus precluded advancement into the right colon.  Rectal tube replaced for decompression.  Plan for operating room today with Dr. Carolynne Edouard.  Patient otherwise without any complaints.  Denies any abdominal pain.  Objective: Vital signs in last 24 hours: Temp:  [96.6 F (35.9 C)-98.5 F (36.9 C)] 98.5 F (36.9 C) (07/23 0503) Pulse Rate:  [71-104] 104 (07/23 0503) Resp:  [14-22] 20 (07/23 0503) BP: (116-158)/(66-102) 143/86 (07/23 0503) SpO2:  [95 %-100 %] 99 % (07/23 0503) Weight:  [99.8 kg] 99.8 kg (07/22 1422) Last BM Date: 05/07/21 General: NAD Lungs:  CTA b/l, no w/r/r Heart:  RRR, no m/r/g Abdomen: Hypoactive bowel sounds, soft, NT    Intake/Output from previous day: 07/22 0701 - 07/23 0700 In: 670 [I.V.:670] Out: -  Intake/Output this shift: No intake/output data recorded.   Lab Results: Recent Labs    05/05/21 2300 05/07/21 0900 05/08/21 0515  WBC 10.6* 15.1* 11.2*  HGB 11.6* 12.9* 12.3*  PLT 170 184 190  MCV 85.0 87.2 86.4   BMET Recent Labs    05/05/21 2300 05/07/21 0900 05/08/21 0515  NA 138 141 139  K 3.4* 3.3* 3.1*  CL 100 102 102  CO2 25 29 26   GLUCOSE 127* 92 83  BUN 27* 28* 30*  CREATININE 1.16 1.22 1.09  CALCIUM 9.7 8.9 8.4*   LFT Recent Labs    05/07/21 0900  PROT 6.5  ALBUMIN 3.5  AST 48*  ALT 35  ALKPHOS 52  BILITOT 0.6   PT/INR Recent Labs    05/07/21 0900  INR 1.1      Imaging/Other results: DG Abd 2 Views  Result Date: 05/07/2021 CLINICAL DATA:  Encounter for polyp ileus of the sigmoid colon. EXAM: ABDOMEN - 2 VIEW COMPARISON:  One-view abdomen and CT abdomen pelvis 05/05/2021 FINDINGS: Dilated colon is partially decompressed compared to the prior exam. Decreased stool burden noted. Multiple fluid levels again noted within both large and small  bowel. IMPRESSION: 1. Partial decompression of colon with decreasing stool burden. 2. Fluid levels consistent with persistent obstruction/ileus Electronically Signed   By: 05/07/2021 M.D.   On: 05/07/2021 14:00      Assessment and Plan:  1) 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- Flexible sigmoidoscopy x2 with colonic tube decompression - Patient scheduled for OR this morning for definitive management - Will eventually need to consider outpatient colonoscopy for evaluation of right colon after recovery of acute condition and pending surgery - GI service will sign off at this time.  Please do not hesitate to contact with additional questions or concerns    05/09/2021, DO  05/08/2021, 8:31 AM [facility] Pager [phone]

## 2021-05-08 NOTE — Op Note (Addendum)
05/05/2021 - 05/08/2021  12:45 PM  PATIENT:  Brandon [name]  75 y.o. male  PRE-OPERATIVE DIAGNOSIS:  volvulus of sigmoid colon  POST-OPERATIVE DIAGNOSIS:  volvulus of sigmoid colon  PROCEDURE:  Procedure(s): EXPLORATORY LAPAROTOMY, SIGMOID COLECTOMY, COLOSTOMY (N/A)  SURGEON:  Surgeon(s) and Role:    * Griselda Miner, MD - Primary    * Abigail Miyamoto, MD - Assisting  PHYSICIAN ASSISTANT:   ASSISTANTS: Dr. Magnus Ivan   ANESTHESIA:   general  EBL:  minimal   BLOOD ADMINISTERED:none  DRAINS: none   LOCAL MEDICATIONS USED:  NONE  SPECIMEN:  Source of Specimen:  sigmoid colon  DISPOSITION OF SPECIMEN:  PATHOLOGY  COUNTS:  YES  TOURNIQUET:  * No tourniquets in log *  DICTATION: .Dragon Dictation  After informed consent was obtained the patient was brought to the operating room and placed in the supine position on the operating table.  After adequate induction of general anesthesia the patient was placed in lithotomy position and all pressure points were padded.  The abdomen was then prepped with ChloraPrep and the perineum prepped with Betadine, allowed to dry, and draped in usual sterile manner.  An appropriate timeout was performed.  A midline incision was made with a 10 blade knife.  The incision was carried through the skin and subcutaneous tissue sharply with the electrocautery into the linea alba was identified.  The linea alba was incised also with the electrocautery.  The preperitoneal space was probed bluntly with a hemostat until the peritoneum was opened and access was gained to the abdominal cavity.  The rest of the incision was opened under direct vision.  The abdomen was generally inspected and no significant abnormalities were noted other than a very large redundant sigmoid colon.  All of the colon was fairly dilated though.  Because of the size and distention of the entire colon we elected to resect the area of volvulus and bring out a proximal end colostomy.  The  site was chosen above and below the area of volvulus.  The mesentery at each of these 2 points was opened sharply with the electrocautery.  A GIA-75 stapler was placed across the colon at each of these points, clamped, and fired thereby dividing the colon and rectum between staple lines.  The descending colon was then mobilized by incising its retroperitoneal attachment along the white line of Toldt.  After doing this there was good mobility to the descending colon.  The edge of the rectal stump was marked with a 2-0 Prolene stitch and the staple line was controlled because of some oozing with multiple interrupted 2-0 silk figure-of-eight stitches.  The abdomen was then irrigated with copious amounts of saline.  A site was chosen on the left side of the abdominal wall for bringing out the ostomy.  A small circle of skin and subcutaneous tissue was removed sharply with the electrocautery.  A cruciate incision was made in the fascia of the anterior abdominal wall so that 2 fingers could be brought through the opening.  A Babcock was placed through this opening and used to grasp the staple line of the descending colon and bring it out through the abdominal wall.  The colon reached easily without tension.  The fascia of the anterior abdominal wall was then closed with 2 running #1 double-stranded looped PDS sutures.  The subcutaneous tissue was irrigated with copious amounts of saline.  The skin was closed with staples.  The skin was then covered with a sterile green towel.  The staple line of the ostomy was then removed sharply with the electrocautery.  The ostomy was matured with interrupted 3-0 Vicryl stitches.  The ostomy appeared pink and healthy.  Finger was placed into the ostomy and the ostomy was patent with no obstruction.  A ostomy bag and sterile dressings were then applied.  The patient tolerated the procedure well.  At the end of the case all needle sponge and instrument counts were correct.  The patient  was then awakened and taken recovery in stable condition.  The assistant was crucial for completion of the case.  PLAN OF CARE: Admit to inpatient   PATIENT DISPOSITION:  PACU - hemodynamically stable.   Delay start of Pharmacological VTE agent (>24hrs) due to surgical blood loss or risk of bleeding: no

## 2021-05-08 NOTE — Progress Notes (Signed)
PROGRESS NOTE    Brandon [name]  BHA:[id number] DOB: 11-19-1945 DOA: 05/05/2021 PCP: Karl Ito, DO    Brief Narrative:  This 75 years old male with PMH significant for hypertension, bilateral lower extremity venous insufficiency, chronic kidney disease stage II and schizophrenia presented in the ED with intermittent abdominal pain for the last 3 days associated with vomiting.  CT abdomen and pelvis showed sigmoid volvulus,  moderate distention of the affected sigmoid colon,  moderate mesenteric edema involving sigmoid mesentery and mild ascites,  no free intraperitoneal gas or pneumatosis.  GI and general surgery consulted.  Patient underwent decompression. reduction of sigmoid volvulus.  Plan is to have colonoscopy followed by colectomy.  Assessment & Plan:   Principal Problem:   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(HCC) Active Problems:   HTN (hypertension)   Schizophrenia (HCC)  Acute sigmoid volvulus.  Patient presented with abdominal pain, distention with vomiting.   Patient s/p decompression/ reduction of sigmoid volvulus. At this point patient denies any nausea and vomiting. High risk for recurrence, surgery has been consulted with plans for partial colectomy. Patient is medically stable for surgical procedure. Patient underwent colonoscopy but was suboptimal preparation.  Rectal tube was placed for decompression. Patient underwent exploratory laparotomy, sigmoid colectomy and colostomy placement today by general surgery.  Tolerated well. Patient will remain n.p.o. until able to tolerate p.o.  Hypertension :  Hold p.o. blood pressure medications. Continue to monitor blood pressure Hold losartan.  Hold furosemide for now.  Hyperlipidemia:  Continue with statin therapy.   Schizophrenia.  Continue  bupropion, trifluperazine.   BPH:  continue with tamsulosin and oxybutinin. no signs of urinary retention,     DVT prophylaxis: Lovenox Code Status: Full code Family Communication: No  family at bedside  Disposition Plan:   Consultants:  General surgery, GI  Procedures: Colonoscopy Antimicrobials: None  Subjective: Patient was seen and examined at bedside.  Overnight events noted.   Patient is s/p laparotomy with sigmoidectomy and colostomy placement.  Objective: Vitals:   05/08/21 1320 05/08/21 1330 05/08/21 1345 05/08/21 1428  BP:  (!) 143/77 (!) 152/77 134/75  Pulse: 75 64 71 80  Resp: (!) 28 18 (!) 25 18  Temp:  97.8 F (36.6 C) 97.6 F (36.4 C) (!) 97.5 F (36.4 C)  TempSrc:    Oral  SpO2: 100% 100% 100% 100%  Weight:      Height:        Intake/Output Summary (Last 24 hours) at 05/08/2021 1454 Last data filed at 05/08/2021 1316 Gross per 24 hour  Intake 2120 ml  Output 800 ml  Net 1320 ml   Filed Weights   05/05/21 2240 05/07/21 1422  Weight: 99.8 kg 99.8 kg    Examination:  General exam: Appears calm and comfortable , appears in a lot of pain.  Deconditioned Respiratory system: Clear to auscultation. Respiratory effort normal. Cardiovascular system: S1 & S2 heard, RRR. No JVD, murmurs, rubs, gallops or clicks. No pedal edema. Gastrointestinal system: Abdomen is distended, soft and mildly tender, dressing noted. Central nervous system: Alert and oriented. No focal neurological deficits. Extremities: Symmetric 5 x 5 power. Skin: No rashes, lesions or ulcers Psychiatry: Judgement and insight appear normal. Mood & affect appropriate.     Data Reviewed: I have personally reviewed following labs and imaging studies  CBC: Recent Labs  Lab 05/05/21 2300 05/07/21 0900 05/08/21 0515  WBC 10.6* 15.1* 11.2*  NEUTROABS 9.3*  --   --   HGB 11.6* 12.9* 12.3*  HCT 36.8* 40.7  38.9*  MCV 85.0 87.2 86.4  PLT 170 184 190   Basic Metabolic Panel: Recent Labs  Lab 05/05/21 2300 05/07/21 0900 05/08/21 0515  NA 138 141 139  K 3.4* 3.3* 3.1*  CL 100 102 102  CO2 25 29 26   GLUCOSE 127* 92 83  BUN 27* 28* 30*  CREATININE 1.16 1.22 1.09   CALCIUM 9.7 8.9 8.4*  MG  --  2.1 2.1  PHOS  --  2.7 3.8   GFR: Estimated Creatinine Clearance: 76.2 mL/min (by C-G formula based on SCr of 1.09 mg/dL). Liver Function Tests: Recent Labs  Lab 05/07/21 0900  AST 48*  ALT 35  ALKPHOS 52  BILITOT 0.6  PROT 6.5  ALBUMIN 3.5   No results for input(s): LIPASE, AMYLASE in the last 168 hours. No results for input(s): AMMONIA in the last 168 hours. Coagulation Profile: Recent Labs  Lab 05/07/21 0900  INR 1.1   Cardiac Enzymes: No results for input(s): CKTOTAL, CKMB, CKMBINDEX, TROPONINI in the last 168 hours. BNP (last 3 results) No results for input(s): PROBNP in the last 8760 hours. HbA1C: No results for input(s): HGBA1C in the last 72 hours. CBG: No results for input(s): GLUCAP in the last 168 hours. Lipid Profile: No results for input(s): CHOL, HDL, LDLCALC, TRIG, CHOLHDL, LDLDIRECT in the last 72 hours. Thyroid Function Tests: No results for input(s): TSH, T4TOTAL, FREET4, T3FREE, THYROIDAB in the last 72 hours. Anemia Panel: No results for input(s): VITAMINB12, FOLATE, FERRITIN, TIBC, IRON, RETICCTPCT in the last 72 hours. Sepsis Labs: No results for input(s): PROCALCITON, LATICACIDVEN in the last 168 hours.  Recent Results (from the past 240 hour(s))  Resp Panel by RT-PCR (Flu A&B, Covid) Nasopharyngeal Swab     Status: None   Collection Time: 05/06/21  1:00 AM   Specimen: Nasopharyngeal Swab; Nasopharyngeal(NP) swabs in vial transport medium  Result Value Ref Range Status   SARS Coronavirus 2 by RT PCR NEGATIVE NEGATIVE Final    Comment: (NOTE) SARS-CoV-2 target nucleic acids are NOT DETECTED.  The SARS-CoV-2 RNA is generally detectable in upper respiratory specimens during the acute phase of infection. The lowest concentration of SARS-CoV-2 viral copies this assay can detect is 138 copies/mL. A negative result does not preclude SARS-Cov-2 infection and should not be used as the sole basis for treatment  or other patient management decisions. A negative result may occur with  improper specimen collection/handling, submission of specimen other than nasopharyngeal swab, presence of viral mutation(s) within the areas targeted by this assay, and inadequate number of viral copies(<138 copies/mL). A negative result must be combined with clinical observations, patient history, and epidemiological information. The expected result is Negative.  Fact Sheet for Patients:  05/08/21  Fact Sheet for Healthcare Providers:  BloggerCourse.com  This test is no t yet approved or cleared by the [employer] FDA and  has been authorized for detection and/or diagnosis of SARS-CoV-2 by FDA under an Emergency Use Authorization (EUA). This EUA will remain  in effect (meaning this test can be used) for the duration of the COVID-19 declaration under Section 564(b)(1) of the Act, 21 U.S.C.section 360bbb-3(b)(1), unless the authorization is terminated  or revoked sooner.       Influenza A by PCR NEGATIVE NEGATIVE Final   Influenza B by PCR NEGATIVE NEGATIVE Final    Comment: (NOTE) The Xpert Xpress SARS-CoV-2/FLU/RSV plus assay is intended as an aid in the diagnosis of influenza from Nasopharyngeal swab specimens and should not be used as a sole  basis for treatment. Nasal washings and aspirates are unacceptable for Xpert Xpress SARS-CoV-2/FLU/RSV testing.  Fact Sheet for Patients: BloggerCourse.com  Fact Sheet for Healthcare Providers: [employer]ious[employer]ker.it  This test is not yet approved or cleared by the [place] FDA and has been authorized for detection and/or diagnosis of SARS-CoV-2 by FDA under an Emergency Use Authorization (EUA). This EUA will remain in effect (meaning this test can be used) for the duration of the COVID-19 declaration under Section 564(b)(1) of the Act, 21 U.S.C. section  360bbb-3(b)(1), unless the authorization is terminated or revoked.  Performed at [facility], [address], [place], [place] [address]   MRSA Next Gen by PCR, Nasal     Status: None   Collection Time: 05/08/21  3:53 AM   Specimen: Nasal Mucosa; Nasal Swab  Result Value Ref Range Status   MRSA by PCR Next Gen NOT DETECTED NOT DETECTED Final    Comment: (NOTE) The GeneXpert MRSA Assay (FDA approved for NASAL specimens only), is one component of a comprehensive MRSA colonization surveillance program. It is not intended to diagnose MRSA infection nor to guide or monitor treatment for MRSA infections. Test performance is not FDA approved in patients less than 78 years old. Performed at [facility], [address], [place], [place] [address]      Radiology Studies: DG Abd 2 Views  Result Date: 05/07/2021 CLINICAL DATA:  Encounter for polyp ileus of the sigmoid colon. EXAM: ABDOMEN - 2 VIEW COMPARISON:  One-view abdomen and CT abdomen pelvis 05/05/2021 FINDINGS: Dilated colon is partially decompressed compared to the prior exam. Decreased stool burden noted. Multiple fluid levels again noted within both large and small bowel. IMPRESSION: 1. Partial decompression of colon with decreasing stool burden. 2. Fluid levels consistent with persistent obstruction/ileus Electronically Signed   By: Marin Roberts M.D.   On: 05/07/2021 14:00     Scheduled Meds:  buPROPion  150 mg Oral Daily   Chlorhexidine Gluconate Cloth  6 each Topical Daily   fentaNYL       finasteride  5 mg Oral Daily   latanoprost  1 drop Both Eyes QHS   losartan  50 mg Oral Daily   oxybutynin  5 mg Oral Daily   pantoprazole  40 mg Oral Daily   rosuvastatin  5 mg Oral QHS   tamsulosin  0.4 mg Oral Daily   trifluoperazine  5 mg Oral QHS   Continuous Infusions:     LOS: 1 day    Time spent: 25 mins    Cipriano Bunker, MD [facility]   If 7PM-7AM, please  contact night-coverage

## 2021-05-08 NOTE — Progress Notes (Addendum)
pt. pulled off the dressing and his new colostomy bag, staples are in placed, stoma site red and intact dressing. Colostomy bag were changed, small wound dehisence noted, steristrips applied. Applied abdominal sheet binder to cover surgical wound and ostomy. MD and surgery was notified.

## 2021-05-08 NOTE — Plan of Care (Signed)

## 2021-05-09 ENCOUNTER — Encounter (HOSPITAL_COMMUNITY): Payer: Self-pay | Admitting: General Surgery

## 2021-05-09 DIAGNOSIS — K562 Volvulus: Secondary | ICD-10-CM | POA: Diagnosis not present

## 2021-05-09 LAB — BASIC METABOLIC PANEL
Anion gap: 8 (ref 5–15)
BUN: 29 mg/dL — ABNORMAL HIGH (ref 8–23)
CO2: 26 mmol/L (ref 22–32)
Calcium: 8.2 mg/dL — ABNORMAL LOW (ref 8.9–10.3)
Chloride: 103 mmol/L (ref 98–111)
Creatinine, Ser: 1.17 mg/dL (ref 0.61–1.24)
GFR, Estimated: >60 mL/min (ref >60)
Glucose, Bld: 93 mg/dL (ref 70–99)
Potassium: 3.5 mmol/L (ref 3.5–5.1)
Sodium: 137 mmol/L (ref 135–145)

## 2021-05-09 MED ORDER — HYDROMORPHONE HCL 1 MG/ML IJ SOLN
1.0000 mg | INTRAMUSCULAR | Status: DC | PRN
  Administered 2021-05-09 – 2021-05-10 (×5): 1 mg via INTRAVENOUS
  Filled 2021-05-09 (×5): qty 1

## 2021-05-09 MED ORDER — ADULT MULTIVITAMIN W/MINERALS CH
1.0000 | ORAL_TABLET | Freq: Every day | ORAL | Status: DC
  Administered 2021-05-09 – 2021-05-13 (×5): 1 via ORAL
  Filled 2021-05-09 (×5): qty 1

## 2021-05-09 MED ORDER — BOOST / RESOURCE BREEZE PO LIQD CUSTOM
1.0000 | Freq: Three times a day (TID) | ORAL | Status: DC
  Administered 2021-05-09 – 2021-05-13 (×11): 1 via ORAL

## 2021-05-09 NOTE — Progress Notes (Signed)
PROGRESS NOTE    Brandon [name]  JME:[id number] DOB: October 04, 1946 DOA: 05/05/2021 PCP: Karl Ito, DO    Brief Narrative:  This 75 years old male with PMH significant for hypertension, bilateral lower extremity venous insufficiency, chronic kidney disease stage II and schizophrenia presented in the ED with intermittent abdominal pain for the last 3 days associated with vomiting.  CT abdomen and pelvis showed sigmoid volvulus,  moderate distention of the affected sigmoid colon,  moderate mesenteric edema involving sigmoid mesentery and mild ascites,  no free intraperitoneal gas or pneumatosis.  GI and general surgery consulted.  Patient underwent decompression. reduction of sigmoid volvulus.  Plan is to have colonoscopy followed by colectomy.  Assessment & Plan:   Principal Problem:   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(HCC) Active Problems:   HTN (hypertension)   Schizophrenia (HCC)  Acute sigmoid volvulus.  Patient presented with abdominal pain, distention with vomiting.   Patient s/p decompression/ reduction of sigmoid volvulus. At this point patient denies any nausea and vomiting. High risk for recurrence, surgery has been consulted with plans for partial colectomy. Patient is medically stable for surgical procedure. Patient underwent colonoscopy but was suboptimal preparation.  Rectal tube was placed for decompression. Patient underwent exploratory laparotomy, sigmoid colectomy and colostomy placement  on 7/23 by general surgery.  Postoperative day 1.  Tolerated well. Adequate pain control with Dilaudid as needed. General surgery recommended to start clear liquid diet.  Hypertension :  Hold p.o. blood pressure medications. Continue to monitor blood pressure Hold losartan.  Hold furosemide for now.  Hyperlipidemia:  Continue with statin therapy.   Schizophrenia.  Continue  bupropion, trifluperazine.   BPH:  Continue with tamsulosin and oxybutinin. no signs of urinary retention,     DVT  prophylaxis: Lovenox Code Status: Full code Family Communication: No family at bedside  Disposition Plan:   Consultants:  General surgery, GI  Procedures: Colonoscopy Antimicrobials: None  Subjective: Patient was seen and examined at bedside.  Overnight events noted.   Patient s/p laparotomy with sigmoidectomy and colostomy placement , postoperative day 1. Patient has pulled colostomy bag and scraped the laparotomy dressing.  Dressing changed.   Patient reports having pain,  asks for stronger pain medication.  Objective: Vitals:   05/08/21 1345 05/08/21 1428 05/08/21 2036 05/09/21 0515  BP: (!) 152/77 134/75 127/67 127/70  Pulse: 71 80 96 98  Resp: (!) 25 18 18 18   Temp: 97.6 F (36.4 C) (!) 97.5 F (36.4 C) 98.5 F (36.9 C) 97.8 F (36.6 C)  TempSrc:  Oral Oral Oral  SpO2: 100% 100% 100% 100%  Weight:      Height:        Intake/Output Summary (Last 24 hours) at 05/09/2021 1323 Last data filed at 05/09/2021 0900 Gross per 24 hour  Intake 461.36 ml  Output 650 ml  Net -188.64 ml   Filed Weights   05/05/21 2240 05/07/21 1422  Weight: 99.8 kg 99.8 kg    Examination:  General exam: Appears calm and comfortable , appears in a lot of pain.  Deconditioned Respiratory system: Clear to auscultation. Respiratory effort normal. Cardiovascular system: S1 & S2 heard, RRR. No JVD, murmurs, rubs, gallops or clicks. No pedal edema. Gastrointestinal system: Abdomen is distended, soft and mildly tender, dressing noted.Colostomy bag on left side. Central nervous system: Alert and oriented x2. No focal neurological deficits. Extremities: Symmetric 5 x 5 power. Skin: No rashes, lesions or ulcers Psychiatry: Judgement and insight appear normal. Mood & affect appropriate.  Data Reviewed: I have personally reviewed following labs and imaging studies  CBC: Recent Labs  Lab 05/05/21 2300 05/07/21 0900 05/08/21 0515  WBC 10.6* 15.1* 11.2*  NEUTROABS 9.3*  --   --   HGB  11.6* 12.9* 12.3*  HCT 36.8* 40.7 38.9*  MCV 85.0 87.2 86.4  PLT 170 184 190   Basic Metabolic Panel: Recent Labs  Lab 05/05/21 2300 05/07/21 0900 05/08/21 0515 05/09/21 0625  NA 138 141 139 137  K 3.4* 3.3* 3.1* 3.5  CL 100 102 102 103  CO2 25 29 26 26   GLUCOSE 127* 92 83 93  BUN 27* 28* 30* 29*  CREATININE 1.16 1.22 1.09 1.17  CALCIUM 9.7 8.9 8.4* 8.2*  MG  --  2.1 2.1  --   PHOS  --  2.7 3.8  --    GFR: Estimated Creatinine Clearance: 70.9 mL/min (by C-G formula based on SCr of 1.17 mg/dL). Liver Function Tests: Recent Labs  Lab 05/07/21 0900  AST 48*  ALT 35  ALKPHOS 52  BILITOT 0.6  PROT 6.5  ALBUMIN 3.5   No results for input(s): LIPASE, AMYLASE in the last 168 hours. No results for input(s): AMMONIA in the last 168 hours. Coagulation Profile: Recent Labs  Lab 05/07/21 0900  INR 1.1   Cardiac Enzymes: No results for input(s): CKTOTAL, CKMB, CKMBINDEX, TROPONINI in the last 168 hours. BNP (last 3 results) No results for input(s): PROBNP in the last 8760 hours. HbA1C: No results for input(s): HGBA1C in the last 72 hours. CBG: No results for input(s): GLUCAP in the last 168 hours. Lipid Profile: No results for input(s): CHOL, HDL, LDLCALC, TRIG, CHOLHDL, LDLDIRECT in the last 72 hours. Thyroid Function Tests: No results for input(s): TSH, T4TOTAL, FREET4, T3FREE, THYROIDAB in the last 72 hours. Anemia Panel: No results for input(s): VITAMINB12, FOLATE, FERRITIN, TIBC, IRON, RETICCTPCT in the last 72 hours. Sepsis Labs: No results for input(s): PROCALCITON, LATICACIDVEN in the last 168 hours.  Recent Results (from the past 240 hour(s))  Resp Panel by RT-PCR (Flu A&B, Covid) Nasopharyngeal Swab     Status: None   Collection Time: 05/06/21  1:00 AM   Specimen: Nasopharyngeal Swab; Nasopharyngeal(NP) swabs in vial transport medium  Result Value Ref Range Status   SARS Coronavirus 2 by RT PCR NEGATIVE NEGATIVE Final    Comment: (NOTE) SARS-CoV-2  target nucleic acids are NOT DETECTED.  The SARS-CoV-2 RNA is generally detectable in upper respiratory specimens during the acute phase of infection. The lowest concentration of SARS-CoV-2 viral copies this assay can detect is 138 copies/mL. A negative result does not preclude SARS-Cov-2 infection and should not be used as the sole basis for treatment or other patient management decisions. A negative result may occur with  improper specimen collection/handling, submission of specimen other than nasopharyngeal swab, presence of viral mutation(s) within the areas targeted by this assay, and inadequate number of viral copies(<138 copies/mL). A negative result must be combined with clinical observations, patient history, and epidemiological information. The expected result is Negative.  Fact Sheet for Patients:  05/08/21  Fact Sheet for Healthcare Providers:  BloggerCourse.com  This test is no t yet approved or cleared by the [place] FDA and  has been authorized for detection and/or diagnosis of SARS-CoV-2 by FDA under an Emergency Use Authorization (EUA). This EUA will remain  in effect (meaning this test can be used) for the duration of the COVID-19 declaration under Section 564(b)(1) of the Act, 21 U.S.C.section 360bbb-3(b)(1), unless the  authorization is terminated  or revoked sooner.       Influenza A by PCR NEGATIVE NEGATIVE Final   Influenza B by PCR NEGATIVE NEGATIVE Final    Comment: (NOTE) The Xpert Xpress SARS-CoV-2/FLU/RSV plus assay is intended as an aid in the diagnosis of influenza from Nasopharyngeal swab specimens and should not be used as a sole basis for treatment. Nasal washings and aspirates are unacceptable for Xpert Xpress SARS-CoV-2/FLU/RSV testing.  Fact Sheet for Patients: BloggerCourse.com  Fact Sheet for Healthcare  Providers: SeriousBroker.it  This test is not yet approved or cleared by the [place] FDA and has been authorized for detection and/or diagnosis of SARS-CoV-2 by FDA under an Emergency Use Authorization (EUA). This EUA will remain in effect (meaning this test can be used) for the duration of the COVID-19 declaration under Section 564(b)(1) of the Act, 21 U.S.C. section 360bbb-3(b)(1), unless the authorization is terminated or revoked.  Performed at [facility], [address] [place], [place] [address]   MRSA Next Gen by PCR, Nasal     Status: None   Collection Time: 05/08/21  3:53 AM   Specimen: Nasal Mucosa; Nasal Swab  Result Value Ref Range Status   MRSA by PCR Next Gen NOT DETECTED NOT DETECTED Final    Comment: (NOTE) The GeneXpert MRSA Assay (FDA approved for NASAL specimens only), is one component of a comprehensive MRSA colonization surveillance program. It is not intended to diagnose MRSA infection nor to guide or monitor treatment for MRSA infections. Test performance is not FDA approved in patients less than 75 years old. Performed at [facility], [address] [place], [place] [address]      Radiology Studies: No results found.   Scheduled Meds:  buPROPion  150 mg Oral Daily   Chlorhexidine Gluconate Cloth  6 each Topical Daily   finasteride  5 mg Oral Daily   latanoprost  1 drop Both Eyes QHS   losartan  50 mg Oral Daily   oxybutynin  5 mg Oral Daily   pantoprazole  40 mg Oral Daily   rosuvastatin  5 mg Oral QHS   tamsulosin  0.4 mg Oral Daily   trifluoperazine  5 mg Oral QHS   Continuous Infusions:     LOS: 2 days    Time spent: 25 mins    Cipriano Bunker, MD [facility]   If 7PM-7AM, please contact night-coverage

## 2021-05-09 NOTE — Progress Notes (Signed)
1 Day Post-Op   Subjective/Chief Complaint: C/o incisional pain   Objective: Vital signs in last 24 hours: Temp:  [97.5 F (36.4 C)-98.9 F (37.2 C)] 97.8 F (36.6 C) (07/24 0515) Pulse Rate:  [64-98] 98 (07/24 0515) Resp:  [15-28] 18 (07/24 0515) BP: (127-152)/(67-85) 127/70 (07/24 0515) SpO2:  [100 %] 100 % (07/24 0515) Last BM Date: 05/07/22  Intake/Output from previous day: 07/23 0701 - 07/24 0700 In: 1731.4 [P.O.:60; I.V.:1200; IV Piggyback:471.4] Out: 1450 [Urine:1400; Blood:50] Intake/Output this shift: No intake/output data recorded.  Exam: Awake, follows commands Appears comfortable Abdomen soft, incision clean, ostomy viable  Lab Results:  Recent Labs    05/07/21 0900 05/08/21 0515  WBC 15.1* 11.2*  HGB 12.9* 12.3*  HCT 40.7 38.9*  PLT 184 190   BMET Recent Labs    05/08/21 0515 05/09/21 0625  NA 139 137  K 3.1* 3.5  CL 102 103  CO2 26 26  GLUCOSE 83 93  BUN 30* 29*  CREATININE 1.09 1.17  CALCIUM 8.4* 8.2*   PT/INR Recent Labs    05/07/21 0900  LABPROT 14.3  INR 1.1   ABG No results for input(s): PHART, HCO3 in the last 72 hours.  Invalid input(s): PCO2, PO2  Studies/Results: DG Abd 2 Views  Result Date: 05/07/2021 CLINICAL DATA:  Encounter for polyp ileus of the sigmoid colon. EXAM: ABDOMEN - 2 VIEW COMPARISON:  One-view abdomen and CT abdomen pelvis 05/05/2021 FINDINGS: Dilated colon is partially decompressed compared to the prior exam. Decreased stool burden noted. Multiple fluid levels again noted within both large and small bowel. IMPRESSION: 1. Partial decompression of colon with decreasing stool burden. 2. Fluid levels consistent with persistent obstruction/ileus Electronically Signed   By: Marin Roberts M.D.   On: 05/07/2021 14:00    Anti-infectives: Anti-infectives (From admission, onward)    Start     Dose/Rate Route Frequency Ordered Stop   05/09/21 0600  cefoTEtan (CEFOTAN) 2 g in sodium chloride 0.9 % 100 mL IVPB   Status:  Discontinued        2 g 200 mL/hr over 30 Minutes Intravenous On call to O.R. 05/08/21 1423 05/08/21 1427   05/08/21 0815  cefOXitin (MEFOXIN) 2 g in sodium chloride 0.9 % 100 mL IVPB        2 g 200 mL/hr over 30 Minutes Intravenous  Once 05/08/21 0724 05/08/21 1143       Assessment/Plan: s/p Procedure(s): EXPLORATORY LAPAROTOMY, SIGMOID COLECTOMY, COLOSTOMY (N/A)  Will try clear liquids today   LOS: 2 days    Abigail Miyamoto 05/09/2021

## 2021-05-09 NOTE — Progress Notes (Signed)
Initial Nutrition Assessment  INTERVENTION:   -Boost Breeze po TID, each supplement provides 250 kcal and 9 grams of protein  -Multivitamin with minerals daily  NUTRITION DIAGNOSIS:   Increased nutrient needs related to post-op healing as evidenced by estimated needs.  GOAL:   Patient will meet greater than or equal to 90% of their needs   MONITOR:   PO intake, Supplement acceptance, Diet advancement, Weight trends, I & O's, Labs  REASON FOR ASSESSMENT:   Malnutrition Screening Tool    ASSESSMENT:   75 years old male with PMH significant for hypertension, bilateral lower extremity venous insufficiency, chronic kidney disease stage II and schizophrenia presented in the ED with intermittent abdominal pain for the last 3 days associated with vomiting.  CT abdomen and pelvis showed sigmoid volvulus,  moderate distention of the affected sigmoid colon,  moderate mesenteric edema involving sigmoid mesentery and mild ascites,  no free intraperitoneal gas or pneumatosis.  7/20: admitted 7/21: colonoscopy 7/22: s/p flex sig 7/23: s/p ex lap, sigmoid colectomy, colostomy  Patient on clear liquids today. Has been NPO/CLD since admission on 7/21. Pt was having N/V yesterday. Began having symptoms a week ago. Consumed 100% of clear liquid tray this morning. Will add Boost Breeze for additional kcals and protein to aid in post-op healing.  Per weight records, weights trending up since January 2022.  Medications reviewed.  Labs reviewed.  NUTRITION - FOCUSED PHYSICAL EXAM:  Unable to complete  Diet Order:   Diet Order             Diet clear liquid Room service appropriate? Yes; Fluid consistency: Thin  Diet effective now                   EDUCATION NEEDS:   No education needs have been identified at this time  Skin:  Skin Assessment: Skin Integrity Issues: Skin Integrity Issues:: Incisions Incisions: 7/23 abdomen  Last BM:  7/22 -type 6  Height:   Ht Readings  from Last 1 Encounters:  05/07/21 6\' 2"  (1.88 m)    Weight:   Wt Readings from Last 1 Encounters:  05/07/21 99.8 kg    BMI:  Body mass index is 28.25 kg/m.  Estimated Nutritional Needs:   Kcal:  2200-2400  Protein:  100-115g  Fluid:  2.2L/day  05/09/21, MS, RD, LDN Inpatient Clinical Dietitian Contact information available via Amion

## 2021-05-09 NOTE — Progress Notes (Signed)
This nurse noticed blood draining from abdominal surgical site today. Bandage was coming off. Messaged the surgeon who sent Brandon [name] up to see him.  Brandon removed the honeycomb dressing and  covered with 4x4 and noted site to look good. I was instructed to finish covering and apply abdominal binder. This nurse is noting that 3 staples are coming out and applied 4 steri strips per her charge and applied an ABD pad and taped down securely. This dressing is currently clean dry and intact.

## 2021-05-10 DIAGNOSIS — K562 Volvulus: Secondary | ICD-10-CM | POA: Diagnosis not present

## 2021-05-10 LAB — HEMOGLOBIN A1C
Hgb A1c MFr Bld: 5.9 % — ABNORMAL HIGH (ref 4.8–5.6)
Mean Plasma Glucose: 123 mg/dL

## 2021-05-10 MED ORDER — METHOCARBAMOL 500 MG PO TABS: 500.0000 mg | ORAL_TABLET | Freq: Four times a day (QID) | ORAL | Status: DC | PRN

## 2021-05-10 MED ORDER — OXYCODONE HCL 5 MG PO TABS
5.0000 mg | ORAL_TABLET | ORAL | Status: DC | PRN
  Administered 2021-05-10: 5 mg via ORAL
  Filled 2021-05-10: qty 1

## 2021-05-10 NOTE — Progress Notes (Signed)
PROGRESS NOTE    Brandon ATIENZA  [name]:[id number] DOB: 1946/04/26 DOA: 05/05/2021 PCP: Karl Ito, DO    Brief Narrative:  This 75 years old male with PMH significant for hypertension, bilateral lower extremity venous insufficiency, chronic kidney disease stage II and schizophrenia presented in the ED with intermittent abdominal pain for the last 3 days associated with vomiting.  CT abdomen and pelvis showed sigmoid volvulus,  moderate distention of the affected sigmoid colon,  moderate mesenteric edema involving sigmoid mesentery and mild ascites,  no free intraperitoneal gas or pneumatosis.  GI and general surgery consulted.  Patient underwent decompression. reduction of sigmoid volvulus.  Plan is to have colonoscopy followed by colectomy.  Assessment & Plan:   Principal Problem:   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(HCC) Active Problems:   HTN (hypertension)   Schizophrenia (HCC)  Acute sigmoid volvulus.  Patient presented with abdominal pain, distention with vomiting.   Patient s/p decompression / reduction of sigmoid volvulus. At this point patient denies any nausea and vomiting. High risk for recurrence, surgery has been consulted with plans for partial colectomy. Patient is medically stable for surgical procedure. Patient underwent colonoscopy but was suboptimal preparation.  Rectal tube was placed for decompression. Patient underwent exploratory laparotomy, sigmoid colectomy and colostomy placement  on 7/23 by general surgery.   Postoperative day 2.  Tolerated well. Adequate pain control with Dilaudid as needed. Patient tolerated clear liquid diet without nausea and vomiting. Care consult.  Voiding trial and mobilize PT and OT eval.  Hypertension :  Blood pressure on the lower side. Continue to monitor blood pressure Hold losartan.  Hold furosemide for now.  Hyperlipidemia:  Continue with statin therapy.   Schizophrenia.  Continue  bupropion, trifluperazine.   BPH:  Continue with  tamsulosin and oxybutinin. no signs of urinary retention.     DVT prophylaxis: Lovenox Code Status: Full code Family Communication: No family at bedside  Disposition Plan:   Consultants:  General surgery, GI  Procedures: Colonoscopy Antimicrobials: None  Subjective: Patient was seen and examined at bedside.  Overnight events noted. Patient s/p laparotomy with sigmoidectomy and colostomy placement , postoperative day 2. Patient has tolerated clear liquid diet,  reports pain at the incision site.   Objective: Vitals:   05/10/21 0530 05/10/21 1310 05/10/21 1314 05/10/21 1420  BP: (!) 105/56 (!) 89/40 (!) 76/40 (!) 104/53  Pulse: (!) 105 96  94  Resp: 18 20  20   Temp: (!) 97.4 F (36.3 C) 98.4 F (36.9 C)  100.2 F (37.9 C)  TempSrc: Oral Oral  Oral  SpO2: 100% 100%  100%  Weight:      Height:        Intake/Output Summary (Last 24 hours) at 05/10/2021 1505 Last data filed at 05/10/2021 1424 Gross per 24 hour  Intake 1180 ml  Output 850 ml  Net 330 ml   Filed Weights   05/05/21 2240 05/07/21 1422  Weight: 99.8 kg 99.8 kg    Examination:  General exam:  Appears comfortable,  deconditioned Respiratory system: Clear to auscultation. Respiratory effort normal. Cardiovascular system: S1 & S2 heard, RRR. No JVD, murmurs, rubs, gallops or clicks. No pedal edema. Gastrointestinal system: Abdomen is distended, soft and mildly tender, dressing noted. Colostomy bag on left side. Central nervous system: Alert and oriented x 2. No focal neurological deficits. Extremities: No edema, no cyanosis, no clubbing. Skin: No rashes, lesions or ulcers Psychiatry: Judgement and insight appear normal. Mood & affect appropriate.     Data Reviewed: I  have personally reviewed following labs and imaging studies  CBC: Recent Labs  Lab 05/05/21 2300 05/07/21 0900 05/08/21 0515  WBC 10.6* 15.1* 11.2*  NEUTROABS 9.3*  --   --   HGB 11.6* 12.9* 12.3*  HCT 36.8* 40.7 38.9*  MCV 85.0  87.2 86.4  PLT 170 184 190   Basic Metabolic Panel: Recent Labs  Lab 05/05/21 2300 05/07/21 0900 05/08/21 0515 05/09/21 0625  NA 138 141 139 137  K 3.4* 3.3* 3.1* 3.5  CL 100 102 102 103  CO2 25 29 26 26   GLUCOSE 127* 92 83 93  BUN 27* 28* 30* 29*  CREATININE 1.16 1.22 1.09 1.17  CALCIUM 9.7 8.9 8.4* 8.2*  MG  --  2.1 2.1  --   PHOS  --  2.7 3.8  --    GFR: Estimated Creatinine Clearance: 70.9 mL/min (by C-G formula based on SCr of 1.17 mg/dL). Liver Function Tests: Recent Labs  Lab 05/07/21 0900  AST 48*  ALT 35  ALKPHOS 52  BILITOT 0.6  PROT 6.5  ALBUMIN 3.5   No results for input(s): LIPASE, AMYLASE in the last 168 hours. No results for input(s): AMMONIA in the last 168 hours. Coagulation Profile: Recent Labs  Lab 05/07/21 0900  INR 1.1   Cardiac Enzymes: No results for input(s): CKTOTAL, CKMB, CKMBINDEX, TROPONINI in the last 168 hours. BNP (last 3 results) No results for input(s): PROBNP in the last 8760 hours. HbA1C: Recent Labs    05/08/21 1424  HGBA1C 5.9*   CBG: No results for input(s): GLUCAP in the last 168 hours. Lipid Profile: No results for input(s): CHOL, HDL, LDLCALC, TRIG, CHOLHDL, LDLDIRECT in the last 72 hours. Thyroid Function Tests: No results for input(s): TSH, T4TOTAL, FREET4, T3FREE, THYROIDAB in the last 72 hours. Anemia Panel: No results for input(s): VITAMINB12, FOLATE, FERRITIN, TIBC, IRON, RETICCTPCT in the last 72 hours. Sepsis Labs: No results for input(s): PROCALCITON, LATICACIDVEN in the last 168 hours.  Recent Results (from the past 240 hour(s))  Resp Panel by RT-PCR (Flu A&B, Covid) Nasopharyngeal Swab     Status: None   Collection Time: 05/06/21  1:00 AM   Specimen: Nasopharyngeal Swab; Nasopharyngeal(NP) swabs in vial transport medium  Result Value Ref Range Status   SARS Coronavirus 2 by RT PCR NEGATIVE NEGATIVE Final    Comment: (NOTE) SARS-CoV-2 target nucleic acids are NOT DETECTED.  The SARS-CoV-2 RNA is  generally detectable in upper respiratory specimens during the acute phase of infection. The lowest concentration of SARS-CoV-2 viral copies this assay can detect is 138 copies/mL. A negative result does not preclude SARS-Cov-2 infection and should not be used as the sole basis for treatment or other patient management decisions. A negative result may occur with  improper specimen collection/handling, submission of specimen other than nasopharyngeal swab, presence of viral mutation(s) within the areas targeted by this assay, and inadequate number of viral copies(<138 copies/mL). A negative result must be combined with clinical observations, patient history, and epidemiological information. The expected result is Negative.  Fact Sheet for Patients:  05/08/21  Fact Sheet for Healthcare Providers:  BloggerCourse.com  This test is no t yet approved or cleared by the [employer] FDA and  has been authorized for detection and/or diagnosis of SARS-CoV-2 by FDA under an Emergency Use Authorization (EUA). This EUA will remain  in effect (meaning this test can be used) for the duration of the COVID-19 declaration under Section 564(b)(1) of the Act, 21 U.S.C.section 360bbb-3(b)(1), unless the authorization  is terminated  or revoked sooner.       Influenza A by PCR NEGATIVE NEGATIVE Final   Influenza B by PCR NEGATIVE NEGATIVE Final    Comment: (NOTE) The Xpert Xpress SARS-CoV-2/FLU/RSV plus assay is intended as an aid in the diagnosis of influenza from Nasopharyngeal swab specimens and should not be used as a sole basis for treatment. Nasal washings and aspirates are unacceptable for Xpert Xpress SARS-CoV-2/FLU/RSV testing.  Fact Sheet for Patients: BloggerCourse.com  Fact Sheet for Healthcare Providers: SeriousBroker.it  This test is not yet approved or cleared by the [place] FDA and has been authorized for detection and/or diagnosis of SARS-CoV-2 by FDA under an Emergency Use Authorization (EUA). This EUA will remain in effect (meaning this test can be used) for the duration of the COVID-19 declaration under Section 564(b)(1) of the Act, 21 U.S.C. section 360bbb-3(b)(1), unless the authorization is terminated or revoked.  Performed at [facility], [address] [place], [place] [address]   MRSA Next Gen by PCR, Nasal     Status: None   Collection Time: 05/08/21  3:53 AM   Specimen: Nasal Mucosa; Nasal Swab  Result Value Ref Range Status   MRSA by PCR Next Gen NOT DETECTED NOT DETECTED Final    Comment: (NOTE) The GeneXpert MRSA Assay (FDA approved for NASAL specimens only), is one component of a comprehensive MRSA colonization surveillance program. It is not intended to diagnose MRSA infection nor to guide or monitor treatment for MRSA infections. Test performance is not FDA approved in patients less than 62 years old. Performed at [facility], [address] [place], [place] [address]      Radiology Studies: No results found.   Scheduled Meds:  buPROPion  150 mg Oral Daily   Chlorhexidine Gluconate Cloth  6 each Topical Daily   feeding supplement  1 Container Oral TID BM   finasteride  5 mg Oral Daily   latanoprost  1 drop Both Eyes QHS   losartan  50 mg Oral Daily   multivitamin with minerals  1 tablet Oral Daily   oxybutynin  5 mg Oral Daily   pantoprazole  40 mg Oral Daily   rosuvastatin  5 mg Oral QHS   tamsulosin  0.4 mg Oral Daily   trifluoperazine  5 mg Oral QHS   Continuous Infusions:     LOS: 3 days    Time spent: 25 mins    Cipriano Bunker, MD [facility]   If 7PM-7AM, please contact night-coverage

## 2021-05-10 NOTE — Consult Note (Addendum)
WOC consult received, will see patient 05/11/21 for first post op change with CG present. Patient is confused and will need assistance with care.  Natlie Asfour [facility], CNS, [employer] [phone]

## 2021-05-10 NOTE — Progress Notes (Signed)
2 Days Post-Op  Subjective: CC: Pain improved. Reports pain only at the incision. Tolerating cld without n/v. No ostomy output. Foley still in place. Unclear if mobilized yesterday. Nurse reports some drainage from inferior aspect of incision.   Objective: Vital signs in last 24 hours: Temp:  [97.4 F (36.3 C)-97.8 F (36.6 C)] 97.4 F (36.3 C) (07/25 0530) Pulse Rate:  [98-105] 105 (07/25 0530) Resp:  [18] 18 (07/25 0530) BP: (105-106)/(56-78) 105/56 (07/25 0530) SpO2:  [100 %] 100 % (07/25 0530) Last BM Date: 05/07/22  Intake/Output from previous day: 07/24 0701 - 07/25 0700 In: 520 [P.O.:520] Out: 1000 [Urine:1000] Intake/Output this shift: Total I/O In: 480 [P.O.:480] Out: 300 [Urine:300]  PE: Gen:  NAD Card:  Reg Pulm:  Normal rate and effort  Abd: Soft, ND, NT except near midline incision which appears appropriate. +BS, ostomy pink and viable. No output in ostomy bag. Midline wound clean with staples in place. There is a small opening between a few staples at the base of the wound with SS drainage noted. Faint bruising at the base of the wound extending left without signs of obvious cellulitis.  Ext:  No LE edema or calf tenderness Psych: A&Ox3  Skin: no rashes noted, warm and dry  Lab Results:  Recent Labs    05/08/21 0515  WBC 11.2*  HGB 12.3*  HCT 38.9*  PLT 190   BMET Recent Labs    05/08/21 0515 05/09/21 0625  NA 139 137  K 3.1* 3.5  CL 102 103  CO2 26 26  GLUCOSE 83 93  BUN 30* 29*  CREATININE 1.09 1.17  CALCIUM 8.4* 8.2*   PT/INR No results for input(s): LABPROT, INR in the last 72 hours. CMP     Component Value Date/Time   NA 137 05/09/2021 0625   K 3.5 05/09/2021 0625   CL 103 05/09/2021 0625   CO2 26 05/09/2021 0625   GLUCOSE 93 05/09/2021 0625   BUN 29 (H) 05/09/2021 0625   CREATININE 1.17 05/09/2021 0625   CALCIUM 8.2 (L) 05/09/2021 0625   PROT 6.5 05/07/2021 0900   ALBUMIN 3.5 05/07/2021 0900   AST 48 (H) 05/07/2021  0900   ALT 35 05/07/2021 0900   ALKPHOS 52 05/07/2021 0900   BILITOT 0.6 05/07/2021 0900   GFRNONAA >60 05/09/2021 0625   Lipase  No results found for: LIPASE  Studies/Results: No results found.  Anti-infectives: Anti-infectives (From admission, onward)    Start     Dose/Rate Route Frequency Ordered Stop   05/09/21 0600  cefoTEtan (CEFOTAN) 2 g in sodium chloride 0.9 % 100 mL IVPB  Status:  Discontinued        2 g 200 mL/hr over 30 Minutes Intravenous On call to O.R. 05/08/21 1423 05/08/21 1427   05/08/21 0815  cefOXitin (MEFOXIN) 2 g in sodium chloride 0.9 % 100 mL IVPB        2 g 200 mL/hr over 30 Minutes Intravenous  Once 05/08/21 0724 05/08/21 1143        Assessment/Plan POD 2 s/p exploratory laparotomy, sigmoid colectomy, colostomy for volvulus of sigmoid colon - Dr. Carolynne Edouard - 05/08/21 - AROBF, cont CLD - Add oral pain medication - WOCN consult - D/C Foley  - Mobilize, PT consult - Monitor wound, has some SS drainage and hematoma but no current signs of infection - Pulm toilet - Path pending   FEN - CLD, IVF per TRH VTE - SCDs, okay for chemical prophylaxis from a general  surgery standpoint ID - Rocephin/cefotetan periop Foley - d/c, TOV  ABL anemia - hgb stable HTN HLD Schizophrenia  BPH   LOS: 3 days    Jacinto Halim , [facility] Surgery 05/10/2021, 12:13 PM Please see Amion for pager number during day hours 7:00am-4:30pm

## 2021-05-10 NOTE — Care Management Important Message (Signed)
Important Message  Patient Details IM Letter given to the Patient. Name: BRANDT CHANEY MRN: [id number] Date of Birth: 06-Mar-1946   Medicare Important Message Given:  Yes     Caren Macadam 05/10/2021, 11:16 AM

## 2021-05-11 ENCOUNTER — Encounter (HOSPITAL_COMMUNITY): Payer: Self-pay | Admitting: Gastroenterology

## 2021-05-11 DIAGNOSIS — K562 Volvulus: Secondary | ICD-10-CM | POA: Diagnosis not present

## 2021-05-11 LAB — CBC
HCT: 35.4 % — ABNORMAL LOW (ref 39.0–52.0)
Hemoglobin: 11 g/dL — ABNORMAL LOW (ref 13.0–17.0)
MCH: 27.4 pg (ref 26.0–34.0)
MCHC: 31.1 g/dL (ref 30.0–36.0)
MCV: 88.3 fL (ref 80.0–100.0)
Platelets: 147 10*3/uL — ABNORMAL LOW (ref 150–400)
RBC: 4.01 MIL/uL — ABNORMAL LOW (ref 4.22–5.81)
RDW: 16.7 % — ABNORMAL HIGH (ref 11.5–15.5)
WBC: 11.7 10*3/uL — ABNORMAL HIGH (ref 4.0–10.5)
nRBC: 0 % (ref 0.0–0.2)

## 2021-05-11 LAB — SURGICAL PATHOLOGY

## 2021-05-11 MED ORDER — ENOXAPARIN SODIUM 40 MG/0.4ML IJ SOSY
40.0000 mg | PREFILLED_SYRINGE | Freq: Every day | INTRAMUSCULAR | Status: DC
  Administered 2021-05-11 – 2021-05-13 (×3): 40 mg via SUBCUTANEOUS
  Filled 2021-05-11 (×3): qty 0.4

## 2021-05-11 NOTE — TOC Initial Note (Signed)
Transition of Care [facility]) - Initial/Assessment Note    Patient Details  Name: Brandon [name] MRN: [id number] Date of Birth: Mar 12, 1946  Transition of Care [facility]) CM/SW Contact:    Ida Rogue, LCSW Phone Number: 05/11/2021, 2:59 PM  Clinical Narrative:   Patient seen in follow up to PT recommendation of SNF.  Spoke with Mr Frazier Richards who reports he is open to considering this as he knows he feels weak and also feels a little overwhelmed with the "multi-step process" of changing his pouch.  He asked that I call sister.  Left message.  In meantime, have worked him up for SNF, initiated bed search. TOC will continue to follow during the course of hospitalization.                 Expected Discharge Plan: Skilled Nursing Facility Barriers to Discharge: SNF Pending bed offer   Patient Goals and CMS Choice     Choice offered to / list presented to : Patient, Sibling  Expected Discharge Plan and Services Expected Discharge Plan: Skilled Nursing Facility   Discharge Planning Services: CM Consult Post Acute Care Choice: Skilled Nursing Facility Living arrangements for the past 2 months: Single Family Home                                      Prior Living Arrangements/Services Living arrangements for the past 2 months: Single Family Home Lives with:: Siblings Patient language and need for interpreter reviewed:: Yes        Need for Family Participation in Patient Care: Yes (Comment) Care giver support system in place?: Yes (comment)   Criminal Activity/Legal Involvement Pertinent to Current Situation/Hospitalization: No - Comment as needed  Activities of Daily Living Home Assistive Devices/Equipment: Cane (specify quad or straight), Walker (specify type) ADL Screening (condition at time of admission) Patient's cognitive ability adequate to safely complete daily activities?: No Is the patient deaf or have difficulty hearing?: No Does the patient have difficulty seeing, even  when wearing glasses/contacts?: Yes Does the patient have difficulty concentrating, remembering, or making decisions?: Yes Patient able to express need for assistance with ADLs?: No Does the patient have difficulty dressing or bathing?: Yes Independently performs ADLs?: No Communication: Needs assistance Is this a change from baseline?: Pre-admission baseline Dressing (OT): Needs assistance Is this a change from baseline?: Pre-admission baseline Grooming: Needs assistance Is this a change from baseline?: Pre-admission baseline Feeding: Needs assistance Is this a change from baseline?: Pre-admission baseline Bathing: Needs assistance Is this a change from baseline?: Pre-admission baseline Toileting: Needs assistance Is this a change from baseline?: Pre-admission baseline In/Out Bed: Needs assistance Is this a change from baseline?: Pre-admission baseline Walks in Home: Independent with device (comment) (Uses a walkern or a cane) Does the patient have difficulty walking or climbing stairs?: Yes Weakness of Legs: Both Weakness of Arms/Hands: None  Permission Sought/Granted Permission sought to share information with : Family Supports Permission granted to share information with : Yes, Verbal Permission Granted  Share Information with NAME: Labib, Cwynar (Sister)   [phone], [phone]           Emotional Assessment Appearance:: Appears stated age Attitude/Demeanor/Rapport: Engaged Affect (typically observed): Appropriate Orientation: : Oriented to Self, Oriented to Situation, Oriented to Place Alcohol / Substance Use: Not Applicable Psych Involvement: Yes (comment) (Outpatient medication management)  Admission diagnosis:  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) [K56.2]  Patient Active Problem List   Diagnosis Date Noted   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(HCC) 05/06/2021   HTN (hypertension) 05/06/2021   Schizophrenia (HCC) 05/06/2021   Osteoarthritis of left  knee 07/08/2020   Pain due to onychomycosis of toenails of both feet 10/09/2019   Cough 03/28/2019   Laryngopharyngeal reflux (LPR) 03/28/2019   Presbycusis of both ears 03/28/2019   Chronic venous insufficiency 05/10/2017   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 [address] [place] [address] Phone: [phone] Fax: [phone]     Social Determinants of Health (SDOH) Interventions    Readmission Risk Interventions No flowsheet data found.

## 2021-05-11 NOTE — Consult Note (Signed)
WOC Nurse ostomy consult note Stoma type/location: LLQ, colostomy Stomal assessment/size: 1 1/4" x 2" oval shaped, pink, flush, os at 3 o'clock  Peristomal assessment: intact  Treatment options for stomal/peristomal skin: 2" skin barrier ring Output bloody Ostomy pouching: 2pc. 2 3/4" with 2" skin barrier ring Education provided:  Discussed patient with her sister; she wishes for patient to learn to "take care of his own body". She will assist when she can but when I requested she come in for teaching she rambled lots of questions and flighty with questions while appropriate would be better to teach her with hands on in an organized fashion. She again explains to [facility] nurse that she does not want to be the one who cares for his ostomy.   When I arrived back to the patient's room with teaching materials and pouching supplied to change pouch; which I just explained my role to the patient he is questioning the ability to "wash my feet and get some XL socks"  He is very lethargic during conversation; dozing off.  He does not have very good recall but ask appropriate questions.   Explained role of ostomy nurse and creation of stoma  Explained stoma characteristics (budded, flush, color, texture, care)  Demonstrated pouch change (cutting new skin barrier, measuring stoma, cleaning peristomal skin and stoma, use of barrier ring)  Allowed patient to open and close lock and roll closure; explained cleaning spout to the patient.   Conversation drifts frequently from teaching   Cognition and recall will limit patient's ability to care for ostomy independently.  If sister wishes for patient to return to home she would definitely need to come in for hands on teaching to support patient at home.  Goal for now will be to see if patient can empty pouch.  WOC Nurse will follow along with you for continued support with ostomy teaching and care [facility] MSN, RN, [place], CNS, [place] [phone]          Enrolled patient in [facility] DC program: Yes/No

## 2021-05-11 NOTE — Progress Notes (Addendum)
PROGRESS NOTE    Brandon [name]  WER:[id number] DOB: Aug 15, 1946 DOA: 05/05/2021 PCP: Karl Ito, DO    Brief Narrative:  This 75 years old male with PMH significant for hypertension, bilateral lower extremity venous insufficiency, chronic kidney disease stage II and schizophrenia presented in the ED with intermittent abdominal pain for the last 3 days associated with vomiting.  CT abdomen and pelvis showed sigmoid volvulus,  moderate distention of the affected sigmoid colon,  moderate mesenteric edema involving sigmoid mesentery and mild ascites,  no free intraperitoneal gas or pneumatosis.  GI and general surgery consulted.  Patient underwent decompression /reduction of sigmoid volvulus but failed.  Patient underwent exploratory laparotomy sigmoid colectomy and colostomy placement.  Postoperative without complications.  Patient is tolerating full liquid diet.     Assessment & Plan:   Principal Problem:   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(HCC) Active Problems:   HTN (hypertension)   Schizophrenia (HCC)  Acute sigmoid volvulus.  Patient presented with abdominal pain, distention  and vomiting.   Patient s/p decompression / reduction of sigmoid volvulus but failed. High risk for recurrence, surgery was consulted with plans for partial colectomy. Patient underwent colonoscopy but was suboptimal preparation.  Rectal tube was placed for decompression. Patient underwent exploratory laparotomy, sigmoid colectomy and colostomy placement  on 7/23 by general surgery.  . Adequate pain control with Dilaudid as needed. Patient tolerated clear liquid diet without nausea and vomiting advanced to full liquid. Wound Care consult.  Voiding trial and mobilize PT and OT eval.  Hypertension :  Blood pressure on the lower side. Continue to monitor blood pressure Hold BP medications.  Hyperlipidemia:  Continue with statin therapy.   Schizophrenia.  Continue  bupropion, trifluperazine.   BPH:  Continue with  tamsulosin and oxybutinin. no signs of urinary retention.     DVT prophylaxis: Lovenox Code Status: Full code Family Communication: No family at bedside  Disposition Plan:   Consultants:  General surgery, GI  Procedures: Colonoscopy Antimicrobials: None  Subjective: Patient was seen and examined at bedside.  Overnight events noted. Patient s/p laparotomy with sigmoidectomy and colostomy placement , postoperative day 2. Patient has tolerated clear liquid diet,  reports pain at the incision site.   Objective: Vitals:   05/10/21 2133 05/11/21 0107 05/11/21 0509 05/11/21 1442  BP: (!) 105/57 99/63 106/65 125/66  Pulse: (!) 107 97 (!) 104 85  Resp: 18 18 18 14   Temp: 98.4 F (36.9 C) 98 F (36.7 C) 98.5 F (36.9 C) 98.1 F (36.7 C)  TempSrc: Oral Oral Oral Oral  SpO2: 100% 100% 100% 100%  Weight:      Height:        Intake/Output Summary (Last 24 hours) at 05/11/2021 1512 Last data filed at 05/11/2021 1020 Gross per 24 hour  Intake 685 ml  Output 125 ml  Net 560 ml   Filed Weights   05/05/21 2240 05/07/21 1422  Weight: 99.8 kg 99.8 kg    Examination:  General exam:  Appears comfortable,  deconditioned, not in any acute distress Respiratory system: Clear to auscultation. Respiratory effort normal. Cardiovascular system: S1-S2 heard, regular rate and rhythm, no murmur, no pedal edema Gastrointestinal system: Abdomen is distended, soft and mildly tender, dressing noted. Colostomy bag on left side. Central nervous system: Alert and oriented x 2. No focal neurological deficits. Extremities: No edema, no cyanosis, no clubbing. Skin: No rashes, lesions or ulcers Psychiatry: Judgement and insight appear normal. Mood & affect appropriate.     Data Reviewed: I have  personally reviewed following labs and imaging studies  CBC: Recent Labs  Lab 05/05/21 2300 05/07/21 0900 05/08/21 0515 05/11/21 0547  WBC 10.6* 15.1* 11.2* 11.7*  NEUTROABS 9.3*  --   --   --   HGB  11.6* 12.9* 12.3* 11.0*  HCT 36.8* 40.7 38.9* 35.4*  MCV 85.0 87.2 86.4 88.3  PLT 170 184 190 147*   Basic Metabolic Panel: Recent Labs  Lab 05/05/21 2300 05/07/21 0900 05/08/21 0515 05/09/21 0625  NA 138 141 139 137  K 3.4* 3.3* 3.1* 3.5  CL 100 102 102 103  CO2 25 29 26 26   GLUCOSE 127* 92 83 93  BUN 27* 28* 30* 29*  CREATININE 1.16 1.22 1.09 1.17  CALCIUM 9.7 8.9 8.4* 8.2*  MG  --  2.1 2.1  --   PHOS  --  2.7 3.8  --    GFR: Estimated Creatinine Clearance: 70.9 mL/min (by C-G formula based on SCr of 1.17 mg/dL). Liver Function Tests: Recent Labs  Lab 05/07/21 0900  AST 48*  ALT 35  ALKPHOS 52  BILITOT 0.6  PROT 6.5  ALBUMIN 3.5   No results for input(s): LIPASE, AMYLASE in the last 168 hours. No results for input(s): AMMONIA in the last 168 hours. Coagulation Profile: Recent Labs  Lab 05/07/21 0900  INR 1.1   Cardiac Enzymes: No results for input(s): CKTOTAL, CKMB, CKMBINDEX, TROPONINI in the last 168 hours. BNP (last 3 results) No results for input(s): PROBNP in the last 8760 hours. HbA1C: No results for input(s): HGBA1C in the last 72 hours.  CBG: No results for input(s): GLUCAP in the last 168 hours. Lipid Profile: No results for input(s): CHOL, HDL, LDLCALC, TRIG, CHOLHDL, LDLDIRECT in the last 72 hours. Thyroid Function Tests: No results for input(s): TSH, T4TOTAL, FREET4, T3FREE, THYROIDAB in the last 72 hours. Anemia Panel: No results for input(s): VITAMINB12, FOLATE, FERRITIN, TIBC, IRON, RETICCTPCT in the last 72 hours. Sepsis Labs: No results for input(s): PROCALCITON, LATICACIDVEN in the last 168 hours.  Recent Results (from the past 240 hour(s))  Resp Panel by RT-PCR (Flu A&B, Covid) Nasopharyngeal Swab     Status: None   Collection Time: 05/06/21  1:00 AM   Specimen: Nasopharyngeal Swab; Nasopharyngeal(NP) swabs in vial transport medium  Result Value Ref Range Status   SARS Coronavirus 2 by RT PCR NEGATIVE NEGATIVE Final    Comment:  (NOTE) SARS-CoV-2 target nucleic acids are NOT DETECTED.  The SARS-CoV-2 RNA is generally detectable in upper respiratory specimens during the acute phase of infection. The lowest concentration of SARS-CoV-2 viral copies this assay can detect is 138 copies/mL. A negative result does not preclude SARS-Cov-2 infection and should not be used as the sole basis for treatment or other patient management decisions. A negative result may occur with  improper specimen collection/handling, submission of specimen other than nasopharyngeal swab, presence of viral mutation(s) within the areas targeted by this assay, and inadequate number of viral copies(<138 copies/mL). A negative result must be combined with clinical observations, patient history, and epidemiological information. The expected result is Negative.  Fact Sheet for Patients:  05/08/21  Fact Sheet for Healthcare Providers:  BloggerCourse.com  This test is no t yet approved or cleared by the [place] FDA and  has been authorized for detection and/or diagnosis of SARS-CoV-2 by FDA under an Emergency Use Authorization (EUA). This EUA will remain  in effect (meaning this test can be used) for the duration of the COVID-19 declaration under Section 564(b)(1) of  the Act, 21 U.S.C.section 360bbb-3(b)(1), unless the authorization is terminated  or revoked sooner.       Influenza A by PCR NEGATIVE NEGATIVE Final   Influenza B by PCR NEGATIVE NEGATIVE Final    Comment: (NOTE) The Xpert Xpress SARS-CoV-2/FLU/RSV plus assay is intended as an aid in the diagnosis of influenza from Nasopharyngeal swab specimens and should not be used as a sole basis for treatment. Nasal washings and aspirates are unacceptable for Xpert Xpress SARS-CoV-2/FLU/RSV testing.  Fact Sheet for Patients: BloggerCourse.com  Fact Sheet for Healthcare  Providers: SeriousBroker.it  This test is not yet approved or cleared by the [place] FDA and has been authorized for detection and/or diagnosis of SARS-CoV-2 by FDA under an Emergency Use Authorization (EUA). This EUA will remain in effect (meaning this test can be used) for the duration of the COVID-19 declaration under Section 564(b)(1) of the Act, 21 U.S.C. section 360bbb-3(b)(1), unless the authorization is terminated or revoked.  Performed at [facility], [address], [place], [place] [address]   MRSA Next Gen by PCR, Nasal     Status: None   Collection Time: 05/08/21  3:53 AM   Specimen: Nasal Mucosa; Nasal Swab  Result Value Ref Range Status   MRSA by PCR Next Gen NOT DETECTED NOT DETECTED Final    Comment: (NOTE) The GeneXpert MRSA Assay (FDA approved for NASAL specimens only), is one component of a comprehensive MRSA colonization surveillance program. It is not intended to diagnose MRSA infection nor to guide or monitor treatment for MRSA infections. Test performance is not FDA approved in patients less than 70 years old. Performed at [facility], [address], [place], [place] [address]      Radiology Studies: No results found.   Scheduled Meds:  buPROPion  150 mg Oral Daily   Chlorhexidine Gluconate Cloth  6 each Topical Daily   enoxaparin (LOVENOX) injection  40 mg Subcutaneous Daily   feeding supplement  1 Container Oral TID BM   finasteride  5 mg Oral Daily   latanoprost  1 drop Both Eyes QHS   multivitamin with minerals  1 tablet Oral Daily   oxybutynin  5 mg Oral Daily   pantoprazole  40 mg Oral Daily   rosuvastatin  5 mg Oral QHS   tamsulosin  0.4 mg Oral Daily   trifluoperazine  5 mg Oral QHS   Continuous Infusions:     LOS: 4 days    Time spent: 25 mins    Cipriano Bunker, MD [facility]   If 7PM-7AM, please contact night-coverage

## 2021-05-11 NOTE — Progress Notes (Signed)
3 Days Post-Op  Subjective: CC: More alert today. Sitting up eating breakfast. Reports he is tolerating cld without n/v. Having some pain around his incision that is well controlled and improved from yesterday. No other areas of pain. Foley out. Voiding. Did not get out of bed yesterday. Soft bp yesterday/overnight that is improved this am.   Objective: Vital signs in last 24 hours: Temp:  [97.8 F (36.6 C)-100.2 F (37.9 C)] 98.5 F (36.9 C) (07/26 0509) Pulse Rate:  [94-108] 104 (07/26 0509) Resp:  [18-20] 18 (07/26 0509) BP: (76-106)/(40-65) 106/65 (07/26 0509) SpO2:  [100 %] 100 % (07/26 0509) Last BM Date: 05/07/22  Intake/Output from previous day: 07/25 0701 - 07/26 0700 In: 1320 [P.O.:1320] Out: 425 [Urine:425] Intake/Output this shift: No intake/output data recorded.  PE: Gen:  NAD Card:  Reg Pulm:  Normal rate and effort  Abd: Soft, ND, NT except near midline incision which appears appropriate. +BS, ostomy pink and viable. Some sweat mixed and air in colostomy bag. Midline wound clean with staples in place. No drainage. Faint bruising at the base of the wound extending left without signs of obvious cellulitis.  Ext:  No LE edema or calf tenderness Psych: A&Ox3 Skin: no rashes noted, warm and dry  Lab Results:  Recent Labs    05/11/21 0547  WBC 11.7*  HGB 11.0*  HCT 35.4*  PLT 147*   BMET Recent Labs    05/09/21 0625  NA 137  K 3.5  CL 103  CO2 26  GLUCOSE 93  BUN 29*  CREATININE 1.17  CALCIUM 8.2*   PT/INR No results for input(s): LABPROT, INR in the last 72 hours. CMP     Component Value Date/Time   NA 137 05/09/2021 0625   K 3.5 05/09/2021 0625   CL 103 05/09/2021 0625   CO2 26 05/09/2021 0625   GLUCOSE 93 05/09/2021 0625   BUN 29 (H) 05/09/2021 0625   CREATININE 1.17 05/09/2021 0625   CALCIUM 8.2 (L) 05/09/2021 0625   PROT 6.5 05/07/2021 0900   ALBUMIN 3.5 05/07/2021 0900   AST 48 (H) 05/07/2021 0900   ALT 35 05/07/2021 0900    ALKPHOS 52 05/07/2021 0900   BILITOT 0.6 05/07/2021 0900   GFRNONAA >60 05/09/2021 0625   Lipase  No results found for: LIPASE  Studies/Results: No results found.  Anti-infectives: Anti-infectives (From admission, onward)    Start     Dose/Rate Route Frequency Ordered Stop   05/09/21 0600  cefoTEtan (CEFOTAN) 2 g in sodium chloride 0.9 % 100 mL IVPB  Status:  Discontinued        2 g 200 mL/hr over 30 Minutes Intravenous On call to O.R. 05/08/21 1423 05/08/21 1427   05/08/21 0815  cefOXitin (MEFOXIN) 2 g in sodium chloride 0.9 % 100 mL IVPB        2 g 200 mL/hr over 30 Minutes Intravenous  Once 05/08/21 0724 05/08/21 1143        Assessment/Plan POD 3 s/p exploratory laparotomy, sigmoid colectomy, colostomy for volvulus of sigmoid colon - Dr. Carolynne Edouard - 05/08/21 - Adv to FLD - WOCN following - Mobilize, PT consult - Pulm toilet - Path pending    FEN - FLD, IVF per [facility] VTE - SCDs, okay for chemical prophylaxis from a general surgery standpoint ID - Rocephin/cefotetan periop Foley - d/c'd 7/25   ABL anemia - hgb 11.0 from 12.3 HTN HLD Schizophrenia BPH   LOS: 4 days    Elmer Sow  Inaya Gillham , [facility] Surgery 05/11/2021, 8:49 AM Please see Amion for pager number during day hours 7:00am-4:30pm

## 2021-05-11 NOTE — [place] FL2 (Signed)
[place] MEDICAID FL2 LEVEL OF CARE SCREENING TOOL     IDENTIFICATION  Patient Name: Brandon [name] Birthdate: November 25, 1945 Sex: male Admission Date (Current Location): 05/05/2021  [facility] and [place] Number:  Producer, television/film/video and Address:  [facility],  [address] 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   HTN (hypertension) 05/06/2021   Schizophrenia (HCC) 05/06/2021   Osteoarthritis of left knee 07/08/2020   Pain due to onychomycosis of toenails of both feet 10/09/2019   Cough 03/28/2019   Laryngopharyngeal reflux (LPR) 03/28/2019   Presbycusis of both ears 03/28/2019   Chronic venous insufficiency 05/10/2017   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) 09/23/2015    Orientation RESPIRATION BLADDER Height & Weight     Self, Situation, Place  Normal External catheter Weight: 99.8 kg Height:  6\' 2"  (188 cm)  BEHAVIORAL SYMPTOMS/MOOD NEUROLOGICAL BOWEL NUTRITION STATUS   (none)  (none) Colostomy (new) Diet (see d/c summary)  AMBULATORY STATUS COMMUNICATION OF NEEDS Skin   Extensive Assist Verbally Surgical wounds (abdomen)                       Personal Care Assistance Level of Assistance  Bathing, Feeding, Dressing Bathing Assistance: Maximum assistance Feeding assistance: Independent Dressing Assistance: Limited assistance     Functional Limitations Info  Sight, Hearing, Speech Sight Info: Adequate Hearing Info: Adequate Speech Info: Adequate    SPECIAL CARE FACTORS  FREQUENCY  PT (By licensed PT)     PT Frequency: 5X/W              Contractures Contractures Info: Not present    Additional Factors Info  Code Status, Allergies Code Status Info: full Allergies Info: NKA           Current Medications (05/11/2021):  This is the current hospital active medication list Current Facility-Administered Medications  Medication Dose Route Frequency Provider Last Rate Last Admin   acetaminophen (TYLENOL) tablet 650 mg  650 mg Oral Q6H PRN 05/13/2021, MD       Or   acetaminophen (TYLENOL) suppository 650 mg  650 mg Rectal Q6H PRN Rachael Fee, MD       bu[facility] SR) 12 hr tablet 150 mg  150 mg Oral Daily Arrien, [facility], MD   150 mg at 05/11/21 1039   Chlorhexidine Gluconate Cloth 2 % PADS 6 each  6 each Topical Daily 05/13/21, MD   6 each at 05/11/21 1040   enoxaparin (LOVENOX) injection 40 mg  40 mg Subcutaneous Daily 05/13/21, MD   40 mg at 05/11/21 1051   feeding supplement (BOOST / RESOURCE BREEZE) liquid 1 Container  1 Container Oral TID BM 05/13/21, MD   1 Container at 05/11/21 1508   finasteride (PROSCAR) tablet 5 mg  5 mg Oral Daily Arrien, 05/13/21, MD   5 mg at 05/11/21 1040   fluticasone (FLONASE) 50 MCG/ACT nasal spray 1-2 spray  1-2 spray Each Nare Daily  PRN Arrien, York Ram, MD       HYDROmorphone (DILAUDID) injection 1 mg  1 mg Intravenous Q3H PRN Cipriano Bunker, MD   1 mg at 05/10/21 1053   latanoprost (XALATAN) 0.005 % ophthalmic solution 1 drop  1 drop Both Eyes QHS Coralie Keens, MD   1 drop at 05/10/21 2133   methocarbamol (ROBAXIN) tablet 500 mg  500 mg Oral Q6H PRN Jacinto Halim, PA-C       multivitamin with minerals tablet 1 tablet  1 tablet Oral Daily Cipriano Bunker, MD   1 tablet at 05/11/21 1039   ondansetron (ZOFRAN) tablet 4 mg  4 mg Oral Q6H PRN Rachael Fee, MD       Or   ondansetron [facility]) injection 4 mg  4 mg Intravenous Q6H PRN Rachael Fee, MD       oxybutynin (DITROPAN-XL) 24 hr tablet 5 mg  5 mg Oral Daily Arrien, York Ram, MD   5 mg at 05/11/21 1039   oxyCODONE (Oxy IR/ROXICODONE) immediate release tablet 5-10 mg  5-10 mg Oral Q4H PRN Jacinto Halim, PA-C   5 mg at 05/10/21 2148   pantoprazole (PROTONIX) EC tablet 40 mg  40 mg Oral Daily Arrien, York Ram, MD   40 mg at 05/11/21 1039   rosuvastatin (CRESTOR) tablet 5 mg  5 mg Oral QHS Arrien, York Ram, MD   5 mg at 05/10/21 2132   tamsulosin (FLOMAX) capsule 0.4 mg  0.4 mg Oral Daily Arrien, York Ram, MD   0.4 mg at 05/11/21 1039   trifluoperazine (STELAZINE) tablet 5 mg  5 mg Oral QHS Arrien, York Ram, MD   5 mg at 05/10/21 2132     Discharge Medications: Please see discharge summary for a list of discharge medications.  Relevant Imaging Results:  Relevant Lab Results:   Additional Information [phone] [place], [place]

## 2021-05-11 NOTE — Evaluation (Signed)
Physical Therapy Evaluation Patient Details Name: Brandon [name] MRN: [id number] DOB: 12-Sep-1946 Today's Date: 05/11/2021   History of Present Illness  Pt is a 75 years old male presented in the ED with intermittent abdominal pain associated with vomiting.  CT abdomen and pelvis showed sigmoid volvulus,  moderate distention of the affected sigmoid colon, moderate mesenteric edema involving sigmoid mesentery and mild ascites, no free intraperitoneal gas or pneumatosis. Pt s/p exploratory laparotomy, sigmoid colectomy, colostomy for volvulus of sigmoid colon 05/08/21. PMH HTN, bil LE venous insufficiency, CKD stage II and schizophrenia   Clinical Impression  Pt admitted with above diagnosis. Pt is POD3 s/p exlap, sigmoid colectomy and colostomy, currently requiring mod A with bed mobility, heavy verbal cues for log roll technique and max A to clear bottom into squatted standing position. Pt unsteady on feet, unable to perform standing marching or take sidesteps up to [facility] with single person assist. Assisted pt back to supine with bed assist to scoot up in bed and reposition to center. Pt reports ambulating around the home at baseline with SPC or RW, navigating 1-2 steps to get onto porch without difficulty and denies recent falls. Pt is below baseline and would benefit from short term strengthening at SNF prior to return home with sister to assist. Pt currently with functional limitations due to the deficits listed below (see PT Problem List). Pt will benefit from skilled PT to increase their independence and safety with mobility to allow discharge to the venue listed below.       Follow Up Recommendations SNF    Equipment Recommendations  None recommended by PT    Recommendations for Other Services OT consult     Precautions / Restrictions Precautions Precautions: Fall Precaution Comments: colostomy Restrictions Weight Bearing Restrictions: No      Mobility  Bed Mobility Overal bed  mobility: Needs Assistance Bed Mobility: Supine to Sit;Sit to Supine  Supine to sit: Mod assist;HOB elevated Sit to supine: Mod assist   General bed mobility comments: VCs for log rolling technique to protect abdomen, min A to clear BLE off EOB and mod A to upright trunk with elevated HOB; mod A to return to sidelying then supine with heavy VCs for lod rolling technique    Transfers Overall transfer level: Needs assistance Equipment used: Rolling walker (2 wheeled) Transfers: Sit to/from Stand Sit to Stand: Max assist;From elevated surface    General transfer comment: max A from elevated bed to achieve squatted standing position, pt able to clear bottom x2 attempts but upright to extend knees and upright posture, pt very unsteady and unable to perform standing marching or take sidesteps up to [employer] so returned to sitting  Ambulation/Gait  General Gait Details: unable  Stairs            Wheelchair Mobility    Modified Rankin (Stroke Patients Only)       Balance Overall balance assessment: Needs assistance Sitting-balance support: Feet supported Sitting balance-Leahy Scale: Fair Sitting balance - Comments: seated EOB   Standing balance support: During functional activity;Bilateral upper extremity supported Standing balance-Leahy Scale: Poor Standing balance comment: mod A with RW       Pertinent Vitals/Pain Pain Assessment: Faces Faces Pain Scale: Hurts a little bit Pain Location: abdomen with mobility Pain Descriptors / Indicators: Grimacing;Sore Pain Intervention(s): Limited activity within patient's tolerance;Monitored during session;Repositioned    Home Living Family/patient expects to be discharged to:: Private residence Living Arrangements: Other relatives (Sister) Available Help at Discharge: Family;Available 24 hours/day  Type of Home: House Home Access: Stairs to enter Entrance Stairs-Rails: None Entrance Stairs-Number of Steps: 2 Home Layout: One  level Home Equipment: Walker - 2 wheels;Cane - quad;Wheelchair - manual      Prior Function Level of Independence: Independent with assistive device(s)  Comments: Pt reports independent with dressing, sponde baths, cooks and cleans lightly, receives mobile meals, using SPC or RW in household. Pt denies recent falls. Sister completes grocery shopping and provides transportation.     Hand Dominance        Extremity/Trunk Assessment   Upper Extremity Assessment Upper Extremity Assessment: Overall WFL for tasks assessed    Lower Extremity Assessment Lower Extremity Assessment: RLE deficits/detail;LLE deficits/detail RLE Deficits / Details: ankle AROM WNL, knee extension lacking ~20 deg, strength grossly 3+/5 throughout RLE Sensation: WNL LLE Deficits / Details: ankle AROM WNL, knee extension lacking ~45 deg, strength grossly 3+/5 throughout LLE Sensation: WNL    Cervical / Trunk Assessment Cervical / Trunk Assessment: Kyphotic  Communication   Communication: No difficulties  Cognition Arousal/Alertness: Awake/alert Behavior During Therapy: WFL for tasks assessed/performed Overall Cognitive Status: Within Functional Limits for tasks assessed       General Comments General comments (skin integrity, edema, etc.): HR up to 147 max with STS attempts, returned to 108 once back in supine- RN notified    Exercises     Assessment/Plan    PT Assessment Patient needs continued PT services  PT Problem List Decreased strength;Decreased range of motion;Decreased activity tolerance;Decreased balance;Decreased mobility;Decreased knowledge of use of DME;Obesity;Decreased skin integrity;Pain       PT Treatment Interventions DME instruction;Gait training;Functional mobility training;Therapeutic activities;Therapeutic exercise;Balance training;Patient/family education    PT Goals (Current goals can be found in the Care Plan section)  Acute Rehab PT Goals Patient Stated Goal: return home  with sister to assist PT Goal Formulation: With patient Time For Goal Achievement: 05/25/21 Potential to Achieve Goals: Good    Frequency Min 2X/week   Barriers to discharge        Co-evaluation               AM-PAC PT "6 Clicks" Mobility  Outcome Measure Help needed turning from your back to your side while in a flat bed without using bedrails?: A Lot Help needed moving from lying on your back to sitting on the side of a flat bed without using bedrails?: A Lot Help needed moving to and from a bed to a chair (including a wheelchair)?: Total Help needed standing up from a chair using your arms (e.g., wheelchair or bedside chair)?: A Lot Help needed to walk in hospital room?: Total Help needed climbing 3-5 steps with a railing? : Total 6 Click Score: 9    End of Session Equipment Utilized During Treatment: Gait belt Activity Tolerance: Patient tolerated treatment well Patient left: in bed;with call bell/phone within reach;with bed alarm set Nurse Communication: Mobility status;Need for lift equipment PT Visit Diagnosis: Unsteadiness on feet (R26.81);Other abnormalities of gait and mobility (R26.89);Muscle weakness (generalized) (M62.81)    Time: 2353-6144 PT Time Calculation (min) (ACUTE ONLY): 30 min   Charges:   PT Evaluation $PT Eval Low Complexity: 1 Low PT Treatments $Therapeutic Activity: 8-22 mins         Tori Viona Hosking PT, DPT 05/11/21, 10:49 AM

## 2021-05-12 DIAGNOSIS — K562 Volvulus: Secondary | ICD-10-CM | POA: Diagnosis not present

## 2021-05-12 LAB — BASIC METABOLIC PANEL
Anion gap: 9 (ref 5–15)
BUN: 18 mg/dL (ref 8–23)
CO2: 29 mmol/L (ref 22–32)
Calcium: 8.8 mg/dL — ABNORMAL LOW (ref 8.9–10.3)
Chloride: 99 mmol/L (ref 98–111)
Creatinine, Ser: 0.92 mg/dL (ref 0.61–1.24)
GFR, Estimated: >60 mL/min (ref >60)
Glucose, Bld: 114 mg/dL — ABNORMAL HIGH (ref 70–99)
Potassium: 3.8 mmol/L (ref 3.5–5.1)
Sodium: 137 mmol/L (ref 135–145)

## 2021-05-12 LAB — CBC
HCT: 35.2 % — ABNORMAL LOW (ref 39.0–52.0)
Hemoglobin: 10.9 g/dL — ABNORMAL LOW (ref 13.0–17.0)
MCH: 27.4 pg (ref 26.0–34.0)
MCHC: 31 g/dL (ref 30.0–36.0)
MCV: 88.4 fL (ref 80.0–100.0)
Platelets: 181 10*3/uL (ref 150–400)
RBC: 3.98 MIL/uL — ABNORMAL LOW (ref 4.22–5.81)
RDW: 16.3 % — ABNORMAL HIGH (ref 11.5–15.5)
WBC: 10.4 10*3/uL (ref 4.0–10.5)
nRBC: 0 % (ref 0.0–0.2)

## 2021-05-12 NOTE — Progress Notes (Addendum)
4 Days Post-Op  Subjective: CC: Doing well. No reported abdominal pain this morning. Had some soreness around his incision yesterday. Tolerating fld without n/v. Air and sweat out from colostomy. Worked with therapies yesterday who rec SNF.   Objective: Vital signs in last 24 hours: Temp:  [98 F (36.7 C)-98.3 F (36.8 C)] 98.3 F (36.8 C) (07/27 0615) Pulse Rate:  [85-108] 86 (07/27 0615) Resp:  [14-20] 20 (07/27 0615) BP: (110-125)/(65-77) 120/65 (07/27 0615) SpO2:  [100 %] 100 % (07/27 0615) Last BM Date: 05/07/21  Intake/Output from previous day: 07/26 0701 - 07/27 0700 In: 675 [P.O.:675] Out: 650 [Urine:650] Intake/Output this shift: No intake/output data recorded.  PE: Gen:  NAD Card:  Reg Pulm:  Normal rate and effort  Abd: Soft, ND, NT except near midline incision which appears appropriate. +BS, ostomy pink and viable. Some sweat mixed and air in colostomy bag. Midline wound clean with staples in place. No drainage expressed. Faint bruising at the base of the wound extending left without signs of obvious cellulitis.  Ext:  No LE edema or calf tenderness Psych: A&Ox3 Skin: no rashes noted, warm and dry  Lab Results:  Recent Labs    05/11/21 0547 05/12/21 0518  WBC 11.7* 10.4  HGB 11.0* 10.9*  HCT 35.4* 35.2*  PLT 147* 181   BMET Recent Labs    05/12/21 0518  NA 137  K 3.8  CL 99  CO2 29  GLUCOSE 114*  BUN 18  CREATININE 0.92  CALCIUM 8.8*   PT/INR No results for input(s): LABPROT, INR in the last 72 hours. CMP     Component Value Date/Time   NA 137 05/12/2021 0518   K 3.8 05/12/2021 0518   CL 99 05/12/2021 0518   CO2 29 05/12/2021 0518   GLUCOSE 114 (H) 05/12/2021 0518   BUN 18 05/12/2021 0518   CREATININE 0.92 05/12/2021 0518   CALCIUM 8.8 (L) 05/12/2021 0518   PROT 6.5 05/07/2021 0900   ALBUMIN 3.5 05/07/2021 0900   AST 48 (H) 05/07/2021 0900   ALT 35 05/07/2021 0900   ALKPHOS 52 05/07/2021 0900   BILITOT 0.6 05/07/2021 0900    GFRNONAA >60 05/12/2021 0518   Lipase  No results found for: LIPASE  Studies/Results: No results found.  Anti-infectives: Anti-infectives (From admission, onward)    Start     Dose/Rate Route Frequency Ordered Stop   05/09/21 0600  cefoTEtan (CEFOTAN) 2 g in sodium chloride 0.9 % 100 mL IVPB  Status:  Discontinued        2 g 200 mL/hr over 30 Minutes Intravenous On call to O.R. 05/08/21 1423 05/08/21 1427   05/08/21 0815  cefOXitin (MEFOXIN) 2 g in sodium chloride 0.9 % 100 mL IVPB        2 g 200 mL/hr over 30 Minutes Intravenous  Once 05/08/21 0724 05/08/21 1143        Assessment/Plan POD 4 s/p exploratory laparotomy, sigmoid colectomy, colostomy for volvulus of sigmoid colon - Dr. Carolynne Edouard - 05/08/21 - Adv to soft diet - WOCN following - Mobilize, PT rec SNF - Pulm toilet - Path negative    FEN - Soft, IVF per TRH VTE - SCDs, okay for chemical prophylaxis from a general surgery standpoint ID - Rocephin/cefotetan periop Foley - d/c'd 7/25   ABL anemia - hgb stable HTN HLD Schizophrenia BPH     LOS: 5 days    Jacinto Halim , [facility] 05/12/2021, 8:44 AM Please  see Amion for pager number during day hours 7:00am-4:30pm

## 2021-05-12 NOTE — Progress Notes (Signed)
PROGRESS NOTE    Brandon [name]  YBO:[id number] DOB: 08-May-1946 DOA: 05/05/2021 PCP: Karl Ito, DO     Brief Narrative:  Brandon [name] is a 75 years old male with PMH significant for hypertension, bilateral lower extremity venous insufficiency, chronic kidney disease stage II and schizophrenia presented in the ED with intermittent abdominal pain for the last 3 days associated with vomiting.  CT abdomen and pelvis showed sigmoid volvulus,  moderate distention of the affected sigmoid colon, moderate mesenteric edema involving sigmoid mesentery and mild ascites,  no free intraperitoneal gas or pneumatosis.  GI and general surgery consulted.  Patient underwent decompression/reduction of sigmoid volvulus but failed.  Patient underwent exploratory laparotomy sigmoid colectomy and colostomy placement.   New events last 24 hours / Subjective: Patient voices no new complaints, tolerating full liquid diet without any issues.  Awaiting SNF placement.  Assessment & Plan:   Principal Problem:   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(HCC) Active Problems:   HTN (hypertension)   Schizophrenia (HCC)  Acute sigmoid volvulus Status post ex lap, sigmoid colectomy, colostomy by Dr. Carolynne Edouard 7/23 Advance diet to soft   Hypertension  Blood pressure on the lower side Hold BP medications.   Hyperlipidemia Continue Crestor   Schizophrenia Continue bupropion, trifluperazine   BPH Continue Proscar, Flomax  DVT prophylaxis:  enoxaparin (LOVENOX) injection 40 mg Start: 05/11/21 1015 SCD's Start: 05/08/21 1424 Place and maintain sequential compression device Start: 05/06/21 0333  Code Status:     Code Status Orders  (From admission, onward)           Start     Ordered   05/06/21 0246  Full code  Continuous        05/06/21 0246           Code Status History     This patient has a current code status but no historical code status.      Advance Directive Documentation    Flowsheet Row Most Recent  Value  Type of Advance Directive Living will  Pre-existing out of facility DNR order (yellow form or pink MOST form) --  "MOST" Form in Place? --      Family Communication: None at bedside Disposition Plan:  Status is: Inpatient  Remains inpatient appropriate because:Unsafe d/c plan  Dispo: The patient is from: Home              Anticipated d/c is to: SNF              Patient currently is medically stable to d/c.   Difficult to place patient No     Antimicrobials:  Anti-infectives (From admission, onward)    Start     Dose/Rate Route Frequency Ordered Stop   05/09/21 0600  cefoTEtan (CEFOTAN) 2 g in sodium chloride 0.9 % 100 mL IVPB  Status:  Discontinued        2 g 200 mL/hr over 30 Minutes Intravenous On call to O.R. 05/08/21 1423 05/08/21 1427   05/08/21 0815  cefOXitin (MEFOXIN) 2 g in sodium chloride 0.9 % 100 mL IVPB        2 g 200 mL/hr over 30 Minutes Intravenous  Once 05/08/21 0724 05/08/21 1143        Objective: Vitals:   05/11/21 1442 05/11/21 2009 05/12/21 0615 05/12/21 1254  BP: 125/66 110/77 120/65 125/74  Pulse: 85 (!) 108 86 (!) 101  Resp: 14 18 20 20   Temp: 98.1 F (36.7 C) 98 F (36.7 C) 98.3 F (36.8 C)  98.1 F (36.7 C)  TempSrc: Oral Oral Oral Oral  SpO2: 100% 100% 100% 99%  Weight:      Height:        Intake/Output Summary (Last 24 hours) at 05/12/2021 1437 Last data filed at 05/12/2021 0819 Gross per 24 hour  Intake 360 ml  Output 950 ml  Net -590 ml   Filed Weights   05/05/21 2240 05/07/21 1422  Weight: 99.8 kg 99.8 kg    Examination:  General exam: Appears calm and comfortable  Respiratory system: Clear to auscultation. Respiratory effort normal. No respiratory distress. No conversational dyspnea.  On nasal cannula O2 Cardiovascular system: S1 & S2 heard, RRR. No murmurs. No pedal edema. Gastrointestinal system: Abdomen is nondistended, soft and nontender. + Colostomy in place, abdominal dressing in place Central nervous  system: Alert . No focal neurological deficits. Speech clear.  Extremities: Symmetric in appearance  Skin: No rashes, lesions or ulcers on exposed skin  Psychiatry: Appears to be stable  Data Reviewed: I have personally reviewed following labs and imaging studies  CBC: Recent Labs  Lab 05/05/21 2300 05/07/21 0900 05/08/21 0515 05/11/21 0547 05/12/21 0518  WBC 10.6* 15.1* 11.2* 11.7* 10.4  NEUTROABS 9.3*  --   --   --   --   HGB 11.6* 12.9* 12.3* 11.0* 10.9*  HCT 36.8* 40.7 38.9* 35.4* 35.2*  MCV 85.0 87.2 86.4 88.3 88.4  PLT 170 184 190 147* 181   Basic Metabolic Panel: Recent Labs  Lab 05/05/21 2300 05/07/21 0900 05/08/21 0515 05/09/21 0625 05/12/21 0518  NA 138 141 139 137 137  K 3.4* 3.3* 3.1* 3.5 3.8  CL 100 102 102 103 99  CO2 25 29 26 26 29   GLUCOSE 127* 92 83 93 114*  BUN 27* 28* 30* 29* 18  CREATININE 1.16 1.22 1.09 1.17 0.92  CALCIUM 9.7 8.9 8.4* 8.2* 8.8*  MG  --  2.1 2.1  --   --   PHOS  --  2.7 3.8  --   --    GFR: Estimated Creatinine Clearance: 90.2 mL/min (by C-G formula based on SCr of 0.92 mg/dL). Liver Function Tests: Recent Labs  Lab 05/07/21 0900  AST 48*  ALT 35  ALKPHOS 52  BILITOT 0.6  PROT 6.5  ALBUMIN 3.5   No results for input(s): LIPASE, AMYLASE in the last 168 hours. No results for input(s): AMMONIA in the last 168 hours. Coagulation Profile: Recent Labs  Lab 05/07/21 0900  INR 1.1   Cardiac Enzymes: No results for input(s): CKTOTAL, CKMB, CKMBINDEX, TROPONINI in the last 168 hours. BNP (last 3 results) No results for input(s): PROBNP in the last 8760 hours. HbA1C: No results for input(s): HGBA1C in the last 72 hours. CBG: No results for input(s): GLUCAP in the last 168 hours. Lipid Profile: No results for input(s): CHOL, HDL, LDLCALC, TRIG, CHOLHDL, LDLDIRECT in the last 72 hours. Thyroid Function Tests: No results for input(s): TSH, T4TOTAL, FREET4, T3FREE, THYROIDAB in the last 72 hours. Anemia Panel: No results  for input(s): VITAMINB12, FOLATE, FERRITIN, TIBC, IRON, RETICCTPCT in the last 72 hours. Sepsis Labs: No results for input(s): PROCALCITON, LATICACIDVEN in the last 168 hours.  Recent Results (from the past 240 hour(s))  Resp Panel by RT-PCR (Flu A&B, Covid) Nasopharyngeal Swab     Status: None   Collection Time: 05/06/21  1:00 AM   Specimen: Nasopharyngeal Swab; Nasopharyngeal(NP) swabs in vial transport medium  Result Value Ref Range Status   SARS Coronavirus 2 by RT  PCR NEGATIVE NEGATIVE Final    Comment: (NOTE) SARS-CoV-2 target nucleic acids are NOT DETECTED.  The SARS-CoV-2 RNA is generally detectable in upper respiratory specimens during the acute phase of infection. The lowest concentration of SARS-CoV-2 viral copies this assay can detect is 138 copies/mL. A negative result does not preclude SARS-Cov-2 infection and should not be used as the sole basis for treatment or other patient management decisions. A negative result may occur with  improper specimen collection/handling, submission of specimen other than nasopharyngeal swab, presence of viral mutation(s) within the areas targeted by this assay, and inadequate number of viral copies(<138 copies/mL). A negative result must be combined with clinical observations, patient history, and epidemiological information. The expected result is Negative.  Fact Sheet for Patients:  BloggerCourse.com  Fact Sheet for Healthcare Providers:  SeriousBroker.it  This test is no t yet approved or cleared by the [place] FDA and  has been authorized for detection and/or diagnosis of SARS-CoV-2 by FDA under an Emergency Use Authorization (EUA). This EUA will remain  in effect (meaning this test can be used) for the duration of the COVID-19 declaration under Section 564(b)(1) of the Act, 21 U.S.C.section 360bbb-3(b)(1), unless the authorization is terminated  or revoked sooner.        Influenza A by PCR NEGATIVE NEGATIVE Final   Influenza B by PCR NEGATIVE NEGATIVE Final    Comment: (NOTE) The Xpert Xpress SARS-CoV-2/FLU/RSV plus assay is intended as an aid in the diagnosis of influenza from Nasopharyngeal swab specimens and should not be used as a sole basis for treatment. Nasal washings and aspirates are unacceptable for Xpert Xpress SARS-CoV-2/FLU/RSV testing.  Fact Sheet for Patients: BloggerCourse.com  Fact Sheet for Healthcare Providers: SeriousBroker.it  This test is not yet approved or cleared by the [place] FDA and has been authorized for detection and/or diagnosis of SARS-CoV-2 by FDA under an Emergency Use Authorization (EUA). This EUA will remain in effect (meaning this test can be used) for the duration of the COVID-19 declaration under Section 564(b)(1) of the Act, 21 U.S.C. section 360bbb-3(b)(1), unless the authorization is terminated or revoked.  Performed at [facility], [address], [place], [place] [address]   MRSA Next Gen by PCR, Nasal     Status: None   Collection Time: 05/08/21  3:53 AM   Specimen: Nasal Mucosa; Nasal Swab  Result Value Ref Range Status   MRSA by PCR Next Gen NOT DETECTED NOT DETECTED Final    Comment: (NOTE) The GeneXpert MRSA Assay (FDA approved for NASAL specimens only), is one component of a comprehensive MRSA colonization surveillance program. It is not intended to diagnose MRSA infection nor to guide or monitor treatment for MRSA infections. Test performance is not FDA approved in patients less than 38 years old. Performed at [facility], [address], [place], [place] [address]       Radiology Studies: No results found.    Scheduled Meds:  buPROPion  150 mg Oral Daily   Chlorhexidine Gluconate Cloth  6 each Topical Daily   enoxaparin (LOVENOX) injection  40 mg Subcutaneous Daily   feeding supplement   1 Container Oral TID BM   finasteride  5 mg Oral Daily   latanoprost  1 drop Both Eyes QHS   multivitamin with minerals  1 tablet Oral Daily   oxybutynin  5 mg Oral Daily   pantoprazole  40 mg Oral Daily   rosuvastatin  5 mg Oral QHS   tamsulosin  0.4 mg Oral Daily   trifluoperazine  5 mg Oral QHS   Continuous Infusions:   LOS: 5 days      Time spent: 20 minutes   Noralee Stain, DO [facility] 05/12/2021, 2:37 PM   Available via Epic secure chat 7am-7pm After these hours, please refer to coverage provider listed on [email or web address]

## 2021-05-12 NOTE — TOC Progression Note (Signed)
Transition of Care [facility]) - Progression Note    Patient Details  Name: MALOSI HEMSTREET MRN: [id number] Date of Birth: 1945-11-17  Transition of Care [facility]) CM/SW Contact  Ida Rogue, [place] Phone Number: 05/12/2021, 2:45 PM  Clinical Narrative:   Spoke with both sisters today on 3 way call.  Mr Frazier Richards lives with his sister Stanton Kidney; was ambulatory and able to do ADL's prior to admission.  He has been getting the same mental health medications prescribed for years at [facility] issues.  They are both in favor of short term rehab due to his weakened condition.  Would prefer a bed in [facility], [facility], [place], [place].  So far only offer is [facility]. Working on English as a second language teacher. TOC will continue to follow during the course of hospitalization.     Expected Discharge Plan: Skilled Nursing Facility Barriers to Discharge: SNF Pending bed offer  Expected Discharge Plan and Services Expected Discharge Plan: Skilled Nursing Facility   Discharge Planning Services: CM Consult Post Acute Care Choice: Skilled Nursing Facility Living arrangements for the past 2 months: Single Family Home                                       Social Determinants of Health (SDOH) Interventions    Readmission Risk Interventions No flowsheet data found.

## 2021-05-13 DIAGNOSIS — K562 Volvulus: Secondary | ICD-10-CM | POA: Diagnosis not present

## 2021-05-13 LAB — RESP PANEL BY RT-PCR (FLU A&B, COVID) ARPGX2
Influenza A by PCR: NEGATIVE
Influenza B by PCR: NEGATIVE
SARS Coronavirus 2 by RT PCR: NEGATIVE

## 2021-05-13 NOTE — Consult Note (Signed)
WOC Nurse ostomy follow up Stoma type/location: LLQ, end colostomy  Stomal assessment/size:  1 1/4" x 2" oval shaped, flush, pink Peristomal assessment: intact  Treatment options for stomal/peristomal skin: 2" skin barrier ring Output loose brown stool, +flatus Ostomy pouching: 2pc. 2 3/4" with 2" skin barrier ring Education provided: none, patient can not stay awake. Pouch change completed in entirety by [facility] nurse  Recall limited and ability to perform care seems unlikely  Pleasant fellow, but very flightily in conversation Enrolled patient in [place] Secure Start Discharge program: Yes  WOC Nurse will follow along with you for continued support with ostomy teaching and care Nayib Remer [facility] MSN, RN, [place], CNS, [place] [phone]

## 2021-05-13 NOTE — Progress Notes (Signed)
COVID results received and faxed to [facility] health care center at this time.

## 2021-05-13 NOTE — Discharge Summary (Addendum)
Physician Discharge Summary  Brandon [name] FFM:[id number] DOB: 1946-01-08 DOA: 05/05/2021  PCP: Karl Ito, DO  Admit date: 05/05/2021 Discharge date: 05/13/2021  Admitted From: Home Disposition:  SNF   Recommendations for Outpatient Follow-up:  Follow up with PCP in 1 week Follow up with general surgery as scheduled Patient and family will need ostomy emptying and care education   Discharge Condition: Stable CODE STATUS: Full code  Diet recommendation: Soft diet  Brief/Interim Summary: Brandon [name] is a 75 years old male with PMH significant for hypertension, bilateral lower extremity venous insufficiency, chronic kidney disease stage II and schizophrenia presented in the ED with intermittent abdominal pain for the last 3 days associated with vomiting.  CT abdomen and pelvis showed sigmoid volvulus,  moderate distention of the affected sigmoid colon, moderate mesenteric edema involving sigmoid mesentery and mild ascites,  no free intraperitoneal gas or pneumatosis.  GI and general surgery consulted.  Patient underwent decompression/reduction of sigmoid volvulus but failed.  Patient underwent exploratory laparotomy sigmoid colectomy and colostomy placement.   Discharge Diagnoses:  Principal Problem:   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(HCC) Active Problems:   HTN (hypertension)   Schizophrenia (HCC)   Acute sigmoid volvulus Status post ex lap, sigmoid colectomy, colostomy by Dr. Carolynne Edouard 7/23 Tolerating soft diet He will need ostomy care teaching. He has so far been unable to participate in education due to weakness and perhaps his underlying schizophrenia. Family encouraged to help as they can.    Hypertension BP well controlled without antihypertensives    Hyperlipidemia Continue Crestor   Schizophrenia Continue bupropion, trifluperazine   BPH Continue Proscar, Flomax  Discharge Instructions  Discharge Instructions     Amb Referral to Ostomy Clinic   Complete by: As directed     Reason for referral modifiers: Pre and post-operative counseling for ostomy management   Discharge wound care:   Complete by: As directed    Keep wound clean and dry   Increase activity slowly   Complete by: As directed       Allergies as of 05/13/2021   No Known Allergies      Medication List     STOP taking these medications    atorvastatin 20 MG tablet Commonly known as: LIPITOR   furosemide 40 MG tablet Commonly known as: LASIX   losartan 50 MG tablet Commonly known as: COZAAR       TAKE these medications    aspirin EC 81 MG tablet Take 81 mg by mouth daily.   buPROPion 150 MG 12 hr tablet Commonly known as: WELLBUTRIN SR Take 150 mg by mouth daily.   finasteride 5 MG tablet Commonly known as: PROSCAR Take 5 mg by mouth daily.   fluticasone 50 MCG/ACT nasal spray Commonly known as: FLONASE Place 1-2 sprays into both nostrils daily as needed for allergies.   omeprazole 20 MG capsule Commonly known as: PRILOSEC Take 20 mg by mouth daily.   oxybutynin 5 MG 24 hr tablet Commonly known as: DITROPAN-XL Take 5 mg by mouth daily.   rosuvastatin 5 MG tablet Commonly known as: CRESTOR Take 5 mg by mouth at bedtime.   tamsulosin 0.4 MG Caps capsule Commonly known as: FLOMAX Take 0.4 mg by mouth daily.   Travoprost (BAK Free) 0.004 % Soln ophthalmic solution Commonly known as: TRAVATAN Place 1 drop into both eyes at bedtime.   trifluoperazine 5 MG tablet Commonly known as: STELAZINE Take 5 mg by mouth at bedtime.  Discharge Care Instructions  (From admission, onward)           Start     Ordered   05/13/21 0000  Discharge wound care:       Comments: Keep wound clean and dry   05/13/21 1340            Contact information for follow-up providers     Chevis Pretty III, MD Follow up on 06/04/2021.   Specialty: General Surgery Why: 350pm. Please arrive 30 minutes prior to your appointment for paperwork. Please bring a copy  of your photo ID and insurance card to the appointment. Contact information: [address] [place] [address] [phone]         [facility] Follow up on 05/21/2021.   Specialty: General Surgery Why: 8/5 at 2pm for staple removal. This will be a nurse visit. Please arrive 30 minutes prior to your appointment for paperwork. Please bring a copy of your photo ID and insurance card to the appointment. Contact information: [address] [place] [address] [phone]         Karl Ito, DO. Schedule an appointment as soon as possible for a visit in 1 week(s).   Specialty: General Practice Contact information: [address] [place] [address] [phone]              Contact information for after-discharge care     Destination     [facility] Preferred SNF .   Service: Skilled Nursing Contact information: [address] [place] [address] [phone]                    No Known Allergies  Consultations: GI General surgery    Procedures/Studies: CT ABDOMEN PELVIS WO CONTRAST  Result Date: 05/06/2021 CLINICAL DATA:  Bowel obstruction EXAM: CT ABDOMEN AND PELVIS WITHOUT CONTRAST TECHNIQUE: Multidetector CT imaging of the abdomen and pelvis was performed following the standard protocol without IV contrast. COMPARISON:  None. FINDINGS: Lower chest: The visualized lung bases are clear. Small hiatal hernia present. Fluid is seen within a distended distal esophagus likely reflecting changes of gastroesophageal reflux. Cardiac size within normal limits. No pericardial effusion. Hepatobiliary: No focal liver abnormality is seen. No gallstones, gallbladder wall thickening, or biliary dilatation. Pancreas: Unremarkable Spleen: Unremarkable Adrenals/Urinary Tract: Adrenal glands are unremarkable. Kidneys are normal, without renal calculi, focal lesion, or hydronephrosis. Bladder is  unremarkable. Stomach/Bowel: There is sigmoid volvulus identified with distension of the involved colon up to 9 cm in greatest dimension. The mesenteric twist is best visualized within the mid abdomen axial image # 54/2. There is moderate mesenteric edema involving the affected redundant sigmoid colon. There is trace ascites present. No free intraperitoneal gas. No pneumatosis identified. The stomach and small bowel are unremarkable. Moderate stool seen within the more proximal colon. The appendix is unremarkable. Vascular/Lymphatic: Mild aortoiliac atherosclerotic calcification. No aortic aneurysm. No pathologic adenopathy within the abdomen and pelvis. Reproductive: Prostate is unremarkable. Small right varicocele noted. The right testicle appears located within the right inguinal canal. Other: Tiny fat containing umbilical hernia Musculoskeletal: Degenerative changes are seen within the lumbar spine. No lytic or blastic bone lesion identified. IMPRESSION: Sigmoid volvulus. Moderate distension of the affected sigmoid colon, moderate mesenteric edema involving the sigmoid mesentery, and mild ascites present. No free intraperitoneal gas or pneumatosis to suggest bowel infarction or perforation. Small hiatal hernia. Fluid-filled distal esophagus in keeping with probable gastroesophageal reflux. Aortic Atherosclerosis (ICD10-I70.0).  Electronically Signed   By: Helyn Numbers MD   On: 05/06/2021 00:17   DG Abd 1 View  Result Date: 05/05/2021 CLINICAL DATA:  Umbilical pain EXAM: ABDOMEN - 1 VIEW COMPARISON:  None. FINDINGS: Large amount of dilated bowel, predominantly in the right hemiabdomen. Large amount of ascending colonic stool. The cecum appears to be twisted and projecting toward the left upper quadrant. 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 CT abdomen and pelvis recommended for confirmation. I attempted to call critical Value/emergent results by telephone at the time of interpretation on 05/05/2021 at 9:16 pm  to provider [facility], but the referring urgent care had closed and there was no on-call provider. According to their notes, they had referred the patient to the ER at [facility] for a CT scan. He had not arrived there at the time of this report. I also attempted to contact the patient on his home and mobile phones, but was not able to reach him. Electronically Signed   By: Deatra Robinson M.D.   On: 05/05/2021 21:18   DG Abd 2 Views  Result Date: 05/07/2021 CLINICAL DATA:  Encounter for polyp ileus of the sigmoid colon. EXAM: ABDOMEN - 2 VIEW COMPARISON:  One-view abdomen and CT abdomen pelvis 05/05/2021 FINDINGS: Dilated colon is partially decompressed compared to the prior exam. Decreased stool burden noted. Multiple fluid levels again noted within both large and small bowel. IMPRESSION: 1. Partial decompression of colon with decreasing stool burden. 2. Fluid levels consistent with persistent obstruction/ileus Electronically Signed   By: Marin Roberts M.D.   On: 05/07/2021 14:00       Discharge Exam: Vitals:   05/13/21 0430 05/13/21 1330  BP: 111/64 116/65  Pulse: 95 96  Resp: 15 20  Temp: 97.7 F (36.5 C) 97.9 F (36.6 C)  SpO2: 100% 100%    General: Pt is alert, awake, not in acute distress Cardiovascular: RRR, S1/S2 +, no edema Respiratory: CTA bilaterally, no wheezing, no rhonchi, no respiratory distress, no conversational dyspnea  Abdominal: Soft, NT, ND, bowel sounds +ostomy in place  Extremities: no edema, no cyanosis Psych: Stable    The results of significant diagnostics from this hospitalization (including imaging, microbiology, ancillary and laboratory) are listed below for reference.     Microbiology: Recent Results (from the past 240 hour(s))  Resp Panel by RT-PCR (Flu A&B, Covid) Nasopharyngeal Swab     Status: None   Collection Time: 05/06/21  1:00 AM   Specimen: Nasopharyngeal Swab; Nasopharyngeal(NP) swabs in vial transport medium  Result  Value Ref Range Status   SARS Coronavirus 2 by RT PCR NEGATIVE NEGATIVE Final    Comment: (NOTE) SARS-CoV-2 target nucleic acids are NOT DETECTED.  The SARS-CoV-2 RNA is generally detectable in upper respiratory specimens during the acute phase of infection. The lowest concentration of SARS-CoV-2 viral copies this assay can detect is 138 copies/mL. A negative result does not preclude SARS-Cov-2 infection and should not be used as the sole basis for treatment or other patient management decisions. A negative result may occur with  improper specimen collection/handling, submission of specimen other than nasopharyngeal swab, presence of viral mutation(s) within the areas targeted by this assay, and inadequate number of viral copies(<138 copies/mL). A negative result must be combined with clinical observations, patient history, and epidemiological information. The expected result is Negative.  Fact Sheet for Patients:  BloggerCourse.com  Fact Sheet for Healthcare Providers:  SeriousBroker.it  This test is no t yet approved or cleared by the [place] and  has been authorized for detection and/or diagnosis of SARS-CoV-2 by FDA under an Emergency Use Authorization (EUA). This EUA will remain  in effect (meaning this test can be used) for the duration of the COVID-19 declaration under Section 564(b)(1) of the Act, 21 U.S.C.section 360bbb-3(b)(1), unless the authorization is terminated  or revoked sooner.       Influenza A by PCR NEGATIVE NEGATIVE Final   Influenza B by PCR NEGATIVE NEGATIVE Final    Comment: (NOTE) The Xpert Xpress SARS-CoV-2/FLU/RSV plus assay is intended as an aid in the diagnosis of influenza from Nasopharyngeal swab specimens and should not be used as a sole basis for treatment. Nasal washings and aspirates are unacceptable for Xpert Xpress SARS-CoV-2/FLU/RSV testing.  Fact Sheet for  Patients: BloggerCourse.com  Fact Sheet for Healthcare Providers: SeriousBroker.it  This test is not yet approved or cleared by the [place] FDA and has been authorized for detection and/or diagnosis of SARS-CoV-2 by FDA under an Emergency Use Authorization (EUA). This EUA will remain in effect (meaning this test can be used) for the duration of the COVID-19 declaration under Section 564(b)(1) of the Act, 21 U.S.C. section 360bbb-3(b)(1), unless the authorization is terminated or revoked.  Performed at [facility], [address] [place], [place] [address]   MRSA Next Gen by PCR, Nasal     Status: None   Collection Time: 05/08/21  3:53 AM   Specimen: Nasal Mucosa; Nasal Swab  Result Value Ref Range Status   MRSA by PCR Next Gen NOT DETECTED NOT DETECTED Final    Comment: (NOTE) The GeneXpert MRSA Assay (FDA approved for NASAL specimens only), is one component of a comprehensive MRSA colonization surveillance program. It is not intended to diagnose MRSA infection nor to guide or monitor treatment for MRSA infections. Test performance is not FDA approved in patients less than 50 years old. Performed at [facility], [address], [place], [place] [address]      Labs: BNP (last 3 results) No results for input(s): BNP in the last 8760 hours. Basic Metabolic Panel: Recent Labs  Lab 05/07/21 0900 05/08/21 0515 05/09/21 0625 05/12/21 0518  NA 141 139 137 137  K 3.3* 3.1* 3.5 3.8  CL 102 102 103 99  CO2 29 26 26 29   GLUCOSE 92 83 93 114*  BUN 28* 30* 29* 18  CREATININE 1.22 1.09 1.17 0.92  CALCIUM 8.9 8.4* 8.2* 8.8*  MG 2.1 2.1  --   --   PHOS 2.7 3.8  --   --    Liver Function Tests: Recent Labs  Lab 05/07/21 0900  AST 48*  ALT 35  ALKPHOS 52  BILITOT 0.6  PROT 6.5  ALBUMIN 3.5   No results for input(s): LIPASE, AMYLASE in the last 168 hours. No results for  input(s): AMMONIA in the last 168 hours. CBC: Recent Labs  Lab 05/07/21 0900 05/08/21 0515 05/11/21 0547 05/12/21 0518  WBC 15.1* 11.2* 11.7* 10.4  HGB 12.9* 12.3* 11.0* 10.9*  HCT 40.7 38.9* 35.4* 35.2*  MCV 87.2 86.4 88.3 88.4  PLT 184 190 147* 181   Cardiac Enzymes: No results for input(s): CKTOTAL, CKMB, CKMBINDEX, TROPONINI in the last 168 hours. BNP: Invalid input(s): POCBNP CBG: No results for input(s): GLUCAP in the last 168 hours. D-Dimer No results for input(s): DDIMER in the last 72 hours. Hgb A1c No results for input(s): HGBA1C in the last 72 hours. Lipid Profile No results for input(s): CHOL, HDL, LDLCALC, TRIG, CHOLHDL, LDLDIRECT  in the last 72 hours. Thyroid function studies No results for input(s): TSH, T4TOTAL, T3FREE, THYROIDAB in the last 72 hours.  Invalid input(s): FREET3 Anemia work up No results for input(s): VITAMINB12, FOLATE, FERRITIN, TIBC, IRON, RETICCTPCT in the last 72 hours. Urinalysis    Component Value Date/Time   LABSPEC >=1.030 05/05/2021 2012   PHURINE 5.5 05/05/2021 2012   GLUCOSEU NEGATIVE 05/05/2021 2012   HGBUR NEGATIVE 05/05/2021 2012   BILIRUBINUR NEGATIVE 05/05/2021 2012   KETONESUR 15 (A) 05/05/2021 2012   PROTEINUR 30 (A) 05/05/2021 2012   UROBILINOGEN 0.2 05/05/2021 2012   NITRITE NEGATIVE 05/05/2021 2012   LEUKOCYTESUR NEGATIVE 05/05/2021 2012   Sepsis Labs Invalid input(s): PROCALCITONIN,  WBC,  LACTICIDVEN Microbiology Recent Results (from the past 240 hour(s))  Resp Panel by RT-PCR (Flu A&B, Covid) Nasopharyngeal Swab     Status: None   Collection Time: 05/06/21  1:00 AM   Specimen: Nasopharyngeal Swab; Nasopharyngeal(NP) swabs in vial transport medium  Result Value Ref Range Status   SARS Coronavirus 2 by RT PCR NEGATIVE NEGATIVE Final    Comment: (NOTE) SARS-CoV-2 target nucleic acids are NOT DETECTED.  The SARS-CoV-2 RNA is generally detectable in upper respiratory specimens during the acute phase of  infection. The lowest concentration of SARS-CoV-2 viral copies this assay can detect is 138 copies/mL. A negative result does not preclude SARS-Cov-2 infection and should not be used as the sole basis for treatment or other patient management decisions. A negative result may occur with  improper specimen collection/handling, submission of specimen other than nasopharyngeal swab, presence of viral mutation(s) within the areas targeted by this assay, and inadequate number of viral copies(<138 copies/mL). A negative result must be combined with clinical observations, patient history, and epidemiological information. The expected result is Negative.  Fact Sheet for Patients:  BloggerCourse.comhttps://www.fda.gov/media/152166/download  Fact Sheet for Healthcare Providers:  SeriousBroker.ithttps://www.fda.gov/media/152162/download  This test is no t yet approved or cleared by the [place] FDA and  has been authorized for detection and/or diagnosis of SARS-CoV-2 by FDA under an Emergency Use Authorization (EUA). This EUA will remain  in effect (meaning this test can be used) for the duration of the COVID-19 declaration under Section 564(b)(1) of the Act, 21 U.S.C.section 360bbb-3(b)(1), unless the authorization is terminated  or revoked sooner.       Influenza A by PCR NEGATIVE NEGATIVE Final   Influenza B by PCR NEGATIVE NEGATIVE Final    Comment: (NOTE) The Xpert Xpress SARS-CoV-2/FLU/RSV plus assay is intended as an aid in the diagnosis of influenza from Nasopharyngeal swab specimens and should not be used as a sole basis for treatment. Nasal washings and aspirates are unacceptable for Xpert Xpress SARS-CoV-2/FLU/RSV testing.  Fact Sheet for Patients: BloggerCourse.comhttps://www.fda.gov/media/152166/download  Fact Sheet for Healthcare Providers: SeriousBroker.ithttps://www.fda.gov/media/152162/download  This test is not yet approved or cleared by the [place] FDA and has been authorized for detection and/or diagnosis of SARS-CoV-2  by FDA under an Emergency Use Authorization (EUA). This EUA will remain in effect (meaning this test can be used) for the duration of the COVID-19 declaration under Section 564(b)(1) of the Act, 21 U.S.C. section 360bbb-3(b)(1), unless the authorization is terminated or revoked.  Performed at [facility], [address], [place], [place] [id number]   MRSA Next Gen by PCR, Nasal     Status: None   Collection Time: 05/08/21  3:53 AM   Specimen: Nasal Mucosa; Nasal Swab  Result Value Ref Range Status   MRSA by PCR Next Gen NOT DETECTED  NOT DETECTED Final    Comment: (NOTE) The GeneXpert MRSA Assay (FDA approved for NASAL specimens only), is one component of a comprehensive MRSA colonization surveillance program. It is not intended to diagnose MRSA infection nor to guide or monitor treatment for MRSA infections. Test performance is not FDA approved in patients less than 5 years old. Performed at [facility], [address] [place], [place] [address]      Patient was seen and examined on the day of discharge and was found to be in stable condition. Time coordinating discharge: 40 minutes including assessment and coordination of care, as well as examination of the patient, discussion with both sisters, RN, SW, WOC RN.   SIGNED:  Noralee Stain, DO [facility] 05/13/2021, 1:41 PM

## 2021-05-13 NOTE — TOC Transition Note (Signed)
Transition of Care [facility]) - CM/SW Discharge Note   Patient Details  Name: Brandon [name] MRN: [id number] Date of Birth: 03-14-1946  [facility] CM/SW Contact:  Ida Rogue, LCSW Phone Number: 05/13/2021, 3:20 PM   Clinical Narrative:   Patient who is stable for discharge will transfer to [facility] SNF.  Family alerted.  PTAR arranged.  Nursing, please call report [phone].  TOC sign off.    Final next level of care: Skilled Nursing Facility Barriers to Discharge: Barriers Resolved   Patient Goals and CMS Choice     Choice offered to / list presented to : Patient, Sibling  Discharge Placement                       Discharge Plan and Services   Discharge Planning Services: CM Consult Post Acute Care Choice: Skilled Nursing Facility                               Social Determinants of Health (SDOH) Interventions     Readmission Risk Interventions No flowsheet data found.

## 2021-05-13 NOTE — Discharge Instructions (Signed)
[facility]      [facility] [phone]  OPEN ABDOMINAL SURGERY: POST OP INSTRUCTIONS  Always review your discharge instruction sheet given to you by the facility where your surgery was performed.  IF YOU HAVE DISABILITY OR FAMILY LEAVE FORMS, YOU MUST BRING THEM TO THE OFFICE FOR PROCESSING.  PLEASE DO NOT GIVE THEM TO YOUR DOCTOR.  A prescription for pain medication may be given to you upon discharge.  Take your pain medication as prescribed, if needed.  If narcotic pain medicine is not needed, then you may take acetaminophen (Tylenol) or ibuprofen (Advil) as needed. Take your usually prescribed medications unless otherwise directed. If you need a refill on your pain medication, please contact your pharmacy. They will contact our office to request authorization.  Prescriptions will not be filled after 5pm or on week-ends. You should follow a light diet the first few days after arrival home, such as soup and crackers, pudding, etc.unless your doctor has advised otherwise. A high-fiber, low fat diet can be resumed as tolerated.   Be sure to include lots of fluids daily. Most patients will experience some swelling and bruising on the chest and neck area.  Ice packs will help.  Swelling and bruising can take several days to resolve Most patients will experience some swelling and bruising in the area of the incision. Ice pack will help. Swelling and bruising can take several days to resolve..  It is common to experience some constipation if taking pain medication after surgery.  Increasing fluid intake and taking a stool softener will usually help or prevent this problem from occurring.  A mild laxative (Milk of Magnesia or Miralax) should be taken according to package directions if there are no bowel movements after 48 hours.  You may have steri-strips (small skin tapes) in place directly over the incision.  These strips should be left on the skin for 7-10 days.  If your surgeon used skin  glue on the incision, you may shower in 24 hours.  The glue will flake off over the next 2-3 weeks.  Any sutures or staples will be removed at the office during your follow-up visit. You may find that a light gauze bandage over your incision may keep your staples from being rubbed or pulled. You may shower and replace the bandage daily. ACTIVITIES:  You may resume regular (light) daily activities beginning the next day--such as daily self-care, walking, climbing stairs--gradually increasing activities as tolerated.  You may have sexual intercourse when it is comfortable.  Refrain from any heavy lifting or straining until approved by your doctor. You may drive when you no longer are taking prescription pain medication, you can comfortably wear a seatbelt, and you can safely maneuver your car and apply brakes Return to Work: ___________________________________ You should see your doctor in the office for a follow-up appointment approximately two weeks after your surgery.  Make sure that you call for this appointment within a day or two after you arrive home to insure a convenient appointment time. OTHER INSTRUCTIONS:  _____________________________________________________________ _____________________________________________________________  WHEN TO CALL YOUR DOCTOR: Fever over 101.0 Inability to urinate Nausea and/or vomiting Extreme swelling or bruising Continued bleeding from incision. Increased pain, redness, or drainage from the incision. Difficulty swallowing or breathing Muscle cramping or spasms. Numbness or tingling in hands or feet or around lips.  The clinic staff is available to answer your questions during regular business hours.  Please don't hesitate to call and ask to speak to one of   the nurses if you have concerns.  For further questions, please visit [email or web address]  

## 2021-05-13 NOTE — TOC Progression Note (Signed)
Transition of Care [facility]) - Progression Note    Patient Details  Name: Brandon [name] MRN: [id number] Date of Birth: [birth year]  Transition of Care [facility]) CM/SW Contact  Ida Rogue, [place] Phone Number: 05/13/2021, 11:41 AM  Clinical Narrative:   Authorization received for patient to go to [id number], reference [id number], good 5 days, 7/28-8/1.     Expected Discharge Plan: Skilled Nursing Facility Barriers to Discharge: SNF Pending bed offer  Expected Discharge Plan and Services Expected Discharge Plan: Skilled Nursing Facility   Discharge Planning Services: CM Consult Post Acute Care Choice: Skilled Nursing Facility Living arrangements for the past 2 months: Single Family Home                                       Social Determinants of Health (SDOH) Interventions    Readmission Risk Interventions No flowsheet data found.

## 2021-05-13 NOTE — Care Management Important Message (Signed)
Important Message  Patient Details IM Letter given to the Patient. Name: Brandon [name] MRN: [id number] Date of Birth: 1945/12/26   Medicare Important Message Given:  Yes     Caren Macadam 05/13/2021, 10:26 AM

## 2021-05-13 NOTE — Progress Notes (Signed)
Patient had 23 beat run Vtach. Went to check on patient and he was sleeping comfortably. Notified on-call provider. Will continue to monitor.

## 2021-05-13 NOTE — Progress Notes (Signed)
    5 Days Post-Op  Subjective: CC: Doing well. No reported abdominal pain this morning. Tolerating soft diet without n/v. Colostomy with stool. Reports he did not get out of bed yesterday.  Objective: Vital signs in last 24 hours: Temp:  [97.7 F (36.5 C)-98.1 F (36.7 C)] 97.7 F (36.5 C) (07/28 0430) Pulse Rate:  [95-101] 95 (07/28 0430) Resp:  [15-20] 15 (07/28 0430) BP: (111-125)/(64-74) 111/64 (07/28 0430) SpO2:  [99 %-100 %] 100 % (07/28 0430) Last BM Date: 05/13/21  Intake/Output from previous day: 07/27 0701 - 07/28 0700 In: 840 [P.O.:840] Out: 2050 [Urine:1850; Stool:200] Intake/Output this shift: No intake/output data recorded.  PE: Gen:  NAD Pulm:  Normal rate and effort  Abd: Soft, ND, NT except near midline incision which appears appropriate. +BS, ostomy pink and viable. Stool in colostomy bag. Midline wound clean with staples in place. No drainage expressed. Faint bruising at the base of the wound extending left without signs of obvious cellulitis and improving Ext:  SCDs in place Skin: no rashes noted, warm and dry  Lab Results:  Recent Labs    05/11/21 0547 05/12/21 0518  WBC 11.7* 10.4  HGB 11.0* 10.9*  HCT 35.4* 35.2*  PLT 147* 181   BMET Recent Labs    05/12/21 0518  NA 137  K 3.8  CL 99  CO2 29  GLUCOSE 114*  BUN 18  CREATININE 0.92  CALCIUM 8.8*   PT/INR No results for input(s): LABPROT, INR in the last 72 hours. CMP     Component Value Date/Time   NA 137 05/12/2021 0518   K 3.8 05/12/2021 0518   CL 99 05/12/2021 0518   CO2 29 05/12/2021 0518   GLUCOSE 114 (H) 05/12/2021 0518   BUN 18 05/12/2021 0518   CREATININE 0.92 05/12/2021 0518   CALCIUM 8.8 (L) 05/12/2021 0518   PROT 6.5 05/07/2021 0900   ALBUMIN 3.5 05/07/2021 0900   AST 48 (H) 05/07/2021 0900   ALT 35 05/07/2021 0900   ALKPHOS 52 05/07/2021 0900   BILITOT 0.6 05/07/2021 0900   GFRNONAA >60 05/12/2021 0518   Lipase  No results found for:  LIPASE  Studies/Results: No results found.  Anti-infectives: Anti-infectives (From admission, onward)    Start     Dose/Rate Route Frequency Ordered Stop   05/09/21 0600  cefoTEtan (CEFOTAN) 2 g in sodium chloride 0.9 % 100 mL IVPB  Status:  Discontinued        2 g 200 mL/hr over 30 Minutes Intravenous On call to O.R. 05/08/21 1423 05/08/21 1427   05/08/21 0815  cefOXitin (MEFOXIN) 2 g in sodium chloride 0.9 % 100 mL IVPB        2 g 200 mL/hr over 30 Minutes Intravenous  Once 05/08/21 0724 05/08/21 1143        Assessment/Plan POD 5 s/p exploratory laparotomy, sigmoid colectomy, colostomy for volvulus of sigmoid colon - Dr. Carolynne Edouard - 05/08/21 - Tolerating soft diet with robf - WOCN following - Mobilize, PT rec SNF - Pulm toilet - Path negative    FEN - Soft, IVF per TRH VTE - SCDs, okay for chemical prophylaxis from a general surgery standpoint ID - Rocephin/cefotetan periop Foley - d/c'd 7/25   ABL anemia  HTN HLD Schizophrenia BPH   LOS: 6 days    Jacinto Halim , [facility] 05/13/2021, 7:59 AM Please see Amion for pager number during day hours 7:00am-4:30pm

## 2021-06-04 ENCOUNTER — Telehealth: Payer: Self-pay | Admitting: *Deleted

## 2021-06-04 NOTE — Telephone Encounter (Signed)
Probably best to change this procedure to office visit instead to see how he is doing since his surgery.

## 2021-06-04 NOTE — Telephone Encounter (Signed)
Dr.Jacobs,  This patient was scheduled on 04/29/21 for a colonoscopy with you. He then went to the hospital had a colonoscopy on 05/06/21, then had sigmoid sx on 05/08/21. He is still on the schedule for a colonoscopy at [facility] for 07/09/21. Should he proceed with the colonoscopy as scheduled? Sorry I was unsure. Please advise. Thank you, Rogina Schiano pv

## 2021-06-04 NOTE — Telephone Encounter (Signed)
Spoke with patient's sister gave her Dr.Jacobs recommendations. Made OV 1st available afternoon with sister and cancel PV & colon-she is aware.

## 2021-06-07 ENCOUNTER — Telehealth: Payer: Self-pay | Admitting: Gastroenterology

## 2021-06-07 NOTE — Telephone Encounter (Signed)
The pt sister states that the pt had surgery on the colon and has a colostomy bag.  He has an appt to see Dr Christella Hartigan to discuss colonoscopy in October.  The pt sister would like to know if the pt can change his diet from soft foods.  I did advise that she should call the surgeon and speak with them about the diet and any possible changes.  The pt has been advised of the information and verbalized understanding.   She will have the pt keep appt as planned.

## 2021-06-07 NOTE — Telephone Encounter (Signed)
Pt's sister Gavin Pound would like to speak with you regarding pt's diet. He is scheduled for an apt with Dr. Christella Hartigan on 08/10/21 but wants to know what to do in the meantime.

## 2021-06-22 ENCOUNTER — Telehealth: Payer: Self-pay | Admitting: Gastroenterology

## 2021-06-23 NOTE — Telephone Encounter (Signed)
Left message on machine to call back  

## 2021-06-23 NOTE — Telephone Encounter (Signed)
Tried again to reach pt and no answer no voice mail on this occasion.  Will try tomorrow

## 2021-06-24 NOTE — Telephone Encounter (Signed)
Patient's sister Stanton Kidney is returning the call Tel: [phone]

## 2021-06-24 NOTE — Telephone Encounter (Signed)
Tried to reach the pt again by phone no answer and no voice mail.  Will await for further communication from the pt or family member.

## 2021-06-25 NOTE — Telephone Encounter (Signed)
Tried to reach the pt sister again and no answer voice mail is full.  Will try later

## 2021-06-28 NOTE — Telephone Encounter (Signed)
Tried again to reach the pt and no answer no voice mail will wait for the pt/pt sister to call back.

## 2021-07-07 ENCOUNTER — Ambulatory Visit: Payer: Medicare Other | Admitting: Podiatry

## 2021-07-07 ENCOUNTER — Encounter: Payer: Self-pay | Admitting: Podiatry

## 2021-07-07 ENCOUNTER — Other Ambulatory Visit: Payer: Self-pay

## 2021-07-07 DIAGNOSIS — I739 Peripheral vascular disease, unspecified: Secondary | ICD-10-CM

## 2021-07-07 DIAGNOSIS — M79675 Pain in left toe(s): Secondary | ICD-10-CM | POA: Diagnosis not present

## 2021-07-07 DIAGNOSIS — L84 Corns and callosities: Secondary | ICD-10-CM

## 2021-07-07 DIAGNOSIS — M79674 Pain in right toe(s): Secondary | ICD-10-CM | POA: Diagnosis not present

## 2021-07-07 DIAGNOSIS — B351 Tinea unguium: Secondary | ICD-10-CM | POA: Diagnosis not present

## 2021-07-09 ENCOUNTER — Encounter: Payer: Medicare Other | Admitting: Gastroenterology

## 2021-07-12 NOTE — Progress Notes (Signed)
  Subjective:  Patient ID: Brandon [name], male    DOB: Nov 07, 1945,  MRN: [id number]  75 y.o. male presents with for at risk foot care. Patient has h/o PAD and painful thick toenails that are difficult to trim. Pain interferes with ambulation. Aggravating factors include wearing enclosed shoe gear. Pain is relieved with periodic professional debridement..    Patient's sister states he was admitted to hospital with abdominal pain. He did have surgery and spent some time in rehab. He is back home now.  Patient states he is using Curel Lotion on his feet.  PCP: Karl Ito, DO.  Review of Systems: Negative except as noted in the HPI.   No Known Allergies  Objective:  There were no vitals filed for this visit. Constitutional Patient is a pleasant 75 y.o. African American male obese in NAD. AAO x 3.  Vascular Capillary fill time to digits <4 seconds.  DP/PT pulse(s) are nonpalpable b/l lower extremities. Pedal hair absent b/l. Lower extremity skin temperature gradient warm to warm. No pain with calf compression b/l. No edema noted b/l lower extremities. No cyanosis or clubbing noted. +Varicosities b/l.  Neurologic Protective sensation intact 5/5 intact bilaterally with 10g monofilament b/l. Vibratory sensation intact b/l. No clonus b/l.   Dermatologic No open wounds b/l lower extremities. No interdigital macerations b/l lower extremities. Toenails 1-5 b/l elongated, discolored, dystrophic, thickened, crumbly with subungual debris and tenderness to dorsal palpation. Hyperkeratotic lesion(s) L hallux and R hallux.  No erythema, no edema, no drainage, no fluctuance. Pedal skin noted to be dry and flaky b/l lower extremities.  Orthopedic: Muscle strength 4/5to all LE muscle groups of b/l lower extremities. Hammertoe(s) noted to the 1-5 bilaterally. Pes planus deformity noted b/l lower extremities.   Hemoglobin A1C Latest Ref Rng & Units 05/08/2021  HGBA1C 4.8 - 5.6 % 5.9(H)  Some recent data might be  hidden   Assessment:   1. Pain due to onychomycosis of toenails of both feet   2. Callus   3. PAD (peripheral artery disease) (HCC)    Plan:  Patient was evaluated and treated and all questions answered. Consent given for treatment as described below: -Examined patient. -Continue daily use of Curel Lotion.. -Patient to continue soft, supportive shoe gear daily. -Toenails 1-5 b/l were debrided in length and girth with sterile nail nippers and dremel without iatrogenic bleeding.  -Callus(es) L hallux and R hallux pared utilizing sterile scalpel blade without complication or incident. Total number debrided =2. -Patient to report any pedal injuries to medical professional immediately. -Patient/POA to call should there be question/concern in the interim.  Return in about 3 months (around 10/06/2021).  Freddie Breech, DPM

## 2021-08-10 ENCOUNTER — Ambulatory Visit: Payer: Medicare Other | Admitting: Gastroenterology

## 2021-08-18 ENCOUNTER — Telehealth: Payer: Self-pay | Admitting: *Deleted

## 2021-08-18 ENCOUNTER — Ambulatory Visit: Payer: Self-pay | Admitting: Surgery

## 2021-08-18 ENCOUNTER — Ambulatory Visit: Payer: Medicare Other | Admitting: Gastroenterology

## 2021-08-18 NOTE — Telephone Encounter (Signed)
   Pre-operative Risk Assessment    Patient Name: Brandon [name]  DOB: [birth year] MRN: [id number]      Request for Surgical Clearance   Procedure:   COLOSTOMY TAKEDOWN  Date of Surgery: Clearance TBD                                 Surgeon:  DR. Karie Soda Surgeon's Group or Practice Name:  [facility] Phone number:  [phone] Fax number:  [phone] ATTN: Ethlyn Gallery, CMA   Type of Clearance Requested: - Medical  - Pharmacy:  Hold Aspirin     Type of Anesthesia:   General    Additional requests/questions:   Elpidio Anis   08/18/2021, 5:16 PM

## 2021-08-19 NOTE — Telephone Encounter (Signed)
Left VM

## 2021-08-19 NOTE — Telephone Encounter (Signed)
PT Returning your phone call...  Callback [phone]

## 2021-08-23 NOTE — Telephone Encounter (Signed)
   Name: Brandon [name]  DOB: 02/12/1946  MRN: [id number]  Primary Cardiologist: Meriam Sprague, MD  Chart reviewed as part of pre-operative protocol coverage. Because of Brandon [name] past medical history and time since last visit, he will require a follow-up visit in order to better assess preoperative cardiovascular risk.  Pre-op covering staff: - Please schedule appointment and call patient to inform them. If patient already had an upcoming appointment within acceptable timeframe, please add "pre-op clearance" to the appointment notes so provider is aware. - Please contact requesting surgeon's office via preferred method (i.e, phone, fax) to inform them of need for appointment prior to surgery.  If applicable, this message will also be routed to pharmacy pool and/or primary cardiologist for input on holding anticoagulant/antiplatelet agent as requested below so that this information is available to the clearing provider at time of patient's appointment.   Azalee Course, [place]  08/23/2021, 5:36 PM

## 2021-08-24 NOTE — Telephone Encounter (Signed)
Spoke with pt's sister, Gavin Pound, [place] on file.  She has been made aware that the pt will need to be seen in order to be cleared for his upcoming procedure. She will have to talk with her sister, who will be doing the driving, and call back.  Have offered couple appointments in [employer] with Nena Polio, 11/15.  When she calls back, please schedule.

## 2021-08-24 NOTE — Telephone Encounter (Signed)
Pt has been scheduled to see Tereso Newcomer, PA-C, 08/27/2021, and clearance will be addressed at that time.  Will route back to the requesting surgeon's office to make them aware.

## 2021-08-26 DIAGNOSIS — N1831 Chronic kidney disease, stage 3a: Secondary | ICD-10-CM | POA: Insufficient documentation

## 2021-08-26 DIAGNOSIS — Z01810 Encounter for preprocedural cardiovascular examination: Secondary | ICD-10-CM | POA: Insufficient documentation

## 2021-08-26 NOTE — Progress Notes (Signed)
Cardiology Office Note:    Date:  08/27/2021   ID:  Brandon [name], DOB [birth year], MRN [id number]  PCP:  [name], DO   [facility] Providers Cardiologist:  [name], MD     Referring MD: [name], DO   Chief Complaint:  surgical clearance    Patient Profile:   Brandon [name] is a 75 y.o. 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   He returns for surgical clearance.  He needs to undergo colostomy takedown with Dr. Johney Maine under general anesthesia.  It has been requested that his aspirin be held for the procedure.  He is here with his sister.  He is fairly sedentary and walks with a walker.  He has not had chest pain, significant shortness of breath.  He does have chronic leg edema.  He has not had syncope.    ASSESSMENT & PLAN:   Preoperative cardiovascular examination [name]'s perioperative risk of a major cardiac event is 0.9% according to the Revised Cardiac Risk Index (RCRI).  Therefore, he is at low risk for perioperative complications.   His functional capacity is poor at 3.3 METs according to the Duke Activity Status Index (DASI). Recommendations: According to ACC/AHA guidelines, no further cardiovascular testing needed.  The patient may proceed to surgery at acceptable risk.   Antiplatelet and/or Anticoagulation Recommendations: Aspirin can be held for 7 days prior to his surgery.  Please resume Aspirin post operatively when it is felt to be safe from a bleeding standpoint.   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  I reviewed his records and he has HRs > 100 going back 2+ years.  He is asymptomatic.  I reviewed this with Dr. Rayann Heman (attending MD). The patient has a hx of normal EF on echocardiogram in  Dec 2021 and a low risk Myoview.  No further cardiac testing is needed.  He should f/u with primary care to make sure his TSH has been checked.    Essential hypertension BP is controlled on Losartan.  Chronic venous insufficiency Continue compression, elevation and prn Furosemide.    Stage 3a chronic kidney disease ([place]) Most recent Creatinine was normal.  He is followed by nephrology.         Dispo:  Return in about 6 months (around 02/24/2022) for Routine follow up in 6 months with Dr. Johney Frame. .    Prior CV studies: Myoview 10/07/20 EF 59, no ischemia or infarction, low risk    Echocardiogram  10/07/20 EF 60-65, no RWMA, Gr 1 DD, GLS -18.0%, normal RVSF, trivial MR    Past Medical History:  Diagnosis Date   Allergy    Arthritis    Bilateral knee swelling    Glaucoma    Hypertension    Prostate disorder    Schizophrenia ([place])    Weakness of lower extremity    left knee, uses walker   Current Medications: Current Meds  Medication Sig   aspirin EC 81 MG tablet Take 81 mg by mouth daily.   buPROPion (WELLBUTRIN SR) 150 MG 12 hr tablet Take 150 mg by mouth daily.   finasteride (PROSCAR) 5 MG tablet Take 5 mg by mouth daily.   fluticasone (FLONASE) 50 MCG/ACT nasal spray Place 1-2 sprays into both nostrils daily as  needed for allergies.   losartan (COZAAR) 50 MG tablet Take 50 mg by mouth daily.   omeprazole (PRILOSEC) 20 MG capsule Take 20 mg by mouth daily.   oxybutynin (DITROPAN-XL) 5 MG 24 hr tablet Take 5 mg by mouth daily.   rosuvastatin (CRESTOR) 5 MG tablet Take 5 mg by mouth at bedtime.   tamsulosin (FLOMAX) 0.4 MG CAPS capsule Take 0.4 mg by mouth daily.   Travoprost, BAK Free, (TRAVATAN) 0.004 % SOLN ophthalmic solution Place 1 drop into both eyes at bedtime.   trifluoperazine (STELAZINE) 5 MG tablet Take 5 mg by mouth at bedtime.    Allergies:   Patient has no known allergies.   Social History   Tobacco Use   Smoking status: Never   Smokeless tobacco:  Never  Substance Use Topics   Alcohol use: No   Drug use: No    Family Hx: The patient's family history includes Breast cancer in his father and mother; Kidney disease in his mother; Leukemia in his father.  ROS see HPI  EKGs/Labs/Other Test Reviewed:    EKG:  EKG is  ordered today.  The ekg ordered today demonstrates sinus tachy, HR 129, normal axis, non-specific ST-TW changes, QTc 454 ms, no change from prior tracing   Recent Labs: 05/07/2021: ALT 35 05/08/2021: Magnesium 2.1 05/12/2021: BUN 18; Creatinine, Ser 0.92; Hemoglobin 10.9; Platelets 181; Potassium 3.8; Sodium 137   Recent Lipid Panel No results found for: CHOL, TRIG, HDL, LDLCALC, LDLDIRECT   Risk Assessment/Calculations:          Physical Exam:    VS:  BP 110/60   Pulse (!) 129   Ht 6' (1.829 m)   Wt 202 lb 9.6 oz (91.9 kg)   SpO2 98%   BMI 27.48 kg/m     Wt Readings from Last 3 Encounters:  08/27/21 202 lb 9.6 oz (91.9 kg)  05/07/21 220 lb 0.3 oz (99.8 kg)  10/27/20 210 lb (95.3 kg)    Constitutional:      Appearance: Healthy appearance. Not in distress.  Neck:     Vascular: No JVR.  Pulmonary:     Effort: Pulmonary effort is normal.     Breath sounds: No wheezing. No rales.  Cardiovascular:     Tachycardia present. Regular rhythm. Normal S1. Normal S2.   Edema:    Peripheral edema present.    Pretibial: bilateral 2+ edema of the pretibial area. Abdominal:     Palpations: Abdomen is soft.  Skin:    General: Skin is warm and dry.  Neurological:     General: No focal deficit present.     Mental Status: Alert and oriented to person, place and time.     Cranial Nerves: Cranial nerves are intact.       Medication Adjustments/Labs and Tests Ordered: Current medicines are reviewed at length with the patient today.  Concerns regarding medicines are outlined above.  Tests Ordered: Orders Placed This Encounter  Procedures   EKG 12-Lead   Medication Changes: No orders of the defined types were  placed in this encounter.  Signed, Tereso Newcomer, PA-C  08/27/2021 2:34 PM    [facility] [address] [place], [place], [place]  [address] Phone: [phone]; Fax: [phone]

## 2021-08-27 ENCOUNTER — Encounter: Payer: Self-pay | Admitting: Physician Assistant

## 2021-08-27 ENCOUNTER — Other Ambulatory Visit: Payer: Self-pay

## 2021-08-27 ENCOUNTER — Ambulatory Visit (INDEPENDENT_AMBULATORY_CARE_PROVIDER_SITE_OTHER): Payer: Medicare Other | Admitting: Physician Assistant

## 2021-08-27 VITALS — BP 110/60 | HR 129 | Ht 72.0 in | Wt 202.6 lb

## 2021-08-27 DIAGNOSIS — R000 Tachycardia, unspecified: Secondary | ICD-10-CM

## 2021-08-27 DIAGNOSIS — N1831 Chronic kidney disease, stage 3a: Secondary | ICD-10-CM

## 2021-08-27 DIAGNOSIS — Z01810 Encounter for preprocedural cardiovascular examination: Secondary | ICD-10-CM

## 2021-08-27 DIAGNOSIS — I872 Venous insufficiency (chronic) (peripheral): Secondary | ICD-10-CM | POA: Diagnosis not present

## 2021-08-27 DIAGNOSIS — I10 Essential (primary) hypertension: Secondary | ICD-10-CM

## 2021-08-27 NOTE — Assessment & Plan Note (Addendum)
Mr. Kirwan perioperative risk of a major cardiac event is 0.9% according to the Revised Cardiac Risk Index (RCRI).  Therefore, he is at low risk for perioperative complications.   His functional capacity is poor at 3.3 METs according to the Duke Activity Status Index (DASI). Recommendations: According to ACC/AHA guidelines, no further cardiovascular testing needed.  The patient may proceed to surgery at acceptable risk.   Antiplatelet and/or Anticoagulation Recommendations: Aspirin can be held for 7 days prior to his surgery.  Please resume Aspirin post operatively when it is felt to be safe from a bleeding standpoint.

## 2021-08-27 NOTE — Assessment & Plan Note (Signed)
He has a long hx of this.  I reviewed his records and he has HRs > 100 going back 2+ years.  He is asymptomatic.  I reviewed this with Dr. Johney Frame (attending MD). The patient has a hx of normal EF on echocardiogram in Dec 2021 and a low risk Myoview.  No further cardiac testing is needed.  He should f/u with primary care to make sure his TSH has been checked.

## 2021-08-27 NOTE — Assessment & Plan Note (Signed)
Most recent Creatinine was normal.  He is followed by nephrology.

## 2021-08-27 NOTE — Patient Instructions (Signed)
Medication Instructions:   Your physician recommends that you continue on your current medications as directed. Please refer to the Current Medication list given to you today.  *If you need a refill on your cardiac medications before your next appointment, please call your pharmacy*   Lab Work:  -NONE  If you have labs (blood work) drawn today and your tests are completely normal, you will receive your results only by: MyChart Message (if you have MyChart) OR A paper copy in the mail If you have any lab test that is abnormal or we need to change your treatment, we will call you to review the results.   Testing/Procedures:  -NONE   Follow-Up: At [facility], you and your health needs are our priority.  As part of our continuing mission to provide you with exceptional heart care, we have created designated Provider Care Teams.  These Care Teams include your primary Cardiologist (physician) and Advanced Practice Providers (APPs -  Physician Assistants and Nurse Practitioners) who all work together to provide you with the care you need, when you need it.  We recommend signing up for the patient portal called "MyChart".  Sign up information is provided on this After Visit Summary.  MyChart is used to connect with patients for Virtual Visits (Telemedicine).  Patients are able to view lab/test results, encounter notes, upcoming appointments, etc.  Non-urgent messages can be sent to your provider as well.   To learn more about what you can do with MyChart, go to [email or web address].    Your next appointment:   6 month(s)  The format for your next appointment:   In Person  Provider:   Meriam Sprague, MD     Other Instructions  Your physician wants you to follow-up in: 6 months with Dr. Shari Prows.  You will receive a reminder letter in the mail two months in advance. If you don't receive a letter, please call our office to schedule the follow-up appointment.

## 2021-08-27 NOTE — Assessment & Plan Note (Signed)
Continue compression, elevation and prn Furosemide.

## 2021-08-27 NOTE — Assessment & Plan Note (Signed)
BP is controlled on Losartan.

## 2021-09-01 ENCOUNTER — Telehealth: Payer: Self-pay | Admitting: Gastroenterology

## 2021-09-01 NOTE — Telephone Encounter (Addendum)
Inbound call from [facility] with [facility] requesting a call from a nurse please.  States patient is scheduled to have surgery with them on 09/23/21 and will need to have a colonoscopy done the day before which will be 09/22/21.  Can be reached at [phone].

## 2021-09-02 ENCOUNTER — Telehealth: Payer: Self-pay

## 2021-09-02 NOTE — Telephone Encounter (Signed)
-----   Message from Rachael Fee, MD sent at 09/02/2021  2:29 PM EST ----- No, he should keep that appt and we can educate him about his colonoscopy from there.  You can cancel his previsit instead.   Thanks  ----- Message ----- From: Antony Blackbird, RN Sent: 09/02/2021   1:53 PM EST To: Rachael Fee, MD  Dr. Gerilyn Pilgrim, Would you like me to cancel the office visit with you for this patient on 09/15/21 ? Thanks  [facility]

## 2021-09-02 NOTE — Telephone Encounter (Signed)
See other phone note

## 2021-09-02 NOTE — Telephone Encounter (Signed)
Cancelled Pre-Visit (per Dr. Christella Hartigan), called patient's sister and let her know

## 2021-09-02 NOTE — Telephone Encounter (Signed)
Called back to [place] w/[facility], got voice mail. Left another message to please call back again

## 2021-09-02 NOTE — Telephone Encounter (Signed)
Noelle returned your phone call.  Please call her at [phone].  Thank you.

## 2021-09-02 NOTE — Telephone Encounter (Signed)
Called Noelle w/[facility] back and left a message on her voice mail that we were able to schedule the patient's colonoscopy on 09/22/21 and that he has been notified

## 2021-09-02 NOTE — Telephone Encounter (Signed)
Spoke to [facility] w/[facility] and Dr. Michaell Cowing is doing surgery on this patient on 09/23/21 "ostomy take-down, lysis of adhesions, ridged proctoscopy" and is requesting Dr. Christella Hartigan do a colonoscopy the day before. Altamease Oiler is not sure of the reason why it needs to be done the day before ? Only opening in the [facility] on 09/22/21 is with Dr. Meridee Score or Dr. Leonides Schanz. Please advise.

## 2021-09-02 NOTE — Telephone Encounter (Signed)
Inbound call from patient's sister requesting to speak with a nurse in regards to patient's upcoming procedure.  Please and thank you.

## 2021-09-02 NOTE — Telephone Encounter (Signed)
Scheduled colonoscopy on 09/22/21 with Dr. Meridee Score in the [facility] Also scheduled Pre-Visit on 09/16/21, patient to arrive at 3:45 pm. Called patient and spoke to his sister, Jovita Gamma all the above information and she wrote it down

## 2021-09-02 NOTE — Telephone Encounter (Signed)
Returned Brandon [name]'s phone call (with [facility]). Got her voice mail, left message to please call back.

## 2021-09-15 ENCOUNTER — Ambulatory Visit (INDEPENDENT_AMBULATORY_CARE_PROVIDER_SITE_OTHER): Payer: Medicare Other | Admitting: Gastroenterology

## 2021-09-15 ENCOUNTER — Encounter: Payer: Self-pay | Admitting: Gastroenterology

## 2021-09-15 ENCOUNTER — Other Ambulatory Visit (HOSPITAL_COMMUNITY): Payer: Self-pay

## 2021-09-15 VITALS — BP 126/70 | HR 82

## 2021-09-15 DIAGNOSIS — K562 Volvulus: Secondary | ICD-10-CM

## 2021-09-15 MED ORDER — NA SULFATE-K SULFATE-MG SULF 17.5-3.13-1.6 GM/177ML PO SOLN: 1.0000 | ORAL | 0 refills | Status: DC

## 2021-09-15 NOTE — Patient Instructions (Signed)
If you are age 73 or older, your body mass index should be between 23-30. Your There is no height or weight on file to calculate BMI. If this is out of the aforementioned range listed, please consider follow up with your Primary Care Provider. ________________________________________________________  The  GI providers would like to encourage you to use [facility] to communicate with providers for non-urgent requests or questions.  Due to long hold times on the telephone, sending your provider a message by [facility] may be a faster and more efficient way to get a response.  Please allow 48 business hours for a response.  Please remember that this is for non-urgent requests.  _______________________________________________________  Bonita Quin have been scheduled for a colonoscopy. Please follow written instructions given to you at your visit today.  Please pick up your prep supplies at the pharmacy within the next 1-3 days. If you use inhalers (even only as needed), please bring them with you on the day of your procedure.  Due to recent changes in healthcare laws, you may see the results of your imaging and laboratory studies on MyChart before your provider has had a chance to review them.  We understand that in some cases there may be results that are confusing or concerning to you. Not all laboratory results come back in the same time frame and the provider may be waiting for multiple results in order to interpret others.  Please give [place] 48 hours in order for your provider to thoroughly review all the results before contacting the office for clarification of your results.   Thank you for entrusting me with your care and choosing [facility].  Dr Christella Hartigan

## 2021-09-15 NOTE — Progress Notes (Signed)
HPI: This is a very pleasant 75 year old man  Is here with his 2 sisters.  He is sitting in a wheelchair for this visit  I last saw him about 4 5 months ago when he was hospitalized at [facility] with a sigmoid volvulus.  I was able to reduce the volvulus with a flexible sigmoidoscopy however it recurred within 12 hours.  He underwent a repeat flexible sigmoidoscopy by Dr. Meridee Score which decompress the volvulus again and then he underwent surgical resection and end colostomy placement by Dr. Carolynne Edouard.  He has never had a full colonoscopy.  There are surgical plans to take down his ostomy in early December and the surgeons requested a preoperative colonoscopy the day prior to his surgery in order to check for other potential problems in his colon.  I do not have endoscopy time on that day however Dr. Meridee Score graciously agreed to help with the colonoscopy on December 7 in our outpatient endoscopy center.  He walks with a cane at home and he and his sisters are very clear that he is able to self transfer in and out of beds.  He is quite eager to have his colostomy reversed  ROS: complete GI ROS as described in HPI, all other review negative.  Constitutional:  No unintentional weight loss   Past Medical History:  Diagnosis Date   Allergy    Arthritis    Bilateral knee swelling    Glaucoma    Hypertension    Prostate disorder    Schizophrenia (HCC)    Weakness of lower extremity    left knee, uses walker    Past Surgical History:  Procedure Laterality Date   ANKLE SURGERY  1997   had broken right ankle/had 3 surgeries in 1996/ has pins and rods   BOWEL DECOMPRESSION N/A 05/07/2021   Procedure: BOWEL DECOMPRESSION;  Surgeon: Lemar Lofty., MD;  Location: [facility];  Service: Gastroenterology;  Laterality: N/A;   BOWEL DECOMPRESSION N/A 05/06/2021   Procedure: BOWEL DECOMPRESSION;  Surgeon: Rachael Fee, MD;  Location: [facility];  Service: Endoscopy;   Laterality: N/A;   COLONOSCOPY WITH PROPOFOL N/A 05/06/2021   Procedure: COLONOSCOPY WITH PROPOFOL;  Surgeon: Rachael Fee, MD;  Location: [facility];  Service: Endoscopy;  Laterality: N/A;   FLEXIBLE SIGMOIDOSCOPY N/A 05/07/2021   Procedure: FLEXIBLE SIGMOIDOSCOPY;  Surgeon: Meridee Score Netty Starring., MD;  Location: [facility];  Service: Gastroenterology;  Laterality: N/A;   LAPAROTOMY N/A 05/08/2021   Procedure: EXPLORATORY LAPAROTOMY, SIGMOID COLECTOMY, COLOSTOMY;  Surgeon: Griselda Miner, MD;  Location: [facility];  Service: General;  Laterality: N/A;    Current Outpatient Medications  Medication Sig Dispense Refill   acetaminophen (TYLENOL) 650 MG CR tablet Take 650 mg by mouth every 8 (eight) hours as needed for pain.     aspirin EC 81 MG tablet Take 81 mg by mouth daily.     buPROPion (WELLBUTRIN SR) 150 MG 12 hr tablet Take 150 mg by mouth daily.     Cholecalciferol (VITAMIN D3) 25 MCG (1000 UT) CAPS Take 1 capsule by mouth daily.     Cod Liver Oil 1000 MG CAPS Take 1 capsule by mouth daily.     finasteride (PROSCAR) 5 MG tablet Take 5 mg by mouth daily.     losartan (COZAAR) 50 MG tablet Take 50 mg by mouth daily.     omeprazole (PRILOSEC) 20 MG capsule Take 20 mg by mouth daily.     oxybutynin (DITROPAN-XL) 5  MG 24 hr tablet Take 5 mg by mouth daily.     rosuvastatin (CRESTOR) 5 MG tablet Take 5 mg by mouth at bedtime.     tamsulosin (FLOMAX) 0.4 MG CAPS capsule Take 0.4 mg by mouth daily.     Travoprost, BAK Free, (TRAVATAN) 0.004 % SOLN ophthalmic solution Place 1 drop into both eyes at bedtime.     trifluoperazine (STELAZINE) 5 MG tablet Take 5 mg by mouth at bedtime.     No current facility-administered medications for this visit.    Allergies as of 09/15/2021   (No Known Allergies)    Family History  Problem Relation Age of Onset   Kidney disease Mother    Breast cancer Mother    Breast cancer Father    Leukemia Father     Social History   Socioeconomic History    Marital status: Single    Spouse name: Not on file   Number of children: Not on file   Years of education: Not on file   Highest education level: Not on file  Occupational History   Not on file  Tobacco Use   Smoking status: Never   Smokeless tobacco: Never  Substance and Sexual Activity   Alcohol use: No   Drug use: No   Sexual activity: Not on file  Other Topics Concern   Not on file  Social History Narrative   Not on file   Social Determinants of Health   Financial Resource Strain: Not on file  Food Insecurity: Not on file  Transportation Needs: Not on file  Physical Activity: Not on file  Stress: Not on file  Social Connections: Not on file  Intimate Partner Violence: Not on file     Physical Exam: BP 126/70   Pulse 82  Constitutional: generally well-appearing, sitting in a wheelchair Psychiatric: alert and oriented x3 Abdomen: soft, nontender, nondistended, no obvious ascites, no peritoneal signs, normal bowel sounds: Left-sided end colostomy was pink, there was a small amount of stool and gas in the bag No peripheral edema noted in lower extremities  Assessment and plan: 75 y.o. male with recent sigmoid resection for recurrent sigmoid volvulus  He is scheduled for colostomy takedown next week on December 8.  We are planning for a colonoscopy the day prior in our outpatient endoscopy center.  I discussed the importance with him and his sisters about him being absolutely religious with the prep instructions to ensure he is able to have the examination in time for his surgery the next day.  I do not think he needs evaluation per anus since that have been done twice over the summer, rather he probably just needs colonoscopy from the left-sided ostomy site to the cecum.  I am not performing procedures the day prior to his planned surgery and Dr. Meridee Score graciously agreed to help with this.  Please see the "Patient Instructions" section for addition details about  the plan.  Rob Bunting, MD [facility] 09/15/2021, 11:36 AM   Total time on date of encounter was 30 minutes (this included time spent preparing to see the patient reviewing records; obtaining and/or reviewing separately obtained history; performing a medically appropriate exam and/or evaluation; counseling and educating the patient and family if present; ordering medications, tests or procedures if applicable; and documenting clinical information in the health record).

## 2021-09-16 ENCOUNTER — Other Ambulatory Visit (HOSPITAL_COMMUNITY): Payer: Medicare Other

## 2021-09-16 NOTE — Patient Instructions (Addendum)
DUE TO COVID-19 ONLY ONE VISITOR IS ALLOWED TO COME WITH YOU AND STAY IN THE WAITING ROOM ONLY DURING PRE OP AND PROCEDURE DAY OF SURGERY IF YOU ARE GOING HOME AFTER SURGERY. IF YOU ARE SPENDING THE NIGHT 2 PEOPLE MAY VISIT WITH YOU IN YOUR PRIVATE ROOM AFTER SURGERY UNTIL VISITING  HOURS ARE OVER AT 800 PM AND 1  VISITOR  MAY  SPEND THE NIGHT.   YOU NEED TO HAVE A COVID 19 TEST ON__12/6___THIS TEST MUST BE DONE BEFORE SURGERY,  COVID TESTING SITE  IS LOCATED AT [address], [place]. REMAIN IN YOUR CAR THIS IS A DRIVE UP TEST. AFTER YOUR COVID TEST PLEASE WEAR A MASK OUT IN PUBLIC AND SOCIAL DISTANCE AND WASH YOUR HANDS FREQUENTLY, ALSO ASK ALL YOUR CLOSE CONTACT PERSONS TO WEAR A MASK AND SOCIAL DISTANCE AND WASH THEIR HANDS FREQUENTLY ALSO.               Brandon [name]     Your procedure is scheduled on: 09/23/21   Report to [facility] Main  Entrance   Report to admitting at   10:30 AM     Call this number if you have problems the morning of surgery [phone]   Follow all instructions for diet and bowel prep from Dr. Gordy Savers office.  Drink plenty of fluids on prep day to prevent dehydration.   Remember: Do not eat food  :After Midnight the night before your surgery,     You may have clear liquids from midnight until 9:30 am.  DRINK 2 PRESURGERY ENSURE DRINKS THE NIGHT BEFORE SURGERY AT 10:00 PM.  Drink the last ensure clear at 9:30 AM then nothing more by mouth.   CLEAR LIQUID DIET   Foods Allowed                                                                     Foods Excluded  Coffee and tea, regular and decaf                             liquids that you cannot  Plain Jell-O any favor except red or purple                                           see through such as: Fruit ices (not with fruit pulp)                                     milk, soups, orange juice  Iced Popsicles                                    All solid food Carbonated  beverages, regular and diet                                    Cranberry, grape and apple  juices Sports drinks like Gatorade Lightly seasoned clear broth or consume(fat free) Sugar     BRUSH YOUR TEETH MORNING OF SURGERY AND RINSE YOUR MOUTH OUT, NO CHEWING GUM CANDY OR MINTS.     Take these medicines the morning of surgery with A SIP OF WATER: Wellbutrin, Tamsulosin, Prilosec, Oxybutynin                                You may not have any metal on your body including              piercings  Do not wear jewelry,  lotions, powders or  deodorant             Men may shave face and neck.   Do not bring valuables to the hospital. [facility] IS NOT             RESPONSIBLE   FOR VALUABLES.  Contacts, dentures or bridgework may not be worn into surgery.  Leave suitcase in the car. After surgery it may be brought to your room.                  [facility] - Preparing for Surgery Before surgery, you can play an important role.  Because skin is not sterile, your skin needs to be as free of germs as possible.  You can reduce the number of germs on your skin by washing with CHG (chlorahexidine gluconate) soap before surgery.  CHG is an antiseptic cleaner which kills germs and bonds with the skin to continue killing germs even after washing. Please DO NOT use if you have an allergy to CHG or antibacterial soaps.  If your skin becomes reddened/irritated stop using the CHG and inform your nurse when you arrive at Short Stay.  You may shave your face/neck. Please follow these instructions carefully:  1.  Shower with CHG Soap the night before surgery and the  morning of Surgery.  2.  If you choose to wash your hair, wash your hair first as usual with your  normal  shampoo.  3.  After you shampoo, rinse your hair and body thoroughly to remove the  shampoo.                            4.  Use CHG as you would any other liquid soap.  You can apply chg directly  to the skin and wash                        Gently with a scrungie or clean washcloth.  5.  Apply the CHG Soap to your body ONLY FROM THE NECK DOWN.   Do not use on face/ open                           Wound or open sores. Avoid contact with eyes, ears mouth and genitals (private parts).                       Wash face,  Genitals (private parts) with your normal soap.             6.  Wash thoroughly, paying special attention to the area where your surgery  will be performed.  7.  Thoroughly rinse your body with warm water from the  neck down.  8.  DO NOT shower/wash with your normal soap after using and rinsing off  the CHG Soap.                9.  Pat yourself dry with a clean towel.            10.  Wear clean pajamas.            11.  Place clean sheets on your bed the night of your first shower and do not  sleep with pets. Day of Surgery : Do not apply any lotions/deodorants the morning of surgery.  Please wear clean clothes to the hospital/surgery center.  FAILURE TO FOLLOW THESE INSTRUCTIONS MAY RESULT IN THE CANCELLATION OF YOUR SURGERY PATIENT SIGNATURE_________________________________  NURSE SIGNATURE__________________________________  ________________________________________________________________________   Brandon [name]  An incentive spirometer is a tool that can help keep your lungs clear and active. This tool measures how well you are filling your lungs with each breath. Taking long deep breaths may help reverse or decrease the chance of developing breathing (pulmonary) problems (especially infection) following: A long period of time when you are unable to move or be active. BEFORE THE PROCEDURE  If the spirometer includes an indicator to show your best effort, your nurse or respiratory therapist will set it to a desired goal. If possible, sit up straight or lean slightly forward. Try not to slouch. Hold the incentive spirometer in an upright position. INSTRUCTIONS FOR USE  Sit on the edge of your bed if  possible, or sit up as far as you can in bed or on a chair. Hold the incentive spirometer in an upright position. Breathe out normally. Place the mouthpiece in your mouth and seal your lips tightly around it. Breathe in slowly and as deeply as possible, raising the piston or the ball toward the top of the column. Hold your breath for 3-5 seconds or for as long as possible. Allow the piston or ball to fall to the bottom of the column. Remove the mouthpiece from your mouth and breathe out normally. Rest for a few seconds and repeat Steps 1 through 7 at least 10 times every 1-2 hours when you are awake. Take your time and take a few normal breaths between deep breaths. The spirometer may include an indicator to show your best effort. Use the indicator as a goal to work toward during each repetition. After each set of 10 deep breaths, practice coughing to be sure your lungs are clear. If you have an incision (the cut made at the time of surgery), support your incision when coughing by placing a pillow or rolled up towels firmly against it. Once you are able to get out of bed, walk around indoors and cough well. You may stop using the incentive spirometer when instructed by your caregiver.  RISKS AND COMPLICATIONS Take your time so you do not get dizzy or light-headed. If you are in pain, you may need to take or ask for pain medication before doing incentive spirometry. It is harder to take a deep breath if you are having pain. AFTER USE Rest and breathe slowly and easily. It can be helpful to keep track of a log of your progress. Your caregiver can provide you with a simple table to help with this. If you are using the spirometer at home, follow these instructions: SEEK MEDICAL CARE IF:  You are having difficultly using the spirometer. You have trouble using the spirometer as often as instructed.  Your pain medication is not giving enough relief while using the spirometer. You develop fever of  100.5 F (38.1 C) or higher. SEEK IMMEDIATE MEDICAL CARE IF:  You cough up bloody sputum that had not been present before. You develop fever of 102 F (38.9 C) or greater. You develop worsening pain at or near the incision site. MAKE SURE YOU:  Understand these instructions. Will watch your condition. Will get help right away if you are not doing well or get worse. Document Released: 02/13/2007 Document Revised: 12/26/2011 Document Reviewed: 04/16/2007 [facility] Patient Information 2014 [place], [place].   ________________________________________________________________________

## 2021-09-17 ENCOUNTER — Encounter (HOSPITAL_COMMUNITY): Payer: Self-pay

## 2021-09-17 ENCOUNTER — Encounter (HOSPITAL_COMMUNITY)
Admission: RE | Admit: 2021-09-17 | Discharge: 2021-09-17 | Disposition: A | Discharge status: Home / Self Care | Payer: Medicare Other | Source: Ambulatory Visit | Attending: Surgery | Admitting: Surgery

## 2021-09-17 ENCOUNTER — Other Ambulatory Visit: Payer: Self-pay

## 2021-09-17 VITALS — BP 153/88 | HR 100 | Temp 98.6°F | Resp 20 | Ht 72.0 in | Wt 200.0 lb

## 2021-09-17 DIAGNOSIS — Z01812 Encounter for preprocedural laboratory examination: Secondary | ICD-10-CM | POA: Diagnosis not present

## 2021-09-17 DIAGNOSIS — Z79899 Other long term (current) drug therapy: Secondary | ICD-10-CM | POA: Insufficient documentation

## 2021-09-17 DIAGNOSIS — I10 Essential (primary) hypertension: Secondary | ICD-10-CM

## 2021-09-17 LAB — CBC
HCT: 34.6 % — ABNORMAL LOW (ref 39.0–52.0)
Hemoglobin: 10.7 g/dL — ABNORMAL LOW (ref 13.0–17.0)
MCH: 27.1 pg (ref 26.0–34.0)
MCHC: 30.9 g/dL (ref 30.0–36.0)
MCV: 87.6 fL (ref 80.0–100.0)
Platelets: 199 10*3/uL (ref 150–400)
RBC: 3.95 MIL/uL — ABNORMAL LOW (ref 4.22–5.81)
RDW: 15.6 % — ABNORMAL HIGH (ref 11.5–15.5)
WBC: 7.3 10*3/uL (ref 4.0–10.5)
nRBC: 0 % (ref 0.0–0.2)

## 2021-09-17 LAB — BASIC METABOLIC PANEL
Anion gap: 7 (ref 5–15)
BUN: 21 mg/dL (ref 8–23)
CO2: 26 mmol/L (ref 22–32)
Calcium: 9.2 mg/dL (ref 8.9–10.3)
Chloride: 103 mmol/L (ref 98–111)
Creatinine, Ser: 1.33 mg/dL — ABNORMAL HIGH (ref 0.61–1.24)
GFR, Estimated: 56 mL/min — ABNORMAL LOW (ref >60)
Glucose, Bld: 103 mg/dL — ABNORMAL HIGH (ref 70–99)
Potassium: 4.5 mmol/L (ref 3.5–5.1)
Sodium: 136 mmol/L (ref 135–145)

## 2021-09-17 NOTE — Progress Notes (Signed)
COVID test- 09/21/21 paper work given to sister and reviewed   PCP - Dr. Celene Skeen Cardiologist - Dr. Jon Billings  Chest x-ray -  EKG - 111/11/22-Epic Stress Test - 10/07/20-epic ECHO - 10/07/20-epic Cardiac Cath - no Pacemaker/ICD device last checked:NA  Sleep Study - no CPAP -   Fasting Blood Sugar - NA Checks Blood Sugar _____ times a day  Blood Thinner Instructions:ASA 81 mg / Dr. Allena Katz Aspirin Instructions:Stop 7 days prior to DOS/ Dr. Michaell Cowing Last Dose:09/15/21  Anesthesia review: no  Patient denies shortness of breath, fever, cough and chest pain at PAT appointment Pt has no SOB with any activities. Pt has a difficult time standing. He uses a cane or a walker  Patient verbalized understanding of instructions that were given to them at the PAT appointment. Patient was also instructed that they will need to review over the PAT instructions again at home before surgery. Yes Instructions were reviewed with the Pt. Then reviewed with his sister and a family friend. They were having trouble keeping it straight and I told them to call if they had questions.

## 2021-09-20 ENCOUNTER — Telehealth: Payer: Self-pay

## 2021-09-20 NOTE — Telephone Encounter (Signed)
The pt sister called to report that Suprep is not covered by his insurance.  I did explain that we have no coupon or rebate offers at this time. She did agree to pay the copay and use the suprep due to the low volume of prep needed. We also discussed what the pt can have to eat/drink today.  He will be NPO except for clear liquids after midnight until 4 hours prior to the procedure appt time.   She will call back if she has any further prep questions.

## 2021-09-21 ENCOUNTER — Other Ambulatory Visit: Payer: Self-pay

## 2021-09-22 ENCOUNTER — Ambulatory Visit (AMBULATORY_SURGERY_CENTER): Payer: Medicare Other | Admitting: Gastroenterology

## 2021-09-22 ENCOUNTER — Encounter: Payer: Self-pay | Admitting: Gastroenterology

## 2021-09-22 ENCOUNTER — Other Ambulatory Visit: Payer: Self-pay

## 2021-09-22 VITALS — BP 136/94 | HR 92 | Temp 98.9°F | Resp 16 | Ht 72.0 in | Wt 209.0 lb

## 2021-09-22 DIAGNOSIS — Z1211 Encounter for screening for malignant neoplasm of colon: Secondary | ICD-10-CM

## 2021-09-22 DIAGNOSIS — K6389 Other specified diseases of intestine: Secondary | ICD-10-CM

## 2021-09-22 DIAGNOSIS — K562 Volvulus: Secondary | ICD-10-CM | POA: Diagnosis not present

## 2021-09-22 LAB — SARS CORONAVIRUS 2 (TAT 6-24 HRS): SARS Coronavirus 2: NEGATIVE

## 2021-09-22 MED ORDER — SODIUM CHLORIDE 0.9 % IV SOLN: 500.0000 mL | Freq: Once | INTRAVENOUS | Status: DC

## 2021-09-22 NOTE — Progress Notes (Signed)
GASTROENTEROLOGY PROCEDURE H&P NOTE   Primary Care Physician: [name], DO  HPI: Brandon [name] is a 75 y.o. 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  Past Medical History:  Diagnosis Date   Allergy    Arthritis    Bilateral knee swelling    Glaucoma    Hypertension    Prostate disorder    Schizophrenia ([place])    Weakness of lower extremity    left knee, uses walker   Past Surgical History:  Procedure Laterality Date   [place]   had broken right ankle/had 3 surgeries in 1996/ has pins and rods   BOWEL DECOMPRESSION N/A 05/07/2021   Procedure: BOWEL DECOMPRESSION;  Surgeon: Irving Copas., MD;  Location: [facility];  Service: Gastroenterology;  Laterality: N/A;   BOWEL DECOMPRESSION N/A 05/06/2021   Procedure: BOWEL DECOMPRESSION;  Surgeon: Milus Banister, MD;  Location: [facility];  Service: Endoscopy;  Laterality: N/A;   COLONOSCOPY WITH PROPOFOL N/A 05/06/2021   Procedure: COLONOSCOPY WITH PROPOFOL;  Surgeon: Milus Banister, MD;  Location: [facility];  Service: Endoscopy;  Laterality: N/A;   FLEXIBLE SIGMOIDOSCOPY N/A 05/07/2021   Procedure: FLEXIBLE SIGMOIDOSCOPY;  Surgeon: Rush Landmark Telford Nab., MD;  Location: [facility];  Service: Gastroenterology;  Laterality: N/A;   LAPAROTOMY N/A 05/08/2021   Procedure: EXPLORATORY LAPAROTOMY, SIGMOID COLECTOMY, COLOSTOMY;  Surgeon: Jovita Kussmaul, MD;  Location: [facility];  Service: General;  Laterality: N/A;   Current Outpatient Medications  Medication Sig Dispense Refill   acetaminophen (TYLENOL) 650 MG CR tablet Take 650 mg by mouth every 8 (eight) hours as needed for pain.     aspirin EC 81 MG tablet Take 81 mg by mouth daily.     buPROPion (WELLBUTRIN SR) 150 MG 12 hr tablet Take 150 mg by mouth daily.     Cholecalciferol (VITAMIN D3) 25 MCG (1000 UT) CAPS Take 2,000 Units by mouth daily.     Cod Liver Oil 1000 MG  CAPS Take 1,000 mg by mouth daily.     finasteride (PROSCAR) 5 MG tablet Take 5 mg by mouth daily.     fluticasone (FLONASE) 50 MCG/ACT nasal spray Place 1 spray into both nostrils daily as needed for allergies or rhinitis.     losartan (COZAAR) 50 MG tablet Take 50 mg by mouth daily.     Na Sulfate-K Sulfate-Mg Sulf (SUPREP BOWEL PREP KIT) 17.5-3.13-1.6 GM/177ML SOLN Take 1 kit by mouth as directed. 324 mL 0   Omega-3 Fatty Acids (FISH OIL) 1000 MG CAPS Take 1,000 mg by mouth daily.     omeprazole (PRILOSEC) 20 MG capsule Take 20 mg by mouth daily.     oxybutynin (DITROPAN-XL) 5 MG 24 hr tablet Take 5 mg by mouth daily.     rosuvastatin (CRESTOR) 5 MG tablet Take 5 mg by mouth at bedtime.     tamsulosin (FLOMAX) 0.4 MG CAPS capsule Take 0.4 mg by mouth daily.     Travoprost, BAK Free, (TRAVATAN) 0.004 % SOLN ophthalmic solution Place 1 drop into both eyes at bedtime.     trifluoperazine (STELAZINE) 5 MG tablet Take 5 mg by mouth at bedtime.     No current facility-administered medications for this visit.    Current Outpatient Medications:    acetaminophen (TYLENOL) 650 MG CR tablet, Take 650 mg by mouth every 8 (eight) hours as needed for pain., Disp: , Rfl:    aspirin EC  81 MG tablet, Take 81 mg by mouth daily., Disp: , Rfl:    buPROPion (WELLBUTRIN SR) 150 MG 12 hr tablet, Take 150 mg by mouth daily., Disp: , Rfl:    Cholecalciferol (VITAMIN D3) 25 MCG (1000 UT) CAPS, Take 2,000 Units by mouth daily., Disp: , Rfl:    Cod Liver Oil 1000 MG CAPS, Take 1,000 mg by mouth daily., Disp: , Rfl:    finasteride (PROSCAR) 5 MG tablet, Take 5 mg by mouth daily., Disp: , Rfl:    fluticasone (FLONASE) 50 MCG/ACT nasal spray, Place 1 spray into both nostrils daily as needed for allergies or rhinitis., Disp: , Rfl:    losartan (COZAAR) 50 MG tablet, Take 50 mg by mouth daily., Disp: , Rfl:    Na Sulfate-K Sulfate-Mg Sulf (SUPREP BOWEL PREP KIT) 17.5-3.13-1.6 GM/177ML SOLN, Take 1 kit by mouth as  directed., Disp: 324 mL, Rfl: 0   Omega-3 Fatty Acids (FISH OIL) 1000 MG CAPS, Take 1,000 mg by mouth daily., Disp: , Rfl:    omeprazole (PRILOSEC) 20 MG capsule, Take 20 mg by mouth daily., Disp: , Rfl:    oxybutynin (DITROPAN-XL) 5 MG 24 hr tablet, Take 5 mg by mouth daily., Disp: , Rfl:    rosuvastatin (CRESTOR) 5 MG tablet, Take 5 mg by mouth at bedtime., Disp: , Rfl:    tamsulosin (FLOMAX) 0.4 MG CAPS capsule, Take 0.4 mg by mouth daily., Disp: , Rfl:    Travoprost, BAK Free, (TRAVATAN) 0.004 % SOLN ophthalmic solution, Place 1 drop into both eyes at bedtime., Disp: , Rfl:    trifluoperazine (STELAZINE) 5 MG tablet, Take 5 mg by mouth at bedtime., Disp: , Rfl:  No Known Allergies Family History  Problem Relation Age of Onset   Kidney disease Mother    Breast cancer Mother    Breast cancer Father    Leukemia Father    Social History   Socioeconomic History   Marital status: Single    Spouse name: Not on file   Number of children: Not on file   Years of education: Not on file   Highest education level: Not on file  Occupational History   Not on file  Tobacco Use   Smoking status: Never   Smokeless tobacco: Never  Vaping Use   Vaping Use: Never used  Substance and Sexual Activity   Alcohol use: No   Drug use: No   Sexual activity: Not on file  Other Topics Concern   Not on file  Social History Narrative   Not on file   Social Determinants of Health   Financial Resource Strain: Not on file  Food Insecurity: Not on file  Transportation Needs: Not on file  Physical Activity: Not on file  Stress: Not on file  Social Connections: Not on file  Intimate Partner Violence: Not on file    Physical Exam: There were no vitals filed for this visit. There is no height or weight on file to calculate BMI. GEN: NAD EYE: Sclerae anicteric ENT: MMM CV: Non-tachycardic GI: Soft, NT/ND NEURO:  Alert & Oriented x 3  Lab Results: No results for input(s): WBC, HGB, HCT, PLT in  the last 72 hours. BMET No results for input(s): NA, K, CL, CO2, GLUCOSE, BUN, CREATININE, CALCIUM in the last 72 hours. LFT No results for input(s): PROT, ALBUMIN, AST, ALT, ALKPHOS, BILITOT, BILIDIR, IBILI in the last 72 hours. PT/INR No results for input(s): LABPROT, INR in the last 72 hours.   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  The risks and benefits of endoscopic evaluation/treatment were discussed with the patient and/or family; these include but are not limited to the risk of perforation, infection, bleeding, missed lesions, lack of diagnosis, severe illness requiring hospitalization, as well as anesthesia and sedation related illnesses.  The patient's history has been reviewed, patient examined, no change in status, and deemed stable for procedure.  The patient and/or family is agreeable to proceed.    Justice Britain, MD [facility] Office # [phone]

## 2021-09-22 NOTE — Patient Instructions (Signed)
Please read handouts provided. Continue present medications. Await pathology results. Follow prep instructions for tomorrow. Follow diet instructions for surgery tomorrow.    YOU HAD AN ENDOSCOPIC PROCEDURE TODAY AT THE [facility]:   Refer to the procedure report that was given to you for any specific questions about what was found during the examination.  If the procedure report does not answer your questions, please call your gastroenterologist to clarify.  If you requested that your care partner not be given the details of your procedure findings, then the procedure report has been included in a sealed envelope for you to review at your convenience later.  YOU SHOULD EXPECT: Some feelings of bloating in the abdomen. Passage of more gas than usual.  Walking can help get rid of the air that was put into your GI tract during the procedure and reduce the bloating. If you had a lower endoscopy (such as a colonoscopy or flexible sigmoidoscopy) you may notice spotting of blood in your stool or on the toilet paper. If you underwent a bowel prep for your procedure, you may not have a normal bowel movement for a few days.  Please Note:  You might notice some irritation and congestion in your nose or some drainage.  This is from the oxygen used during your procedure.  There is no need for concern and it should clear up in a day or so.  SYMPTOMS TO REPORT IMMEDIATELY:  Following lower endoscopy (colonoscopy or flexible sigmoidoscopy):  Excessive amounts of blood in the stool  Significant tenderness or worsening of abdominal pains  Swelling of the abdomen that is new, acute  Fever of 100F or higher   For urgent or emergent issues, a gastroenterologist can be reached at any hour by calling [phone]. Do not use MyChart messaging for urgent concerns.    DIET: Drink plenty of fluids but you should avoid alcoholic beverages for 24 hours.  ACTIVITY:  You should plan to take it easy  for the rest of today and you should NOT DRIVE or use heavy machinery until tomorrow (because of the sedation medicines used during the test).    FOLLOW UP: Our staff will call the number listed on your records 48-72 hours following your procedure to check on you and address any questions or concerns that you may have regarding the information given to you following your procedure. If we do not reach you, we will leave a message.  We will attempt to reach you two times.  During this call, we will ask if you have developed any symptoms of COVID 19. If you develop any symptoms (ie: fever, flu-like symptoms, shortness of breath, cough etc.) before then, please call [phone].  If you test positive for Covid 19 in the 2 weeks post procedure, please call and report this information to [place].    If any biopsies were taken you will be contacted by phone or by letter within the next 1-3 weeks.  Please call us at [phone] if you have not heard about the biopsies in 3 weeks.    SIGNATURES/CONFIDENTIALITY: You and/or your care partner have signed paperwork which will be entered into your electronic medical record.  These signatures attest to the fact that that the information above on your After Visit Summary has been reviewed and is understood.  Full responsibility of the confidentiality of this discharge information lies with you and/or your care-partner.

## 2021-09-22 NOTE — Op Note (Signed)
[facility] Patient Name: Brandon [name] Procedure Date: 09/22/2021 1:39 PM MRN: [id number] Endoscopist: Justice Britain , MD Age: 75 Referring MD:  Date of Birth: January 18, 1946 Gender: Male Account #: [id number] Procedure:                Colonoscopy Indications:              Screening for colorectal malignant neoplasm, This                            is the patient's first colonoscopy Medicines:                Monitored Anesthesia Care Procedure:                Pre-Anesthesia Assessment:                           - Prior to the procedure, a History and Physical                            was performed, and patient medications and                            allergies were reviewed. The patient's tolerance of                            previous anesthesia was also reviewed. The risks                            and benefits of the procedure and the sedation                            options and risks were discussed with the patient.                            All questions were answered, and informed consent                            was obtained. Prior Anticoagulants: The patient has                            taken no previous anticoagulant or antiplatelet                            agents except for aspirin. ASA Grade Assessment:                            III - A patient with severe systemic disease. After                            reviewing the risks and benefits, the patient was                            deemed in satisfactory condition to undergo the  procedure.                           After obtaining informed consent, the colonoscope                            was passed under direct vision. Throughout the                            procedure, the patient's blood pressure, pulse, and                            oxygen saturations were monitored continuously. The                            CF HQ190L #[id number] was introduced through the                             sigmoid colostomy and advanced to the the cecum,                            identified by appendiceal orifice and ileocecal                            valve. The PCF-HQ190L Colonoscope was introduced                            through the sigmoid colostomy and advanced to the                            the cecum, identified by appendiceal orifice and                            ileocecal valve. The colonoscopy was performed                            without difficulty. The patient tolerated the                            procedure. The quality of the bowel preparation was                            fair. The ileocecal valve, appendiceal orifice, and                            rectum were photographed. Scope In: 1:46:45 PM Scope Out: 2:20:25 PM Scope Withdrawal Time: 0 hours 28 minutes 36 seconds  Total Procedure Duration: 0 hours 33 minutes 40 seconds  Findings:                 The digital rectal exam findings include                            hemorrhoids. Pertinent negatives include no  palpable rectal lesions.                           Extensive amounts of semi-liquid stool was found in                            the entire colon, precluding visualization. Lavage                            of the area was performed using copious amounts,                            resulting in clearance with fair visualization.                           In the fairness of the preparation, normal mucosa                            was found in the entire colon without overt                            evidence of large masses/lesions. Polyps <6 mm                            could have been missed however due to preparation.                           Going through the rectum and into the rectosigmoid                            anastomosis, a localized area of granular/friable                            mucosa was found. Biopsies were taken with a cold                             forceps for histology but this is likely diversion                            inflammation at the anastomosis.                           Non-bleeding non-thrombosed external and internal                            hemorrhoids were found during perianal exam and                            during digital exam. The hemorrhoids were Grade II                            (internal hemorrhoids that prolapse but reduce  spontaneously). Complications:            No immediate complications. Estimated Blood Loss:     Estimated blood loss was minimal. Impression:               - Preparation of the colon was fair even after                            extensive lavage and using 2 colonscopes due to the                            stool clogging the adult colonoscopes..                           - Hemorrhoids found on digital rectal exam.                           - No gross lesions noted throughout the visualized                            colon, though polyps <6 mm could have been missed.                           - Granularity/friability at the rectosigmoid                            anastomosis - biopsied.                           - Non-bleeding non-thrombosed external and internal                            hemorrhoids. Recommendation:           - The patient will be observed post-procedure,                            until all discharge criteria are met.                           - Discharge patient to home.                           - Patient has a contact number available for                            emergencies. The signs and symptoms of potential                            delayed complications were discussed with the                            patient. Return to normal activities tomorrow.                            Written discharge instructions were provided to the  patient.                           - Resume previous  diet.                           - Continue present medications.                           - Recommend additional preparation for tomorrow's                            procedure with Dr. Johney Maine to be extra clean and                            follow the surgical instructions I see no                            contraindication to move forward with this surgery.                           - Await pathology results.                           - Repeat colonoscopy in 6-12 months for screening                            purposes with primary GI, Dr. Ardis Hughs.                           - The findings and recommendations were discussed                            with the patient.                           - The findings and recommendations were discussed                            with the patient's family. Justice Britain, MD 09/22/2021 2:31:22 PM

## 2021-09-22 NOTE — Progress Notes (Signed)
PT taken to PACU. Monitors in place. VSS. Report given to RN. 

## 2021-09-23 ENCOUNTER — Encounter (HOSPITAL_COMMUNITY): Payer: Self-pay | Admitting: Surgery

## 2021-09-23 ENCOUNTER — Inpatient Hospital Stay (HOSPITAL_COMMUNITY): Payer: Medicare Other | Admitting: Anesthesiology

## 2021-09-23 ENCOUNTER — Telehealth: Payer: Self-pay

## 2021-09-23 ENCOUNTER — Encounter (HOSPITAL_COMMUNITY)
Admission: RE | Disposition: A | Discharge status: Home Health | Payer: Self-pay | Source: Home / Self Care | Attending: Surgery

## 2021-09-23 ENCOUNTER — Inpatient Hospital Stay (HOSPITAL_COMMUNITY)
Admission: RE | Admit: 2021-09-23 | Discharge: 2021-09-27 | DRG: 345 | Disposition: A | Discharge status: Home Health | Payer: Medicare Other | Attending: Surgery | Admitting: Surgery

## 2021-09-23 ENCOUNTER — Other Ambulatory Visit (HOSPITAL_COMMUNITY): Payer: Self-pay

## 2021-09-23 DIAGNOSIS — Z20822 Contact with and (suspected) exposure to covid-19: Secondary | ICD-10-CM | POA: Diagnosis present

## 2021-09-23 DIAGNOSIS — I129 Hypertensive chronic kidney disease with stage 1 through stage 4 chronic kidney disease, or unspecified chronic kidney disease: Secondary | ICD-10-CM | POA: Diagnosis present

## 2021-09-23 DIAGNOSIS — K219 Gastro-esophageal reflux disease without esophagitis: Secondary | ICD-10-CM | POA: Diagnosis present

## 2021-09-23 DIAGNOSIS — K562 Volvulus: Secondary | ICD-10-CM | POA: Diagnosis present

## 2021-09-23 DIAGNOSIS — F203 Undifferentiated schizophrenia: Secondary | ICD-10-CM | POA: Diagnosis present

## 2021-09-23 DIAGNOSIS — N1831 Chronic kidney disease, stage 3a: Secondary | ICD-10-CM | POA: Diagnosis present

## 2021-09-23 DIAGNOSIS — K660 Peritoneal adhesions (postprocedural) (postinfection): Secondary | ICD-10-CM | POA: Diagnosis present

## 2021-09-23 DIAGNOSIS — D62 Acute posthemorrhagic anemia: Secondary | ICD-10-CM | POA: Diagnosis not present

## 2021-09-23 DIAGNOSIS — Z79899 Other long term (current) drug therapy: Secondary | ICD-10-CM

## 2021-09-23 DIAGNOSIS — Z433 Encounter for attention to colostomy: Principal | ICD-10-CM

## 2021-09-23 DIAGNOSIS — Z7982 Long term (current) use of aspirin: Secondary | ICD-10-CM | POA: Diagnosis not present

## 2021-09-23 DIAGNOSIS — I872 Venous insufficiency (chronic) (peripheral): Secondary | ICD-10-CM | POA: Diagnosis present

## 2021-09-23 DIAGNOSIS — Z9049 Acquired absence of other specified parts of digestive tract: Secondary | ICD-10-CM | POA: Diagnosis not present

## 2021-09-23 DIAGNOSIS — Z806 Family history of leukemia: Secondary | ICD-10-CM

## 2021-09-23 DIAGNOSIS — F209 Schizophrenia, unspecified: Secondary | ICD-10-CM | POA: Diagnosis present

## 2021-09-23 HISTORY — PX: XI ROBOTIC ASSISTED COLOSTOMY TAKEDOWN: SHX6828

## 2021-09-23 HISTORY — PX: PROCTOSCOPY: SHX2266

## 2021-09-23 HISTORY — PX: LYSIS OF ADHESION: SHX5961

## 2021-09-23 SURGERY — CLOSURE, COLOSTOMY, ROBOT-ASSISTED
Anesthesia: General | Site: Rectum

## 2021-09-23 MED ORDER — ONDANSETRON HCL 4 MG PO TABS: 4.0000 mg | ORAL_TABLET | Freq: Four times a day (QID) | ORAL | Status: DC | PRN

## 2021-09-23 MED ORDER — ROCURONIUM BROMIDE 10 MG/ML (PF) SYRINGE
PREFILLED_SYRINGE | INTRAVENOUS | Status: DC | PRN
Start: 2021-09-23 — End: 2021-09-23
  Administered 2021-09-23: 80 mg via INTRAVENOUS
  Administered 2021-09-23: 20 mg via INTRAVENOUS

## 2021-09-23 MED ORDER — MELATONIN 3 MG PO TABS: 3.0000 mg | ORAL_TABLET | Freq: Every evening | ORAL | Status: DC | PRN

## 2021-09-23 MED ORDER — HALOPERIDOL LACTATE 5 MG/ML IJ SOLN
2.0000 mg | Freq: Four times a day (QID) | INTRAMUSCULAR | Status: DC | PRN
Start: 2021-09-23 — End: 2021-09-28

## 2021-09-23 MED ORDER — FLUTICASONE PROPIONATE 50 MCG/ACT NA SUSP
1.0000 | Freq: Every day | NASAL | Status: DC | PRN
  Filled 2021-09-23: qty 16

## 2021-09-23 MED ORDER — ACETAMINOPHEN 500 MG PO TABS
1000.0000 mg | ORAL_TABLET | ORAL | Status: AC
  Administered 2021-09-23: 1000 mg via ORAL
  Filled 2021-09-23: qty 2

## 2021-09-23 MED ORDER — BUPIVACAINE LIPOSOME 1.3 % IJ SUSP
INTRAMUSCULAR | Status: AC
  Filled 2021-09-23: qty 20

## 2021-09-23 MED ORDER — ENOXAPARIN SODIUM 40 MG/0.4ML IJ SOSY
40.0000 mg | PREFILLED_SYRINGE | INTRAMUSCULAR | Status: DC
  Administered 2021-09-24 – 2021-09-27 (×4): 40 mg via SUBCUTANEOUS
  Filled 2021-09-23 (×4): qty 0.4

## 2021-09-23 MED ORDER — ORAL CARE MOUTH RINSE: 15.0000 mL | Freq: Once | OROMUCOSAL | Status: AC

## 2021-09-23 MED ORDER — GABAPENTIN 300 MG PO CAPS
300.0000 mg | ORAL_CAPSULE | ORAL | Status: AC
  Administered 2021-09-23: 300 mg via ORAL
  Filled 2021-09-23: qty 1

## 2021-09-23 MED ORDER — HYDROMORPHONE HCL 1 MG/ML IJ SOLN: 0.5000 mg | INTRAMUSCULAR | Status: DC | PRN

## 2021-09-23 MED ORDER — ROCURONIUM BROMIDE 10 MG/ML (PF) SYRINGE
PREFILLED_SYRINGE | INTRAVENOUS | Status: AC
  Filled 2021-09-23: qty 10

## 2021-09-23 MED ORDER — NEOMYCIN SULFATE 500 MG PO TABS: 1000.0000 mg | ORAL_TABLET | ORAL | Status: DC

## 2021-09-23 MED ORDER — LACTATED RINGERS IV SOLN: INTRAVENOUS | Status: DC

## 2021-09-23 MED ORDER — ENOXAPARIN SODIUM 40 MG/0.4ML IJ SOSY
40.0000 mg | PREFILLED_SYRINGE | Freq: Once | INTRAMUSCULAR | Status: AC
  Administered 2021-09-23: 40 mg via SUBCUTANEOUS
  Filled 2021-09-23: qty 0.4

## 2021-09-23 MED ORDER — CHLORHEXIDINE GLUCONATE CLOTH 2 % EX PADS
6.0000 | MEDICATED_PAD | Freq: Once | CUTANEOUS | Status: DC
  Administered 2021-09-23: 6 via TOPICAL

## 2021-09-23 MED ORDER — MIDAZOLAM HCL 5 MG/5ML IJ SOLN
INTRAMUSCULAR | Status: DC | PRN
  Administered 2021-09-23: 1 mg via INTRAVENOUS

## 2021-09-23 MED ORDER — TRIFLUOPERAZINE HCL 5 MG PO TABS
5.0000 mg | ORAL_TABLET | Freq: Every day | ORAL | Status: DC
  Administered 2021-09-23 – 2021-09-26 (×4): 5 mg via ORAL
  Filled 2021-09-23 (×5): qty 1

## 2021-09-23 MED ORDER — DIPHENHYDRAMINE HCL 50 MG/ML IJ SOLN: 12.5000 mg | Freq: Four times a day (QID) | INTRAMUSCULAR | Status: DC | PRN

## 2021-09-23 MED ORDER — METHOCARBAMOL 1000 MG/10ML IJ SOLN
1000.0000 mg | Freq: Four times a day (QID) | INTRAVENOUS | Status: DC | PRN
  Filled 2021-09-23: qty 10

## 2021-09-23 MED ORDER — SUGAMMADEX SODIUM 200 MG/2ML IV SOLN
INTRAVENOUS | Status: DC | PRN
  Administered 2021-09-23: 200 mg via INTRAVENOUS

## 2021-09-23 MED ORDER — PHENYLEPHRINE 40 MCG/ML (10ML) SYRINGE FOR IV PUSH (FOR BLOOD PRESSURE SUPPORT)
PREFILLED_SYRINGE | INTRAVENOUS | Status: DC | PRN
Start: 2021-09-23 — End: 2021-09-23
  Administered 2021-09-23 (×2): 80 ug via INTRAVENOUS

## 2021-09-23 MED ORDER — METRONIDAZOLE 500 MG PO TABS: 1000.0000 mg | ORAL_TABLET | ORAL | Status: DC

## 2021-09-23 MED ORDER — METHOCARBAMOL 500 MG PO TABS: 1000.0000 mg | ORAL_TABLET | Freq: Four times a day (QID) | ORAL | Status: DC | PRN

## 2021-09-23 MED ORDER — FINASTERIDE 5 MG PO TABS
5.0000 mg | ORAL_TABLET | Freq: Every day | ORAL | Status: DC
  Administered 2021-09-24 – 2021-09-27 (×4): 5 mg via ORAL
  Filled 2021-09-23 (×4): qty 1

## 2021-09-23 MED ORDER — SIMETHICONE 80 MG PO CHEW: 40.0000 mg | CHEWABLE_TABLET | Freq: Four times a day (QID) | ORAL | Status: DC | PRN

## 2021-09-23 MED ORDER — TAMSULOSIN HCL 0.4 MG PO CAPS
0.4000 mg | ORAL_CAPSULE | Freq: Every day | ORAL | Status: DC
  Administered 2021-09-24 – 2021-09-27 (×4): 0.4 mg via ORAL
  Filled 2021-09-23 (×4): qty 1

## 2021-09-23 MED ORDER — ONDANSETRON HCL 4 MG/2ML IJ SOLN: 4.0000 mg | Freq: Once | INTRAMUSCULAR | Status: DC | PRN

## 2021-09-23 MED ORDER — CHLORHEXIDINE GLUCONATE CLOTH 2 % EX PADS: 6.0000 | MEDICATED_PAD | Freq: Once | CUTANEOUS | Status: DC

## 2021-09-23 MED ORDER — ONDANSETRON HCL 4 MG/2ML IJ SOLN: 4.0000 mg | Freq: Four times a day (QID) | INTRAMUSCULAR | Status: DC | PRN

## 2021-09-23 MED ORDER — OXYCODONE HCL 5 MG PO TABS: 5.0000 mg | ORAL_TABLET | Freq: Once | ORAL | Status: DC | PRN

## 2021-09-23 MED ORDER — EPHEDRINE SULFATE-NACL 50-0.9 MG/10ML-% IV SOSY
PREFILLED_SYRINGE | INTRAVENOUS | Status: DC | PRN
  Administered 2021-09-23 (×2): 5 mg via INTRAVENOUS

## 2021-09-23 MED ORDER — PROPOFOL 10 MG/ML IV BOLUS
INTRAVENOUS | Status: DC | PRN
  Administered 2021-09-23: 130 mg via INTRAVENOUS

## 2021-09-23 MED ORDER — PHENYLEPHRINE HCL-NACL 20-0.9 MG/250ML-% IV SOLN
INTRAVENOUS | Status: DC | PRN
  Administered 2021-09-23: 25 ug/min via INTRAVENOUS

## 2021-09-23 MED ORDER — ALVIMOPAN 12 MG PO CAPS
12.0000 mg | ORAL_CAPSULE | Freq: Two times a day (BID) | ORAL | Status: DC
  Administered 2021-09-24 – 2021-09-26 (×4): 12 mg via ORAL
  Filled 2021-09-23 (×4): qty 1

## 2021-09-23 MED ORDER — OXYBUTYNIN CHLORIDE ER 5 MG PO TB24
5.0000 mg | ORAL_TABLET | Freq: Every day | ORAL | Status: DC
  Administered 2021-09-24 – 2021-09-27 (×4): 5 mg via ORAL
  Filled 2021-09-23 (×4): qty 1

## 2021-09-23 MED ORDER — DEXAMETHASONE SODIUM PHOSPHATE 10 MG/ML IJ SOLN
INTRAMUSCULAR | Status: AC
  Filled 2021-09-23: qty 1

## 2021-09-23 MED ORDER — ONDANSETRON HCL 4 MG/2ML IJ SOLN
INTRAMUSCULAR | Status: AC
  Filled 2021-09-23: qty 2

## 2021-09-23 MED ORDER — BUPIVACAINE LIPOSOME 1.3 % IJ SUSP
INTRAMUSCULAR | Status: DC | PRN
  Administered 2021-09-23: 20 mL

## 2021-09-23 MED ORDER — ENSURE PRE-SURGERY PO LIQD
296.0000 mL | Freq: Once | ORAL | Status: DC
  Filled 2021-09-23: qty 296

## 2021-09-23 MED ORDER — LACTATED RINGERS IV BOLUS: 1000.0000 mL | Freq: Three times a day (TID) | INTRAVENOUS | Status: DC | PRN

## 2021-09-23 MED ORDER — SODIUM CHLORIDE 0.9 % IV SOLN: Freq: Three times a day (TID) | INTRAVENOUS | Status: DC | PRN

## 2021-09-23 MED ORDER — LIDOCAINE HCL (PF) 2 % IJ SOLN
INTRAMUSCULAR | Status: AC
  Filled 2021-09-23: qty 10

## 2021-09-23 MED ORDER — PROCHLORPERAZINE MALEATE 10 MG PO TABS
10.0000 mg | ORAL_TABLET | Freq: Four times a day (QID) | ORAL | Status: DC | PRN
  Filled 2021-09-23: qty 1

## 2021-09-23 MED ORDER — ONDANSETRON HCL 4 MG/2ML IJ SOLN
INTRAMUSCULAR | Status: DC | PRN
  Administered 2021-09-23: 4 mg via INTRAVENOUS

## 2021-09-23 MED ORDER — FENTANYL CITRATE (PF) 250 MCG/5ML IJ SOLN
INTRAMUSCULAR | Status: DC | PRN
  Administered 2021-09-23 (×2): 50 ug via INTRAVENOUS
  Administered 2021-09-23 (×2): 100 ug via INTRAVENOUS

## 2021-09-23 MED ORDER — MIDAZOLAM HCL 2 MG/2ML IJ SOLN
INTRAMUSCULAR | Status: AC
  Filled 2021-09-23: qty 2

## 2021-09-23 MED ORDER — BUPROPION HCL ER (SR) 150 MG PO TB12
150.0000 mg | ORAL_TABLET | Freq: Every day | ORAL | Status: DC
  Administered 2021-09-24 – 2021-09-27 (×4): 150 mg via ORAL
  Filled 2021-09-23 (×4): qty 1

## 2021-09-23 MED ORDER — ASPIRIN EC 81 MG PO TBEC
81.0000 mg | DELAYED_RELEASE_TABLET | Freq: Every day | ORAL | Status: DC
  Administered 2021-09-24 – 2021-09-27 (×4): 81 mg via ORAL
  Filled 2021-09-23 (×4): qty 1

## 2021-09-23 MED ORDER — FENTANYL CITRATE (PF) 250 MCG/5ML IJ SOLN
INTRAMUSCULAR | Status: AC
  Filled 2021-09-23: qty 5

## 2021-09-23 MED ORDER — ALUM & MAG HYDROXIDE-SIMETH 200-200-20 MG/5ML PO SUSP: 30.0000 mL | Freq: Four times a day (QID) | ORAL | Status: DC | PRN

## 2021-09-23 MED ORDER — LIP MEDEX EX OINT
1.0000 "application " | TOPICAL_OINTMENT | Freq: Two times a day (BID) | CUTANEOUS | Status: DC
  Administered 2021-09-24 – 2021-09-27 (×4): 1 via TOPICAL
  Filled 2021-09-23: qty 7

## 2021-09-23 MED ORDER — LIDOCAINE 2% (20 MG/ML) 5 ML SYRINGE
INTRAMUSCULAR | Status: DC | PRN
Start: 2021-09-23 — End: 2021-09-23
  Administered 2021-09-23: 80 mg via INTRAVENOUS
  Administered 2021-09-23: 1.5 mg/kg/h via INTRAVENOUS

## 2021-09-23 MED ORDER — ALVIMOPAN 12 MG PO CAPS
12.0000 mg | ORAL_CAPSULE | ORAL | Status: AC
  Administered 2021-09-23: 12 mg via ORAL
  Filled 2021-09-23: qty 1

## 2021-09-23 MED ORDER — BUPIVACAINE-EPINEPHRINE 0.25% -1:200000 IJ SOLN
INTRAMUSCULAR | Status: AC
  Filled 2021-09-23: qty 1

## 2021-09-23 MED ORDER — ACETAMINOPHEN 500 MG PO TABS
1000.0000 mg | ORAL_TABLET | Freq: Four times a day (QID) | ORAL | Status: DC
  Administered 2021-09-23 – 2021-09-27 (×14): 1000 mg via ORAL
  Filled 2021-09-23 (×13): qty 2

## 2021-09-23 MED ORDER — SODIUM CHLORIDE 0.9 % IV SOLN
2.0000 g | INTRAVENOUS | Status: AC
  Administered 2021-09-23: 2 g via INTRAVENOUS
  Filled 2021-09-23: qty 2

## 2021-09-23 MED ORDER — LOSARTAN POTASSIUM 50 MG PO TABS
50.0000 mg | ORAL_TABLET | Freq: Every day | ORAL | Status: DC
  Administered 2021-09-24 – 2021-09-27 (×4): 50 mg via ORAL
  Filled 2021-09-23 (×4): qty 1

## 2021-09-23 MED ORDER — ENALAPRILAT 1.25 MG/ML IV SOLN
0.6250 mg | Freq: Four times a day (QID) | INTRAVENOUS | Status: DC | PRN
Start: 2021-09-23 — End: 2021-09-28
  Filled 2021-09-23: qty 1

## 2021-09-23 MED ORDER — TRAMADOL HCL 50 MG PO TABS
50.0000 mg | ORAL_TABLET | Freq: Four times a day (QID) | ORAL | 0 refills | Status: DC | PRN
  Filled 2021-09-23: qty 20, 2d supply, fill #0

## 2021-09-23 MED ORDER — AMISULPRIDE (ANTIEMETIC) 5 MG/2ML IV SOLN: 10.0000 mg | Freq: Once | INTRAVENOUS | Status: DC | PRN

## 2021-09-23 MED ORDER — DEXAMETHASONE SODIUM PHOSPHATE 10 MG/ML IJ SOLN
INTRAMUSCULAR | Status: DC | PRN
  Administered 2021-09-23: 5 mg via INTRAVENOUS

## 2021-09-23 MED ORDER — ROSUVASTATIN CALCIUM 5 MG PO TABS
5.0000 mg | ORAL_TABLET | Freq: Every day | ORAL | Status: DC
  Administered 2021-09-23 – 2021-09-26 (×4): 5 mg via ORAL
  Filled 2021-09-23 (×4): qty 1

## 2021-09-23 MED ORDER — BUPIVACAINE-EPINEPHRINE (PF) 0.25% -1:200000 IJ SOLN
INTRAMUSCULAR | Status: AC
  Filled 2021-09-23: qty 30

## 2021-09-23 MED ORDER — LATANOPROST 0.005 % OP SOLN
1.0000 [drp] | Freq: Every day | OPHTHALMIC | Status: DC
  Administered 2021-09-23 – 2021-09-26 (×4): 1 [drp] via OPHTHALMIC
  Filled 2021-09-23: qty 2.5

## 2021-09-23 MED ORDER — 0.9 % SODIUM CHLORIDE (POUR BTL) OPTIME
TOPICAL | Status: DC | PRN
  Administered 2021-09-23: 2000 mL

## 2021-09-23 MED ORDER — DIPHENHYDRAMINE HCL 12.5 MG/5ML PO ELIX: 12.5000 mg | ORAL_SOLUTION | Freq: Four times a day (QID) | ORAL | Status: DC | PRN

## 2021-09-23 MED ORDER — FENTANYL CITRATE (PF) 100 MCG/2ML IJ SOLN
INTRAMUSCULAR | Status: AC
  Filled 2021-09-23: qty 2

## 2021-09-23 MED ORDER — BUPIVACAINE-EPINEPHRINE (PF) 0.25% -1:200000 IJ SOLN
INTRAMUSCULAR | Status: DC | PRN
  Administered 2021-09-23: 50 mL

## 2021-09-23 MED ORDER — OXYCODONE HCL 5 MG/5ML PO SOLN: 5.0000 mg | Freq: Once | ORAL | Status: DC | PRN

## 2021-09-23 MED ORDER — PROCHLORPERAZINE EDISYLATE 10 MG/2ML IJ SOLN: 5.0000 mg | Freq: Four times a day (QID) | INTRAMUSCULAR | Status: DC | PRN

## 2021-09-23 MED ORDER — ENSURE SURGERY PO LIQD
237.0000 mL | Freq: Two times a day (BID) | ORAL | Status: DC
  Administered 2021-09-24 – 2021-09-27 (×8): 237 mL via ORAL

## 2021-09-23 MED ORDER — PANTOPRAZOLE SODIUM 40 MG PO TBEC
40.0000 mg | DELAYED_RELEASE_TABLET | Freq: Every day | ORAL | Status: DC
  Administered 2021-09-24 – 2021-09-27 (×4): 40 mg via ORAL
  Filled 2021-09-23 (×4): qty 1

## 2021-09-23 MED ORDER — CALCIUM POLYCARBOPHIL 625 MG PO TABS
625.0000 mg | ORAL_TABLET | Freq: Two times a day (BID) | ORAL | Status: DC
  Administered 2021-09-23 – 2021-09-27 (×8): 625 mg via ORAL
  Filled 2021-09-23 (×8): qty 1

## 2021-09-23 MED ORDER — MAGIC MOUTHWASH
15.0000 mL | Freq: Four times a day (QID) | ORAL | Status: DC | PRN
  Filled 2021-09-23: qty 15

## 2021-09-23 MED ORDER — EPHEDRINE 5 MG/ML INJ
INTRAVENOUS | Status: AC
  Filled 2021-09-23: qty 5

## 2021-09-23 MED ORDER — TRAMADOL HCL 50 MG PO TABS: 50.0000 mg | ORAL_TABLET | Freq: Four times a day (QID) | ORAL | Status: DC | PRN

## 2021-09-23 MED ORDER — ENSURE PRE-SURGERY PO LIQD
592.0000 mL | Freq: Once | ORAL | Status: DC
  Filled 2021-09-23: qty 592

## 2021-09-23 MED ORDER — CHLORHEXIDINE GLUCONATE 0.12 % MT SOLN
15.0000 mL | Freq: Once | OROMUCOSAL | Status: AC
  Administered 2021-09-23: 15 mL via OROMUCOSAL

## 2021-09-23 MED ORDER — POLYETHYLENE GLYCOL 3350 17 GM/SCOOP PO POWD: 1.0000 | Freq: Once | ORAL | Status: DC

## 2021-09-23 MED ORDER — FENTANYL CITRATE PF 50 MCG/ML IJ SOSY: 25.0000 ug | PREFILLED_SYRINGE | INTRAMUSCULAR | Status: DC | PRN

## 2021-09-23 MED ORDER — PROPOFOL 10 MG/ML IV BOLUS
INTRAVENOUS | Status: AC
  Filled 2021-09-23: qty 20

## 2021-09-23 MED ORDER — BISACODYL 5 MG PO TBEC: 20.0000 mg | DELAYED_RELEASE_TABLET | Freq: Once | ORAL | Status: DC

## 2021-09-23 MED ORDER — SODIUM CHLORIDE 0.9 % IV SOLN
2.0000 g | Freq: Two times a day (BID) | INTRAVENOUS | Status: AC
  Administered 2021-09-24: 2 g via INTRAVENOUS
  Filled 2021-09-23: qty 2

## 2021-09-23 MED ORDER — METOPROLOL TARTRATE 5 MG/5ML IV SOLN: 5.0000 mg | Freq: Four times a day (QID) | INTRAVENOUS | Status: DC | PRN

## 2021-09-23 MED ORDER — BUPIVACAINE LIPOSOME 1.3 % IJ SUSP: 20.0000 mL | Freq: Once | INTRAMUSCULAR | Status: DC

## 2021-09-23 MED ORDER — LACTATED RINGERS IR SOLN
Status: DC | PRN
  Administered 2021-09-23: 1000 mL

## 2021-09-23 SURGICAL SUPPLY — 105 items
APPLIER CLIP 5 13 M/L LIGAMAX5 (MISCELLANEOUS)
APPLIER CLIP ROT 10 11.4 M/L (STAPLE)
BAG COUNTER SPONGE SURGICOUNT (BAG) ×3 IMPLANT
BLADE EXTENDED COATED 6.5IN (ELECTRODE) IMPLANT
CANNULA REDUC XI 12-8 STAPL (CANNULA)
CANNULA REDUCER 12-8 DVNC XI (CANNULA) IMPLANT
CELLS DAT CNTRL 66122 CELL SVR (MISCELLANEOUS) IMPLANT
CHLORAPREP W/TINT 26 (MISCELLANEOUS) IMPLANT
CLIP APPLIE 5 13 M/L LIGAMAX5 (MISCELLANEOUS) IMPLANT
CLIP APPLIE ROT 10 11.4 M/L (STAPLE) IMPLANT
COVER SURGICAL LIGHT HANDLE (MISCELLANEOUS) ×6 IMPLANT
COVER TIP SHEARS 8 DVNC (MISCELLANEOUS) ×2 IMPLANT
COVER TIP SHEARS 8MM DA VINCI (MISCELLANEOUS) ×3
DECANTER SPIKE VIAL GLASS SM (MISCELLANEOUS) ×3 IMPLANT
DEVICE TROCAR PUNCTURE CLOSURE (ENDOMECHANICALS) IMPLANT
DRAIN CHANNEL 19F RND (DRAIN) IMPLANT
DRAPE ARM DVNC X/XI (DISPOSABLE) ×8 IMPLANT
DRAPE COLUMN DVNC XI (DISPOSABLE) ×2 IMPLANT
DRAPE DA VINCI XI ARM (DISPOSABLE) ×12
DRAPE DA VINCI XI COLUMN (DISPOSABLE) ×3
DRAPE SURG IRRIG POUCH 19X23 (DRAPES) ×3 IMPLANT
DRSG OPSITE POSTOP 4X10 (GAUZE/BANDAGES/DRESSINGS) IMPLANT
DRSG OPSITE POSTOP 4X6 (GAUZE/BANDAGES/DRESSINGS) ×3 IMPLANT
DRSG OPSITE POSTOP 4X8 (GAUZE/BANDAGES/DRESSINGS) IMPLANT
DRSG TEGADERM 2-3/8X2-3/4 SM (GAUZE/BANDAGES/DRESSINGS) ×15 IMPLANT
DRSG TEGADERM 4X4.75 (GAUZE/BANDAGES/DRESSINGS) IMPLANT
ELECT PENCIL ROCKER SW 15FT (MISCELLANEOUS) ×3 IMPLANT
ELECT REM PT RETURN 15FT ADLT (MISCELLANEOUS) ×3 IMPLANT
ENDOLOOP SUT PDS II  0 18 (SUTURE)
ENDOLOOP SUT PDS II 0 18 (SUTURE) IMPLANT
EVACUATOR SILICONE 100CC (DRAIN) IMPLANT
GAUZE SPONGE 2X2 8PLY STRL LF (GAUZE/BANDAGES/DRESSINGS) ×2 IMPLANT
GLOVE SURG NEOPR MICRO LF SZ8 (GLOVE) ×9 IMPLANT
GLOVE SURG UNDER LTX SZ8 (GLOVE) ×9 IMPLANT
GOWN STRL REUS W/TWL XL LVL3 (GOWN DISPOSABLE) ×9 IMPLANT
GRASPER SUT TROCAR 14GX15 (MISCELLANEOUS) IMPLANT
HOLDER FOLEY CATH W/STRAP (MISCELLANEOUS) ×3 IMPLANT
IRRIG SUCT STRYKERFLOW 2 WTIP (MISCELLANEOUS) ×3
IRRIGATION SUCT STRKRFLW 2 WTP (MISCELLANEOUS) ×2 IMPLANT
KIT PROCEDURE DA VINCI SI (MISCELLANEOUS)
KIT PROCEDURE DVNC SI (MISCELLANEOUS) IMPLANT
KIT SIGMOIDOSCOPE (SET/KITS/TRAYS/PACK) IMPLANT
KIT TURNOVER KIT A (KITS) IMPLANT
NEEDLE INSUFFLATION 14GA 120MM (NEEDLE) ×3 IMPLANT
PACK CARDIOVASCULAR III (CUSTOM PROCEDURE TRAY) ×3 IMPLANT
PACK COLON (CUSTOM PROCEDURE TRAY) ×3 IMPLANT
PAD POSITIONING PINK XL (MISCELLANEOUS) ×3 IMPLANT
PROTECTOR NERVE ULNAR (MISCELLANEOUS) ×6 IMPLANT
RELOAD STAPLER 3.5X45 BLU DVNC (STAPLE) IMPLANT
RELOAD STAPLER 3.5X60 BLU DVNC (STAPLE) IMPLANT
RELOAD STAPLER 4.3X45 GRN DVNC (STAPLE) IMPLANT
RELOAD STAPLER 4.3X60 GRN DVNC (STAPLE) IMPLANT
RTRCTR WOUND ALEXIS 18CM MED (MISCELLANEOUS)
SCISSORS LAP 5X35 DISP (ENDOMECHANICALS) ×3 IMPLANT
SEAL CANN UNIV 5-8 DVNC XI (MISCELLANEOUS) ×6 IMPLANT
SEAL XI 5MM-8MM UNIVERSAL (MISCELLANEOUS) ×9
SEALER VESSEL DA VINCI XI (MISCELLANEOUS) ×3
SEALER VESSEL EXT DVNC XI (MISCELLANEOUS) ×2 IMPLANT
SOLUTION ELECTROLUBE (MISCELLANEOUS) ×3 IMPLANT
SPONGE GAUZE 2X2 STER 10/PKG (GAUZE/BANDAGES/DRESSINGS) ×1
STAPLER 45 DA VINCI SURE FORM (STAPLE)
STAPLER 45 SUREFORM DVNC (STAPLE) IMPLANT
STAPLER 60 DA VINCI SURE FORM (STAPLE)
STAPLER 60 SUREFORM DVNC (STAPLE) IMPLANT
STAPLER CANNULA SEAL DVNC XI (STAPLE) ×2 IMPLANT
STAPLER CANNULA SEAL XI (STAPLE) ×3
STAPLER ECHELON POWER CIR 29 (STAPLE) IMPLANT
STAPLER ECHELON POWER CIR 31 (STAPLE) ×3 IMPLANT
STAPLER RELOAD 3.5X45 BLU DVNC (STAPLE)
STAPLER RELOAD 3.5X45 BLUE (STAPLE)
STAPLER RELOAD 3.5X60 BLU DVNC (STAPLE)
STAPLER RELOAD 3.5X60 BLUE (STAPLE)
STAPLER RELOAD 4.3X45 GREEN (STAPLE)
STAPLER RELOAD 4.3X45 GRN DVNC (STAPLE)
STAPLER RELOAD 4.3X60 GREEN (STAPLE)
STAPLER RELOAD 4.3X60 GRN DVNC (STAPLE)
STOPCOCK 4 WAY LG BORE MALE ST (IV SETS) ×6 IMPLANT
SURGILUBE 2OZ TUBE FLIPTOP (MISCELLANEOUS) IMPLANT
SUT MNCRL AB 4-0 PS2 18 (SUTURE) ×3 IMPLANT
SUT PDS AB 1 CT1 27 (SUTURE) ×6 IMPLANT
SUT PROLENE 0 CT 2 (SUTURE) IMPLANT
SUT PROLENE 2 0 KS (SUTURE) IMPLANT
SUT PROLENE 2 0 SH DA (SUTURE) IMPLANT
SUT SILK 2 0 (SUTURE) ×3
SUT SILK 2 0 SH CR/8 (SUTURE) IMPLANT
SUT SILK 2-0 18XBRD TIE 12 (SUTURE) ×2 IMPLANT
SUT SILK 3 0 (SUTURE)
SUT SILK 3 0 SH CR/8 (SUTURE) ×3 IMPLANT
SUT SILK 3-0 18XBRD TIE 12 (SUTURE) IMPLANT
SUT V-LOC BARB 180 2/0GR6 GS22 (SUTURE)
SUT VIC AB 3-0 SH 18 (SUTURE) IMPLANT
SUT VIC AB 3-0 SH 27 (SUTURE)
SUT VIC AB 3-0 SH 27XBRD (SUTURE) IMPLANT
SUT VICRYL 0 UR6 27IN ABS (SUTURE) ×3 IMPLANT
SUTURE V-LC BRB 180 2/0GR6GS22 (SUTURE) IMPLANT
SYR 10ML ECCENTRIC (SYRINGE) ×3 IMPLANT
SYS LAPSCP GELPORT 120MM (MISCELLANEOUS)
SYS WOUND ALEXIS 18CM MED (MISCELLANEOUS) ×3
SYSTEM LAPSCP GELPORT 120MM (MISCELLANEOUS) IMPLANT
SYSTEM WOUND ALEXIS 18CM MED (MISCELLANEOUS) ×2 IMPLANT
TOWEL OR NON WOVEN STRL DISP B (DISPOSABLE) ×3 IMPLANT
TRAY FOLEY MTR SLVR 16FR STAT (SET/KITS/TRAYS/PACK) ×3 IMPLANT
TROCAR ADV FIXATION 5X100MM (TROCAR) ×3 IMPLANT
TUBING CONNECTING 10 (TUBING) ×6 IMPLANT
TUBING INSUFFLATION 10FT LAP (TUBING) ×3 IMPLANT

## 2021-09-23 NOTE — H&P (Signed)
09/23/2021     PROVIDER: Hollace Kinnier, MD  Patient Care Team: [name], DO as PCP - General (Family Medicine) [name], DO (Family Medicine) Landry Corporal, MD as Consulting Provider (General Surgery) [name], MD (Cardiovascular Disease) Milus Banister, MD (Gastroenterology) Johney Maine, Adrian Saran, MD as Consulting Provider (Colon and Rectal Surgery) Lynwood Dawley, MD (Nephrology)  [facility] MRN: [id number] DOB: Jul 02, 1946   Interval History:   The patient returns to the office after undergoing endoscopic decompression by [facility] gastroenterology Dr. Ardis Hughs 7/21 and then Dr. Early Osmond 7/22; then ,eventual sigmoid colectomy and colostomy for obstructing sigmoid volvulus 05/08/2021 by Dr. Marlou Starks  Pathology benign.  Eventually recovered went home postoperative day 5 5. Followed closely by family. Does struggle with schizophrenia along with hypertension, chronic kidney disease, venous insufficiency. Followed up with the office in August and seemed to be doing relatively well. The family wishes to consider colostomy takedown so referral made to me.  Patient comes today with a rolling walker. Wife wanted the patient's sister in the room as well. Patient denies pain. He is eating well with good appetite. They are able to change the colostomy patient appliance about twice a week. Empty the colostomy bag about every day or so. He followed up with nephrology given his chronic kidney disease. His creatinine normalized since discharge. 1 year follow-up recommended. He has a borderline elevated hemoglobin A1c but no definite diagnosis of diabetes. He does not smoke. No sleep apnea. No rectal bleeding. No history of polyps or cancer that he is aware of.  Patient had colonoscopy done yesterday.  Fair prep.  Enough washout to rule out any major lesions.  Recommended to have colonoscopy in a year after colostomy takedown.    Labs, Imaging and Diagnostic  Testing:  Located in [place]' section of Epic EMR chart  PRIOR NOTES   Located in [place]' section of Epic EMR chart  SURGERY NOTES:  Located in [place]' section of Epic EMR chart   05/08/2021   12:45 PM   PATIENT: Brandon [name] 75 y.o. male   PRE-OPERATIVE DIAGNOSIS: volvulus of sigmoid colon   POST-OPERATIVE DIAGNOSIS: volvulus of sigmoid colon   PROCEDURE: Procedure(s): EXPLORATORY LAPAROTOMY, SIGMOID COLECTOMY, COLOSTOMY (N/A)   SURGEON: Surgeon(s) and Role: * Jovita Kussmaul, MD - Primary * Coralie Keens, MD - Assisting   PHYSICIAN ASSISTANT:    ASSISTANTS: Dr. Ninfa Linden    ANESTHESIA: general   EBL: minimal    BLOOD ADMINISTERED:none   DRAINS: none    LOCAL MEDICATIONS USED: NONE   SPECIMEN: Source of Specimen: sigmoid colon   DISPOSITION OF SPECIMEN: PATHOLOGY   COUNTS: YES   TOURNIQUET: * No tourniquets in log *   DICTATION: .Dragon Dictation   After informed consent was obtained the patient was brought to the operating room and placed in the supine position on the operating table. After adequate induction of general anesthesia the patient was placed in lithotomy position and all pressure points were padded. The abdomen was then prepped with ChloraPrep and the perineum prepped with Betadine, allowed to dry, and draped in usual sterile manner. An appropriate timeout was performed. A midline incision was made with a 10 blade knife. The incision was carried through the skin and subcutaneous tissue sharply with the electrocautery into the linea alba was identified. The linea alba was incised also with the electrocautery. The preperitoneal space was probed bluntly with a hemostat until the peritoneum was opened and access was gained  to the abdominal cavity. The rest of the incision was opened under direct vision. The abdomen was generally inspected and no significant abnormalities were noted other than a very large redundant  sigmoid colon. All of the colon was fairly dilated though. Because of the size and distention of the entire colon we elected to resect the area of volvulus and bring out a proximal end colostomy. The site was chosen above and below the area of volvulus. The mesentery at each of these 2 points was opened sharply with the electrocautery. A GIA-75 stapler was placed across the colon at each of these points, clamped, and fired thereby dividing the colon and rectum between staple lines. The descending colon was then mobilized by incising its retroperitoneal attachment along the white line of Toldt. After doing this there was good mobility to the descending colon. The edge of the rectal stump was marked with a 2-0 Prolene stitch and the staple line was controlled because of some oozing with multiple interrupted 2-0 silk figure-of-eight stitches. The abdomen was then irrigated with copious amounts of saline. A site was chosen on the left side of the abdominal wall for bringing out the ostomy. A small circle of skin and subcutaneous tissue was removed sharply with the electrocautery. A cruciate incision was made in the fascia of the anterior abdominal wall so that 2 fingers could be brought through the opening. A Babcock was placed through this opening and used to grasp the staple line of the descending colon and bring it out through the abdominal wall. The colon reached easily without tension. The fascia of the anterior abdominal wall was then closed with 2 running #1 double-stranded looped PDS sutures. The subcutaneous tissue was irrigated with copious amounts of saline. The skin was closed with staples. The skin was then covered with a sterile green towel. The staple line of the ostomy was then removed sharply with the electrocautery. The ostomy was matured with interrupted 3-0 Vicryl stitches. The ostomy appeared pink and healthy. Finger was placed into the ostomy and the ostomy was patent with no obstruction. A ostomy  bag and sterile dressings were then applied. The patient tolerated the procedure well. At the end of the case all needle sponge and instrument counts were correct. The patient was then awakened and taken recovery in stable condition. The assistant was crucial for completion of the case.   PLAN OF CARE: Admit to inpatient    PATIENT DISPOSITION: PACU - hemodynamically stable.  Delay start of Pharmacological VTE agent (>24hrs) due to surgical blood loss or risk of bleeding: no     Electronically signed by Jovita Kussmaul, MD at 05/08/2021 12:53 PM   PATHOLOGY:  Located in [place]' section of Epic EMR chart  SURGICAL PATHOLOGY  CASE: [id number]  PATIENT: [facility]  Surgical Pathology Report   Clinical History: Volvulus of sigmoid (crm)   FINAL MICROSCOPIC DIAGNOSIS:   A. COLON, SIGMOID, RESECTION:  - Segment of colon (31 cm) with mild congestion  - Margins appear viable   Dereona Kolodny DESCRIPTION:   Received fresh is a 31 cm segment of colon, clinically sigmoid colon,  oriented with a suture at the proximal margin.  The segment is dilated  measuring up to 8 cm in diameter at the distal end.  The proximal margin  measures 3 cm in diameter.  The serosa is smooth, tan to hyperemic.  An  obstruction is not grossly identified.  The mucosa is glistening,  edematous and tan with flattening of  the intestinal folds at the dilated  area.  There are no discrete masses.  Sections are submitted in 4  cassettes.  1 = proximal margin  2 = distal margin  3, 4 = sections dilated area (GRP 05/10/2021)   Final Diagnosis performed by Jaquita Folds, MD.   Electronically  signed 05/11/2021  Technical and / or Professional components performed at [facility], [place] [address] [place], [place] [address].   Immunohistochemistry Technical component (if applicable) was performed  at [facility]. [address] [place],  [place],  [address].    IMMUNOHISTOCHEMISTRY DISCLAIMER (if applicable):  Some of these immunohistochemical stains may have been developed and the  performance characteristics determine by [facility]. Some  may not have been cleared or approved by the U.S. Food and Drug  Administration. The FDA has determined that such clearance or approval  is not necessary. This test is used for clinical purposes. It should not  be regarded as investigational or for research. This laboratory is  certified under the [place]  (CLIA-88) as qualified to perform high complexity clinical laboratory  testing.  The controls stained appropriately.  Physical Examination:   Constitutional: Not cachectic. Hygeine adequate. Vitals signs as above.  Eyes: Normal extraocular movements. Sclera nonicteric Neuro: No major focal sensory defects. No major motor deficits. Psych: No severe agitation. No psychosis. No delirium no severe anxiety. Judgment & insight Adequate, Oriented x4, HENT: Normocephalic, Mucus membranes moist. No thrush.  Neck: Supple, No tracheal deviation. No obvious thyromegaly Chest: No pain to chest wall compression. Good respiratory excursion. No audible wheezing CV: Regular rhythm. No major extremity edema  Abdomen: Obese Hernia: Not present. Diastasis recti: Not present. Soft. Nondistended. Nontender. Incisions Clean & dry with normal healing ridge  Gen: Inguinal hernia: Not present. Inguinal lymph nodes: without lymphadenopathy.   Rectal: Perianal skin clear. Normal sphincter tone. Mildly enlarged hemorrhoids. No fissure or fistula nor abscess  Ext: No obvious deformity or contracture. Edema: Not present. No cyanosis Skin: Warm and dry Musculoskeletal: Severe joint rigidity not present.   Assessment and Plan:   Brandon [name] is a 75 y.o. male recovering s/p emergent laparotomy and sigmoid colectomy with Hartmann/colostomy for recurrent sigmoid  volvulus..  Diagnoses and all orders for this visit:  Volvulus of sigmoid colon (CMS-HCC)  Does mobilize using walker  Undifferentiated schizophrenia (CMS-HCC)  Chronic constipation  Other orders - polyethylene glycol (MIRALAX) powder; Take 233.75 g by mouth once for 1 dose Take according to your procedure prep instructions. - bisacodyL (DULCOLAX) 5 mg EC tablet; Take 4 tablets (20 mg total) by mouth once daily as needed for Constipation for up to 1 dose - metroNIDAZOLE (FLAGYL) 500 MG tablet; Take 2 tablets (1,000 mg total) by mouth 3 (three) times daily for 3 doses SEE BOWEL PREP INSTRUCTIONS: Take 2 tablets at 2pm, 3pm, and 10pm the day prior to your colon operation. - neomycin 500 mg tablet; Take 2 tablets (1,000 mg total) by mouth 3 (three) times daily for 3 doses SEE BOWEL PREP INSTRUCTIONS: Take 2 tablets at 2pm, 3pm, and 10pm the day prior to your colon operation.   Assessment/plan:   Pleasant gentleman schizophrenia and chronic kidney disease recovered status post urgent open sigmoid colectomy and colostomy for recurrent sigmoid volvulus July 2022.  He is a reasonable candidate for colostomy takedown. Minimally invasive robotic approach.   The anatomy & physiology of the digestive tract was discussed. The pathophysiology  was discussed. Possibility of remaining with an ostomy permanently was discussed. I offered ostomy takedown. Laparoscopic & open techniques were discussed.   Risks such as bleeding, infection, abscess, leak, reoperation, possible re-ostomy, injury to other organs, need for repair of tissues / organs, need for further treatment, hernia, heart attack, death, and other risks were discussed. I noted a good likelihood this will help address the problem. Goals of post-operative recovery were discussed as well. We will work to minimize complications. Questions were answered. The patient expresses understanding & wishes to proceed with surgery.   Patient was seen and  cleared by cardiology and felt to be acceptable risk, especially in light of him tolerating or more emergent colon surgery.  I do not think he needs cystoscopy and firefly since he did not have diverticulitis or radiation or cancer.  Return for Plan to schedule surgery. See instructions.  The plan was discussed in detail with the patient along with his sister and wife today, who expressed understanding & appreciation. They have my contact information, and understand to call me with any additional questions or concerns in the interval. I would be happy to see the patient back sooner if the need arises.    Ardeth Sportsman, MD, FACS, MASCRS Esophageal, Gastrointestinal & Colorectal Surgery Robotic and Minimally Invasive Surgery  [facility], [facility]  [facility]  [address], [address] [place], [place] [address] [phone] Fax [phone] [address]  CONTACT INFORMATION:  Weekday (9AM-5PM): Call [facility] main office at [phone]  Weeknight (5PM-9AM) or Weekend/Holiday: Check [email or web address] (password " [email or web address]") for General Surgery [facility] coverage  (Please, do not use SecureChat as it is not reliable communication to operating surgeons for immediate patient care)    09/23/2021

## 2021-09-23 NOTE — Discharge Instructions (Signed)
SURGERY: POST OP INSTRUCTIONS °(Surgery for small bowel obstruction, colon resection, etc) ° ° °###################################################################### ° °EAT °Gradually transition to a high fiber diet with a fiber supplement over the next few days after discharge ° °WALK °Walk an hour a day.  Control your pain to do that.   ° °CONTROL PAIN °Control pain so that you can walk, sleep, tolerate sneezing/coughing, go up/down stairs. ° °HAVE A BOWEL MOVEMENT DAILY °Keep your bowels regular to avoid problems.  OK to try a laxative to override constipation.  OK to use an antidairrheal to slow down diarrhea.  Call if not better after 2 tries ° °CALL IF YOU HAVE PROBLEMS/CONCERNS °Call if you are still struggling despite following these instructions. °Call if you have concerns not answered by these instructions ° °###################################################################### ° ° °DIET °Follow a light diet the first few days at home.  Start with a bland diet such as soups, liquids, starchy foods, low fat foods, etc.  If you feel full, bloated, or constipated, stay on a ful liquid or pureed/blenderized diet for a few days until you feel better and no longer constipated. °Be sure to drink plenty of fluids every day to avoid getting dehydrated (feeling dizzy, not urinating, etc.). °Gradually add a fiber supplement to your diet over the next week.  Gradually get back to a regular solid diet.  Avoid fast food or heavy meals the first week as you are more likely to get nauseated. °It is expected for your digestive tract to need a few months to get back to normal.  It is common for your bowel movements and stools to be irregular.  You will have occasional bloating and cramping that should eventually fade away.  Until you are eating solid food normally, off all pain medications, and back to regular activities; your bowels will not be normal. °Focus on eating a low-fat, high fiber diet the rest of your life  (See Getting to Good Bowel Health, below). ° °CARE of your INCISION or WOUND °It is good for closed incision and even open wounds to be washed every day.  Shower every day.  Short baths are fine.  Wash the incisions and wounds clean with soap & water.    ° °If you have a closed incision(s), wash the incision with soap & water every day.  You may leave closed incisions open to air if it is dry.   You may cover the incision with clean gauze & replace it after your daily shower for comfort. ° °It is good for closed incisions and even open wounds to be washed every day.  Shower every day.  Short baths are fine.  Wash the incisions and wounds clean with soap & water.    °You may leave closed incisions open to air if it is dry.   You may cover the incision with clean gauze & replace it after your daily shower for comfort. ° °TEGADERM:  You have clear gauze band-aid dressings over your closed incision(s).  Remove the dressings 3 days after surgery. ° ° °If you have an open wound with a wound vac, see wound vac care instructions. ° ° ° ° °ACTIVITIES as tolerated °Start light daily activities --- self-care, walking, climbing stairs-- beginning the day after surgery.  Gradually increase activities as tolerated.  Control your pain to be active.  Stop when you are tired.  Ideally, walk several times a day, eventually an hour a day.   °Most people are back to most day-to-day activities in   a few weeks.  It takes 4-8 weeks to get back to unrestricted, intense activity. °If you can walk 30 minutes without difficulty, it is safe to try more intense activity such as jogging, treadmill, bicycling, low-impact aerobics, swimming, etc. °Save the most intensive and strenuous activity for last (Usually 4-8 weeks after surgery) such as sit-ups, heavy lifting, contact sports, etc.  Refrain from any intense heavy lifting or straining until you are off narcotics for pain control.  You will have off days, but things should improve  week-by-week. °DO NOT PUSH THROUGH PAIN.  Let pain be your guide: If it hurts to do something, don't do it.  Pain is your body warning you to avoid that activity for another week until the pain goes down. °You may drive when you are no longer taking narcotic prescription pain medication, you can comfortably wear a seatbelt, and you can safely make sudden turns/stops to protect yourself without hesitating due to pain. °You may have sexual intercourse when it is comfortable. If it hurts to do something, stop. ° °MEDICATIONS °Take your usually prescribed home medications unless otherwise directed.   °Blood thinners:  °Usually you can restart any strong blood thinners after the second postoperative day.  It is OK to take aspirin right away.    ° If you are on strong blood thinners (warfarin/Coumadin, Plavix, Xerelto, Eliquis, Pradaxa, etc), discuss with your surgeon, medicine PCP, and/or cardiologist for instructions on when to restart the blood thinner & if blood monitoring is needed (PT/INR blood check, etc).   ° ° °PAIN CONTROL °Pain after surgery or related to activity is often due to strain/injury to muscle, tendon, nerves and/or incisions.  This pain is usually short-term and will improve in a few months.  °To help speed the process of healing and to get back to regular activity more quickly, DO THE FOLLOWING THINGS TOGETHER: °Increase activity gradually.  DO NOT PUSH THROUGH PAIN °Use Ice and/or Heat °Try Gentle Massage and/or Stretching °Take over the counter pain medication °Take Narcotic prescription pain medication for more severe pain ° °Good pain control = faster recovery.  It is better to take more medicine to be more active than to stay in bed all day to avoid medications. ° Increase activity gradually °Avoid heavy lifting at first, then increase to lifting as tolerated over the next 6 weeks. °Do not “push through” the pain.  Listen to your body and avoid positions and maneuvers than reproduce the pain.   Wait a few days before trying something more intense °Walking an hour a day is encouraged to help your body recover faster and more safely.  Start slowly and stop when getting sore.  If you can walk 30 minutes without stopping or pain, you can try more intense activity (running, jogging, aerobics, cycling, swimming, treadmill, sex, sports, weightlifting, etc.) °Remember: If it hurts to do it, then don’t do it! °Use Ice and/or Heat °You will have swelling and bruising around the incisions.  This will take several weeks to resolve. °Ice packs or heating pads (6-8 times a day, 30-60 minutes at a time) will help sooth soreness & bruising. °Some people prefer to use ice alone, heat alone, or alternate between ice & heat.  Experiment and see what works best for you.  Consider trying ice for the first few days to help decrease swelling and bruising; then, switch to heat to help relax sore spots and speed recovery. °Shower every day.  Short baths are fine.  It feels good!    Keep the incisions and wounds clean with soap & water.   °Try Gentle Massage and/or Stretching °Massage at the area of pain many times a day °Stop if you feel pain - do not overdo it °Take over the counter pain medication °This helps the muscle and nerve tissues become less irritable and calm down faster °Choose ONE of the following over-the-counter anti-inflammatory medications: °Acetaminophen 500mg tabs (Tylenol) 1-2 pills with every meal and just before bedtime (avoid if you have liver problems or if you have acetaminophen in you narcotic prescription) °Naproxen 220mg tabs (ex. Aleve, Naprosyn) 1-2 pills twice a day (avoid if you have kidney, stomach, IBD, or bleeding problems) °Ibuprofen 200mg tabs (ex. Advil, Motrin) 3-4 pills with every meal and just before bedtime (avoid if you have kidney, stomach, IBD, or bleeding problems) °Take with food/snack several times a day as directed for at least 2 weeks to help keep pain / soreness down & more  manageable. °Take Narcotic prescription pain medication for more severe pain °A prescription for strong pain control is often given to you upon discharge (for example: oxycodone/Percocet, hydrocodone/Norco/Vicodin, or tramadol/Ultram) °Take your pain medication as prescribed. °Be mindful that most narcotic prescriptions contain Tylenol (acetaminophen) as well - avoid taking too much Tylenol. °If you are having problems/concerns with the prescription medicine (does not control pain, nausea, vomiting, rash, itching, etc.), please call us [phone] to see if we need to switch you to a different pain medicine that will work better for you and/or control your side effects better. °If you need a refill on your pain medication, you must call the office before 4 pm and on weekdays only.  By federal law, prescriptions for narcotics cannot be called into a pharmacy.  They must be filled out on paper & picked up from our office by the patient or authorized caretaker.  Prescriptions cannot be filled after 4 pm nor on weekends.   ° °WHEN TO CALL US [phone] °Severe uncontrolled or worsening pain  °Fever over 101 F (38.5 C) °Concerns with the incision: Worsening pain, redness, rash/hives, swelling, bleeding, or drainage °Reactions / problems with new medications (itching, rash, hives, nausea, etc.) °Nausea and/or vomiting °Difficulty urinating °Difficulty breathing °Worsening fatigue, dizziness, lightheadedness, blurred vision °Other concerns °If you are not getting better after two weeks or are noticing you are getting worse, contact our office [phone] for further advice.  We may need to adjust your medications, re-evaluate you in the office, send you to the emergency room, or see what other things we can do to help. °The clinic staff is available to answer your questions during regular business hours (8:30am-5pm).  Please don’t hesitate to call and ask to speak to one of our nurses for clinical concerns.    °A  surgeon from [facility] is always on call at the hospitals 24 hours/day °If you have a medical emergency, go to the nearest emergency room or call 911. ° °FOLLOW UP in our office °One the day of your discharge from the hospital (or the next business weekday), please call [facility] to set up or confirm an appointment to see your surgeon in the office for a follow-up appointment.  Usually it is 2-3 weeks after your surgery.   °If you have skin staples at your incision(s), let the office know so we can set up a time in the office for the nurse to remove them (usually around 10 days after surgery). °Make sure that you call for   appointments the day of discharge (or the next business weekday) from the hospital to ensure a convenient appointment time. °IF YOU HAVE DISABILITY OR FAMILY LEAVE FORMS, BRING THEM TO THE OFFICE FOR PROCESSING.  DO NOT GIVE THEM TO YOUR DOCTOR. ° °[facility] °[address], [address], , [place]  [address] ? °[phone] - Main °[phone] - Toll Free,  [phone] - Fax °[email or web address] ° °GETTING TO GOOD BOWEL HEALTH. °It is expected for your digestive tract to need a few months to get back to normal.  It is common for your bowel movements and stools to be irregular.  You will have occasional bloating and cramping that should eventually fade away.  Until you are eating solid food normally, off all pain medications, and back to regular activities; your bowels will not be normal.   °Avoiding constipation °The goal: ONE SOFT BOWEL MOVEMENT A DAY!    °Drink plenty of fluids.  Choose water first. °TAKE A FIBER SUPPLEMENT EVERY DAY THE REST OF YOUR LIFE °During your first week back home, gradually add back a fiber supplement every day °Experiment which form you can tolerate.   There are many forms such as powders, tablets, wafers, gummies, etc °Psyllium bran (Metamucil), methylcellulose (Citrucel), Miralax or Glycolax,  Benefiber, Flax Seed.  °Adjust the dose week-by-week (1/2 dose/day to 6 doses a day) until you are moving your bowels 1-2 times a day.  Cut back the dose or try a different fiber product if it is giving you problems such as diarrhea or bloating. °Sometimes a laxative is needed to help jump-start bowels if constipated until the fiber supplement can help regulate your bowels.  If you are tolerating eating & you are farting, it is okay to try a gentle laxative such as double dose MiraLax, prune juice, or Milk of Magnesia.  Avoid using laxatives too often. °Stool softeners can sometimes help counteract the constipating effects of narcotic pain medicines.  It can also cause diarrhea, so avoid using for too long. °If you are still constipated despite taking fiber daily, eating solids, and a few doses of laxatives, call our office. °Controlling diarrhea °Try drinking liquids and eating bland foods for a few days to avoid stressing your intestines further. °Avoid dairy products (especially milk & ice cream) for a short time.  The intestines often can lose the ability to digest lactose when stressed. °Avoid foods that cause gassiness or bloating.  Typical foods include beans and other legumes, cabbage, broccoli, and dairy foods.  Avoid greasy, spicy, fast foods.  Every person has some sensitivity to other foods, so listen to your body and avoid those foods that trigger problems for you. °Probiotics (such as active yogurt, Align, etc) may help repopulate the intestines and colon with normal bacteria and calm down a sensitive digestive tract °Adding a fiber supplement gradually can help thicken stools by absorbing excess fluid and retrain the intestines to act more normally.  Slowly increase the dose over a few weeks.  Too much fiber too soon can backfire and cause cramping & bloating. °It is okay to try and slow down diarrhea with a few doses of antidiarrheal medicines.   °Bismuth subsalicylate (ex. Kayopectate, Pepto Bismol)  for a few doses can help control diarrhea.  Avoid if pregnant.   °Loperamide (Imodium) can slow down diarrhea.  Start with one tablet (2mg) first.  Avoid if you are having fevers or severe pain.  °ILEOSTOMY PATIENTS WILL HAVE CHRONIC DIARRHEA since their colon is   not in use.    °Drink plenty of liquids.  You will need to drink even more glasses of water/liquid a day to avoid getting dehydrated. °Record output from your ileostomy.  Expect to empty the bag every 3-4 hours at first.  Most people with a permanent ileostomy empty their bag 4-6 times at the least.   °Use antidiarrheal medicine (especially Imodium) several times a day to avoid getting dehydrated.  Start with a dose at bedtime & breakfast.  Adjust up or down as needed.  Increase antidiarrheal medications as directed to avoid emptying the bag more than 8 times a day (every 3 hours). °Work with your wound ostomy nurse to learn care for your ostomy.  See ostomy care instructions. °TROUBLESHOOTING IRREGULAR BOWELS °1) Start with a soft & bland diet. No spicy, greasy, or fried foods.  °2) Avoid gluten/wheat or dairy products from diet to see if symptoms improve. °3) Miralax 17gm or flax seed mixed in 8oz. water or juice-daily. May use 2-4 times a day as needed. °4) Gas-X, Phazyme, etc. as needed for gas & bloating.  °5) Prilosec (omeprazole) over-the-counter as needed °6)  Consider probiotics (Align, Activa, etc) to help calm the bowels down ° °Call your doctor if you are getting worse or not getting better.  Sometimes further testing (cultures, endoscopy, X-ray studies, CT scans, bloodwork, etc.) may be needed to help diagnose and treat the cause of the diarrhea. °[facility] °[address], [address], [place], [place]  [address] °[phone] - Main.    °[phone]  - Toll Free.   [phone] - Fax °[email or web address] ° ° °######################################################## ° ° ° °

## 2021-09-23 NOTE — Telephone Encounter (Signed)
Recall has been entered as ordered  

## 2021-09-23 NOTE — Interval H&P Note (Signed)
History and Physical Interval Note:  09/23/2021 11:09 AM  Brandon [name]  has presented today for surgery, with the diagnosis of COLOSTOMY FOR COLON RESECTION. DESIRE FOR OSTOMY.  The various methods of treatment have been discussed with the patient and family. After consideration of risks, benefits and other options for treatment, the patient has consented to  Procedure(s): XI ROBOTIC ASSISTED COLOSTOMY TAKEDOWN (N/A) LYSIS OF ADHESION (N/A) RIGID PROCTOSCOPY (N/A) as a surgical intervention.  The patient's history has been reviewed, patient examined, no change in status, stable for surgery.  I have reviewed the patient's chart and labs.  Questions were answered to the patient's satisfaction.    I have re-reviewed the the patient's records, history, medications, and allergies.  I have re-examined the patient.  I again discussed intraoperative plans and goals of post-operative recovery.  The patient agrees to proceed.  Brandon [name]  Jul 05, 1946 [id number]  Patient Care Team: [name], DO as PCP - General ([place]) [name], MD as PCP - Cardiology (Cardiology) Milus Banister, MD as Attending Physician (Gastroenterology) Michael Boston, MD as Consulting Physician (Colon and Rectal Surgery) Jovita Kussmaul, MD as Consulting Physician (General Surgery) [name], MD as Consulting Physician (Cardiology) Claudia Desanctis, MD as Consulting Physician (Nephrology)  Patient Active Problem List   Diagnosis Date Noted   Sinus tachycardia 08/27/2021   Preoperative cardiovascular examination 08/26/2021   Stage 3a chronic kidney disease ([place]) 08/26/2021   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   Essential hypertension 05/06/2021   Schizophrenia ([place]) 05/06/2021   Osteoarthritis of left knee 07/08/2020   Pain due to onychomycosis of toenails of both feet 10/09/2019   Cough 03/28/2019   Laryngopharyngeal reflux (LPR) 03/28/2019   Presbycusis of both ears 03/28/2019    Chronic venous insufficiency 05/10/2017   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[place]) 09/23/2015    Past Medical History:  Diagnosis Date   Allergy    Arthritis    Bilateral knee swelling    Glaucoma    Hypertension    Prostate disorder    Schizophrenia ([place])    Weakness of lower extremity    left knee, uses walker    Past Surgical History:  Procedure Laterality Date   [place]   had broken right ankle/had 3 surgeries in 1996/ has pins and rods   BOWEL DECOMPRESSION N/A 05/07/2021   Procedure: BOWEL DECOMPRESSION;  Surgeon: Irving Copas., MD;  Location: [facility];  Service: Gastroenterology;  Laterality: N/A;   BOWEL DECOMPRESSION N/A 05/06/2021   Procedure: BOWEL DECOMPRESSION;  Surgeon: Milus Banister, MD;  Location: [facility];  Service: Endoscopy;  Laterality: N/A;   COLONOSCOPY WITH PROPOFOL N/A 05/06/2021   Procedure: COLONOSCOPY WITH PROPOFOL;  Surgeon: Milus Banister, MD;  Location: [facility];  Service: Endoscopy;  Laterality: N/A;   FLEXIBLE SIGMOIDOSCOPY N/A 05/07/2021   Procedure: FLEXIBLE SIGMOIDOSCOPY;  Surgeon: Rush Landmark Telford Nab., MD;  Location: [facility];  Service: Gastroenterology;  Laterality: N/A;   LAPAROTOMY N/A 05/08/2021   Procedure: EXPLORATORY LAPAROTOMY, SIGMOID COLECTOMY, COLOSTOMY;  Surgeon: Jovita Kussmaul, MD;  Location: [facility];  Service: General;  Laterality: N/A;    Social History   Socioeconomic History   Marital status: Single    Spouse name: Not on file   Number of children: Not on file   Years of education: Not on file   Highest education level: Not on file  Occupational History   Not on file  Tobacco Use   Smoking status: Never   Smokeless tobacco: Never  Vaping Use   Vaping Use: Never used  Substance and Sexual Activity   Alcohol use: No   Drug use: No   Sexual activity: Not on file  Other Topics Concern   Not on file  Social History Narrative   Not on file    Social Determinants of Health   Financial Resource Strain: Not on file  Food Insecurity: Not on file  Transportation Needs: Not on file  Physical Activity: Not on file  Stress: Not on file  Social Connections: Not on file  Intimate Partner Violence: Not on file    Family History  Problem Relation Age of Onset   Kidney disease Mother    Breast cancer Mother    Breast cancer Father    Leukemia Father     Medications Prior to Admission  Medication Sig Dispense Refill Last Dose   acetaminophen (TYLENOL) 650 MG CR tablet Take 650 mg by mouth every 8 (eight) hours as needed for pain.   Past Week   aspirin EC 81 MG tablet Take 81 mg by mouth daily.   Past Week   buPROPion (WELLBUTRIN SR) 150 MG 12 hr tablet Take 150 mg by mouth daily.   09/23/2021 at 0800   Cholecalciferol (VITAMIN D3) 25 MCG (1000 UT) CAPS Take 2,000 Units by mouth daily.   09/22/2021   Cod Liver Oil 1000 MG CAPS Take 1,000 mg by mouth daily.   09/22/2021   finasteride (PROSCAR) 5 MG tablet Take 5 mg by mouth daily.   09/22/2021   fluticasone (FLONASE) 50 MCG/ACT nasal spray Place 1 spray into both nostrils daily as needed for allergies or rhinitis.   09/22/2021   losartan (COZAAR) 50 MG tablet Take 50 mg by mouth daily.   09/22/2021   Omega-3 Fatty Acids (FISH OIL) 1000 MG CAPS Take 1,000 mg by mouth daily.   09/22/2021   omeprazole (PRILOSEC) 20 MG capsule Take 20 mg by mouth daily.   09/23/2021 at 0800   oxybutynin (DITROPAN-XL) 5 MG 24 hr tablet Take 5 mg by mouth daily.   09/23/2021 at 0800   rosuvastatin (CRESTOR) 5 MG tablet Take 5 mg by mouth at bedtime.   09/22/2021   tamsulosin (FLOMAX) 0.4 MG CAPS capsule Take 0.4 mg by mouth daily.   09/23/2021 at 0800   Travoprost, BAK Free, (TRAVATAN) 0.004 % SOLN ophthalmic solution Place 1 drop into both eyes at bedtime.   09/22/2021   trifluoperazine (STELAZINE) 5 MG tablet Take 5 mg by mouth at bedtime.   09/22/2021    Current Facility-Administered Medications  Medication  Dose Route Frequency Provider Last Rate Last Admin   bupivacaine liposome (EXPAREL) 1.3 % injection 266 mg  20 mL Infiltration Once Michael Boston, MD       cefoTEtan (CEFOTAN) 2 g in sodium chloride 0.9 % 100 mL IVPB  2 g Intravenous On Call to OR Michael Boston, MD       Chlorhexidine Gluconate Cloth 2 % PADS 6 each  6 each Topical Once Michael Boston, MD       And   Chlorhexidine Gluconate Cloth 2 % PADS 6 each  6 each Topical Once Michael Boston, MD       Derrill Memo ON 09/24/2021] feeding supplement (ENSURE PRE-SURGERY) liquid 296 mL  296 mL Oral Once Michael Boston, MD       feeding supplement (ENSURE PRE-SURGERY) liquid 592 mL  592 mL Oral Once  Karie Soda, MD       lactated ringers infusion   Intravenous Continuous Heather Roberts, MD 10 mL/hr at 09/23/21 1031 New Bag at 09/23/21 1031     No Known Allergies  BP (!) 147/80   Pulse (!) 105   Temp 97.9 F (36.6 C) (Oral)   Resp 16   SpO2 99%   Labs: No results found for this or any previous visit (from the past 48 hour(s)).  Imaging / Studies: No results found.   Ardeth Sportsman, M.D., F.A.C.S. Gastrointestinal and Minimally Invasive Surgery [facility] [address] [place], [place] [address] [phone] Main / Paging  09/23/2021 11:09 AM    Ardeth Sportsman

## 2021-09-23 NOTE — Anesthesia Postprocedure Evaluation (Signed)
Anesthesia Post Note  Patient: CHENEY EWART  Procedure(s) Performed: XI ROBOTIC ASSISTED COLOSTOMY TAKEDOWN WITH BILATERAL TAP BLOCK (Abdomen) ROBOTIC LYSIS OF ADHESIONS (Abdomen) RIGID PROCTOSCOPY (Rectum)     Patient location during evaluation: PACU Anesthesia Type: General Level of consciousness: awake Pain management: pain level controlled Vital Signs Assessment: post-procedure vital signs reviewed and stable Respiratory status: spontaneous breathing, respiratory function stable and patient connected to nasal cannula oxygen Cardiovascular status: stable Postop Assessment: no apparent nausea or vomiting Anesthetic complications: no   No notable events documented.  Last Vitals:  Vitals:   09/23/21 1020  BP: (!) 147/80  Pulse: (!) 105  Resp: 16  Temp: 36.6 C  SpO2: 99%    Last Pain:  Vitals:   09/23/21 1020  TempSrc: Oral                 Mellody Dance

## 2021-09-23 NOTE — Anesthesia Procedure Notes (Signed)
Procedure Name: Intubation Date/Time: 09/23/2021 12:51 PM Performed by: Victoriano Lain, CRNA Pre-anesthesia Checklist: Patient identified, Emergency Drugs available, Suction available, Patient being monitored and Timeout performed Patient Re-evaluated:Patient Re-evaluated prior to induction Oxygen Delivery Method: Circle system utilized Preoxygenation: Pre-oxygenation with 100% oxygen Induction Type: IV induction Ventilation: Mask ventilation without difficulty Laryngoscope Size: Mac and 4 Grade View: Grade I Tube type: Oral Tube size: 7.5 mm Number of attempts: 1 Airway Equipment and Method: Stylet Placement Confirmation: ETT inserted through vocal cords under direct vision, positive ETCO2 and breath sounds checked- equal and bilateral Secured at: 22 cm Tube secured with: Tape Dental Injury: Teeth and Oropharynx as per pre-operative assessment

## 2021-09-23 NOTE — Telephone Encounter (Signed)
-----   Message from Rachael Fee, MD sent at 09/23/2021  7:10 AM EST ----- Got it, thanks  Harshita Bernales, Can you make sure he is in recall for repeat colonoscopy in 12 months.  Thanks   ----- Message ----- From: Lemar Lofty., MD Sent: 09/22/2021   4:04 PM EST To: Rachael Fee, MD, Karie Soda, MD  SG and DJ, Colonoscopy completed this afternoon.  Still had a significant amount of in his colon we lavaged significantly to get a fair preparation.  No large masses are noted and no large polyps.  Small polyps less than 6 mm could have been missed.  There was some inflammation at the rectosigmoid stump which was biopsied but looks to be likely diversion. I gave the patient another half prep of MiraLAX so that he can be cleaned out for tomorrow's procedure with you SG. I think a repeat colonoscopy in 6 to 12 months after being put back together is probably reasonable to ensure no other polyps were missed. Thanks. GM

## 2021-09-23 NOTE — Transfer of Care (Signed)
Immediate Anesthesia Transfer of Care Note  Patient: Brandon [name]  Procedure(s) Performed: XI ROBOTIC ASSISTED COLOSTOMY TAKEDOWN WITH BILATERAL TAP BLOCK (Abdomen) ROBOTIC LYSIS OF ADHESIONS (Abdomen) RIGID PROCTOSCOPY (Rectum)  Patient Location: PACU  Anesthesia Type:General  Level of Consciousness: drowsy  Airway & Oxygen Therapy: Patient Spontanous Breathing and Patient connected to face mask  Post-op Assessment: Report given to RN and Post -op Vital signs reviewed and stable  Post vital signs: Reviewed and stable  Last Vitals:  Vitals Value Taken Time  BP 123/83 09/23/21 1522  Temp    Pulse 76 09/23/21 1527  Resp 15 09/23/21 1527  SpO2 100 % 09/23/21 1527  Vitals shown include unvalidated device data.  Last Pain:  Vitals:   09/23/21 1020  TempSrc: Oral         Complications: No notable events documented.

## 2021-09-23 NOTE — Anesthesia Preprocedure Evaluation (Addendum)
Anesthesia Evaluation  Patient identified by MRN, date of birth, ID band Patient awake    Reviewed: Allergy & Precautions, NPO status , Patient's Chart, lab work & pertinent test results  Airway Mallampati: II  TM Distance: >3 FB Neck ROM: Full    Dental no notable dental hx.    Pulmonary neg pulmonary ROS,    Pulmonary exam normal breath sounds clear to auscultation       Cardiovascular Exercise Tolerance: Good hypertension, Pt. on medications  Rhythm:Regular Rate:Tachycardia  EKG from 08/27/21: ST, rate 129   Stress test 09/2020:? Nuclear stress EF: 59%.  There was no ST segment deviation noted during stress.  The study is normal.  This is a low risk study.  The left ventricular ejection fraction is normal (55-65%).   Normal resting and stress perfusion. No ischemia or infarction EF 59%  ECHO:09/2020 1. Left ventricular ejection fraction, by estimation, is 60 to 65%. The left ventricle has normal function. The left ventricle has no regional wall motion abnormalities. Left ventricular diastolic parameters are consistent with Grade I diastolic dysfunction (impaired relaxation). The average left ventricular global longitudinal strain is -18.0 %. The global longitudinal strain is normal. 2. Right ventricular systolic function is normal. The right ventricular size is normal. There is normal pulmonary artery systolic pressure. 3. The mitral valve is normal in structure. Trivial mitral valve regurgitation. 4. The aortic valve is normal in structure. There is mild thickening of the aortic valve. Aortic valve regurgitation is not visualized. No aortic stenosis is present. 5. The inferior vena cava is normal in size with <50% respiratory variability, suggesting right atrial pressure of 8 mmHg.   Neuro/Psych PSYCHIATRIC DISORDERS negative neurological ROS     GI/Hepatic negative GI ROS, Neg liver ROS,   Endo/Other  negative  endocrine ROS  Renal/GU Renal InsufficiencyRenal disease  negative genitourinary   Musculoskeletal  (+) Arthritis ,   Abdominal Normal abdominal exam  (+)   Peds  Hematology  (+) anemia ,   Anesthesia Other Findings   Reproductive/Obstetrics negative OB ROS                            Anesthesia Physical Anesthesia Plan  ASA: 3  Anesthesia Plan: General   Post-op Pain Management: Tylenol PO (pre-op) and Gabapentin PO (pre-op)   Induction: Intravenous  PONV Risk Score and Plan: 2 and Treatment may vary due to age or medical condition  Airway Management Planned: Oral ETT  Additional Equipment: None  Intra-op Plan:   Post-operative Plan: Extubation in OR  Informed Consent: I have reviewed the patients History and Physical, chart, labs and discussed the procedure including the risks, benefits and alternatives for the proposed anesthesia with the patient or authorized representative who has indicated his/her understanding and acceptance.     Dental advisory given  Plan Discussed with: Anesthesiologist and CRNA  Anesthesia Plan Comments:        Anesthesia Quick Evaluation

## 2021-09-23 NOTE — Op Note (Addendum)
09/23/2021  3:08 PM  PATIENT:  Brandon [name]  75 y.o. 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  This assistant was needed for exposure, dissection, suction, tissue approximation, retraction, perception, etc.  ANESTHESIA:   local and general  EBL:  Total I/O In: 1100 [I.V.:1000; IV Piggyback:100] Out: 100 [Blood:100]  Delay start of Pharmacological VTE agent (>24hrs) due to surgical blood loss or risk of bleeding:  no  DRAINS: none   SPECIMEN:  COLOSTOMY  DISPOSITION OF SPECIMEN:  PATHOLOGY  COUNTS:  YES  PLAN OF CARE: Admit to inpatient   PATIENT DISPOSITION:  PACU - hemodynamically stable.  INDICATION: Pleasant patient status post colectomy with end ostomy.  The patient has recovered from that surgery and has understandably requested ostomy takedown.  Medically stabilized and felt reasonable to proceed.   I discussed the procedure with the patient:  The anatomy & physiology of the digestive tract was discussed.  The pathophysiology was  discussed.  Possibility of remaining with an ostomy permanently was discussed.  I offered ostomy takedown.  Laparoscopic & open techniques were discussed.   Risks such as bleeding, infection, abscess, leak, reoperation, possible re-ostomy, injury to other organs, hernia, heart attack, death, and other risks were discussed.   I noted a good likelihood this will help address the problem.  Goals of post-operative recovery were discussed as well.  We will work to minimize complications.  Questions were answered.  The patient expresses understanding & wishes to proceed with surgery.  OR FINDINGS:       Moderate adhesions especially small intestine to anterior abdominal wall.  No definite obstructions.  Very long but not inflamed appendix.  Rectal stump at sacral promontory.  No trimming done.  It is a 31 EEA descending to proximal rectum anastomosis 12cm from anal verge  DESCRIPTION:   Informed consent was confirmed.   The patient received IV antibiotics & underwent general anesthesia without any difficulty.  Foley catheter was sterilely placed. SCDs were active during the entire case.  I sutured the stomy closed using a pursestring  stitch.  The abdomen was prepped and draped in a sterile fashion.  A surgical timeout confirmed our plan.  I placed a port in the upper abdomen using optical entry technique the patient in steep reverse Trendelenburg.  Entry was clean.  I placed extra ports under visualization.  Robot carefully docked proceeded with lysis of adhesions to make sure there were no adhesions on the anterior abdominal wall, to the ostomy, and to the pelvis.  Most dense adhesions were small bowel to the anterior midline abdominal wall.  Rather fibrotic but carefully freed off inspected small bowel loops to make  sure no significant interloop adhesions or transition zones noted.  We ran the small bowel from the ileocecal valve to ligament of Treitz and confirmed no bleeding injury or or serosal repairs  needed.  Good orientation  Inspected the rectal stump.  Did a little mobilization on the peritoneum laterally but otherwise it came up quite well and seemed an appropriate length just distal to the sacral promontory.  Dr. Maisie Fus agreed that no further trimming was needed either.  Therefore the robot was undocked I made a biconcave curvilinear incision transversely around the ostomy.  I got into the subcutaneous tissues.  I used careful focused right angle dissection and sharp dissection.  Some focused cautery dissection as well.  That helped to free adhesions to the subcutaneous tisses & fascia.  I was able to enter into the peritoneum focally.  I did a gentle finger sweep.  Gradually came around circumferentially and freed the bowel from remaining adhesions to the abdominal wall.  We were able eviscerate some bowel proximally and distally.    I clamped the colon at the point of resection using a reusable pursestringer device.  Passed a 2-0 Keith needle. I transected at the descending/sigmoid junction with a scalpel. I got healthy bleeding mucosa.  We sent the rectosigmoid colon specimen off to go to pathology.  We sized the colon orifice.  I chose a 31 EEA anvil stapler system.  I reinforced the prolene pursestring with interrupted silk suture.  I placed the anvil to the open end of the proximal remaining colon and closed around it using the pursestring.    We did copious irrigation with crystalloid solution.  Hemostasis was good.  The distal end of the colon at the handle easily reached down to the rectal stump, therefore, splenic flexure mobilization was not needed.      Dr Maisie Fus scrubbed down and did gentle anal dilation and advanced the EEA stapler up the rectal stump. The spike was brought out at the provimal end of the rectal stump under direct visualization.  I attached the anvil of the proximal colon the spike of the stapler. Anvil was tightened down and held clamped for 60 seconds. The EEA stapler  was fired and held clamped for 30 seconds. The stapler was released & removed. We noted 2 excellent anastomotic rings. Blue stitch is in the proximal ring.    We did a final irrigation of isotonic crystalloid solution held that for the pelvic air leak test.  The rectum was insufflated the rectum while clamping the colon proximal to that anastomosis.  Dr Maisie Fus did rigid proctoscopy noted the anastomosis was at 12 cm from the anal verge consistent with the proximal rectum.  There was a negative air leak test. There was no tension of mesentery or bowel at the anastomosis.   Tissues looked viable.  Hemostasis was good.   Ureters & bowel uninjured.  The anastomosis looked healthy.   We changed gown and gloves.  Sterile unused instruments were used from this point out per colon SSI prevention protocol.  I removed CO2 gas out through the ports.  I closed the 69mm port sites using Monocryl stitch and sterile dressing.  I trimmed the excess skin obliquely and subcutaneous tissues to allow the colostomy wound to close obliquely and flat.  I closed the abdominal wall ostomy wound using #1 PDS vertical running fascial closure. I closed the skin with some interrupted Monocryl stitches. I placed antibiotic-soaked wicks into the closure at the corners x2. I placed  a sterile dressing.    Patient is being extubated go to recovery room. I discussed postop care with the patient in detail the office & in the holding area. Instructions are written. I discussed operative findings, updated the patient's status, discussed probable steps to recovery, and gave postoperative recommendations to the patient's sisters in person.  Recommendations were made.  Questions were answered.  They expressed understanding & appreciation.   Ardeth Sportsman, M.D., F.A.C.S. Gastrointestinal and Minimally Invasive Surgery [facility] [address], [address] [place], [place] [address] [phone] Main /  Paging

## 2021-09-24 ENCOUNTER — Telehealth: Payer: Self-pay

## 2021-09-24 ENCOUNTER — Encounter (HOSPITAL_COMMUNITY): Payer: Self-pay | Admitting: Surgery

## 2021-09-24 ENCOUNTER — Other Ambulatory Visit (HOSPITAL_COMMUNITY): Payer: Self-pay

## 2021-09-24 LAB — CBC
HCT: 32.2 % — ABNORMAL LOW (ref 39.0–52.0)
Hemoglobin: 10.2 g/dL — ABNORMAL LOW (ref 13.0–17.0)
MCH: 27.7 pg (ref 26.0–34.0)
MCHC: 31.7 g/dL (ref 30.0–36.0)
MCV: 87.5 fL (ref 80.0–100.0)
Platelets: 164 10*3/uL (ref 150–400)
RBC: 3.68 MIL/uL — ABNORMAL LOW (ref 4.22–5.81)
RDW: 15.8 % — ABNORMAL HIGH (ref 11.5–15.5)
WBC: 13.7 10*3/uL — ABNORMAL HIGH (ref 4.0–10.5)
nRBC: 0 % (ref 0.0–0.2)

## 2021-09-24 LAB — BASIC METABOLIC PANEL
Anion gap: 8 (ref 5–15)
BUN: 10 mg/dL (ref 8–23)
CO2: 22 mmol/L (ref 22–32)
Calcium: 8.2 mg/dL — ABNORMAL LOW (ref 8.9–10.3)
Chloride: 103 mmol/L (ref 98–111)
Creatinine, Ser: 1.28 mg/dL — ABNORMAL HIGH (ref 0.61–1.24)
GFR, Estimated: 59 mL/min — ABNORMAL LOW (ref >60)
Glucose, Bld: 117 mg/dL — ABNORMAL HIGH (ref 70–99)
Potassium: 4 mmol/L (ref 3.5–5.1)
Sodium: 133 mmol/L — ABNORMAL LOW (ref 135–145)

## 2021-09-24 LAB — MAGNESIUM: Magnesium: 1.8 mg/dL (ref 1.7–2.4)

## 2021-09-24 NOTE — TOC Initial Note (Signed)
Transition of Care [facility]) - Initial/Assessment Note   Patient Details  Name: Brandon [name] MRN: [id number] Date of Birth: 1946/09/24  Transition of Care [facility]) CM/SW Contact:    Sherie Don, LCSW Phone Number: 09/24/2021, 2:50 PM  Clinical Narrative: Readmission checklist completed due to high readmission score. CSW met with patient to complete assessment. Per patient, he resides at home with his sister. Patient reported no issues with affording medications and stated he takes these as prescribed. Patient is independent with ADLs at baseline. Current DME includes a rolling walker, which he uses outside the home, and a cane that he uses around the home. Patient is not currently active with home health, nor does he seem to need it at this time. Sister provides transportation to medical appointments. TOC to follow for possible discharge needs.  Expected Discharge Plan: Home/Self Care Barriers to Discharge: Continued Medical Work up  Patient Goals and CMS Choice Patient states their goals for this hospitalization and ongoing recovery are:: Return home Choice offered to / list presented to : NA  Expected Discharge Plan and Services Expected Discharge Plan: Home/Self Care In-house Referral: Clinical Social Work Post Acute Care Choice: NA Living arrangements for the past 2 months: Single Family Home            DME Arranged: N/A DME Agency: NA  Prior Living Arrangements/Services Living arrangements for the past 2 months: Single Family Home Lives with:: Siblings Patient language and need for interpreter reviewed:: Yes Do you feel safe going back to the place where you live?: Yes      Need for Family Participation in Patient Care: No (Comment) Care giver support system in place?: Yes (comment) Current home services: DME (Rolling walker, cane) Criminal Activity/Legal Involvement Pertinent to Current Situation/Hospitalization: No - Comment as needed  Emotional Assessment Appearance::  Appears stated age Attitude/Demeanor/Rapport: Engaged Affect (typically observed): Accepting Orientation: : Oriented to Self, Oriented to Place, Oriented to  Time, Oriented to Situation Alcohol / Substance Use: Not Applicable  Admission diagnosis:  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] Patient Active Problem List   Diagnosis Date Noted   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   Sinus tachycardia 08/27/2021   Preoperative cardiovascular examination 08/26/2021   Stage 3a chronic kidney disease ([place]) 08/26/2021   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   Essential hypertension 05/06/2021   Schizophrenia ([place]) 05/06/2021   Osteoarthritis of left knee 07/08/2020   Pain due to onychomycosis of toenails of both feet 10/09/2019   Cough 03/28/2019   Laryngopharyngeal reflux (LPR) 03/28/2019   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

## 2021-09-24 NOTE — Telephone Encounter (Signed)
  Follow up Call-  Call back number 09/22/2021  Post procedure Call Back phone  # [phone]  Permission to leave phone message Yes  Some recent data might be hidden     Patient questions:  Do you have a fever, pain , or abdominal swelling? No. Pain Score  0 *  Have you tolerated food without any problems? Yes.    Have you been able to return to your normal activities? Yes.    Do you have any questions about your discharge instructions: Diet   No. Medications  No. Follow up visit  No.  Do you have questions or concerns about your Care? No.  Actions: * If pain score is 4 or above: No action needed, pain <4.

## 2021-09-24 NOTE — Progress Notes (Signed)
Brandon [name] [id number] 29-Dec-1945  CARE TEAM:  PCP: [name], DO  Outpatient Care Team: Patient Care Team: [name], DO as PCP - General (General Practice) [name], MD as PCP - Cardiology (Cardiology) Milus Banister, MD as Attending Physician (Gastroenterology) Michael Boston, MD as Consulting Physician (Colon and Rectal Surgery) Jovita Kussmaul, MD as Consulting Physician (General Surgery) [name], MD as Consulting Physician (Cardiology) Claudia Desanctis, MD as Consulting Physician (Nephrology)  Inpatient Treatment Team: Treatment Team: Attending Provider: Michael Boston, MD; Technician: Towns, Narda Amber, [place]; Registered Nurse: Dorinda Hill, RN; Pharmacist: Eudelia Bunch, [facility]   Problem List:   Principal Problem:   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[facility]) Active Problems:   Schizophrenia ([place])   Chronic venous insufficiency   Laryngopharyngeal reflux (LPR)   Stage 3a chronic kidney disease ([place])   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  No definite obstructions.  Very long but not inflamed appendix.  Rectal stump at sacral promontory.  No trimming done.  It is a 31 EEA descending to proximal rectum anastomosis 12 cm from anal verge      Assessment  Recovering so far  [facility] Stay = 1 days)  Plan:  -ERAS. -Advance diet -DC foley -VTE prophylaxis- SCDs, etc -mobilize as tolerated to help recovery.  He struggles with limited mobility with strictures and chronic venous insufficiency.  Ask physical and Occupational Therapy for evaluation  as suspect he would benefit from some home health at least.  Hopefully can avoid SNF  Disposition:  Disposition:  The patient is from: Home  Anticipate discharge to:  Home with Home Health  Anticipated Date of Discharge is:  December 11,2022    Barriers to discharge:  Pending Clinical improvement (more likely than not)  Patient currently is NOT MEDICALLY STABLE for discharge from the hospital from a surgery standpoint.      25 minutes spent in review, evaluation, examination, counseling, and coordination of care.   I have reviewed this patient's available data, including medical history, events of note, physical examination and test results as part of my evaluation.  A significant portion of that time was spent in counseling.  Care during the described time interval was provided by me.  09/24/2021    Subjective: (Chief complaint)  Patient denies much pain.  Tolerating liquids.  Nursing just outside room  Objective:  Vital signs:  Vitals:   09/23/21 2055 09/24/21 0106 09/24/21 0500 09/24/21 0510  BP:  131/80  128/67  Pulse: 85 99  96  Resp:  18  15  Temp:    (!) 97.5 F (36.4 C)  TempSrc:    Oral  SpO2: 100% 100%  100%  Weight:   91.9 kg   Height:           Intake/Output   Yesterday:  12/08 0701 - 12/09 0700 In: 1749.3 [P.O.:240; I.V.:1409.3; IV Piggyback:100] Out: 3700 [Urine:3600; Blood:100] This shift:  No intake/output data recorded.  Bowel function:  Flatus: YES  BM:  No  Drain: (No drain)   Physical Exam:  General: Pt awake/alert  in no acute distress Eyes: PERRL, normal EOM.  Sclera clear.  No icterus Neuro: CN II-XII intact w/o focal sensory/motor deficits. Lymph: No head/neck/groin lymphadenopathy Psych:  No delerium/psychosis/paranoia.  Oriented x 4 HENT: Normocephalic, Mucus membranes moist.  No thrush Neck: Supple, No tracheal deviation.  No obvious thyromegaly Chest: No pain to chest wall compression.  Good respiratory excursion.   No audible wheezing CV:  Pulses intact.  Regular rhythm.  No major extremity edema MS: Normal AROM mjr joints.  No obvious deformity  Abdomen: Soft.  Nondistended.  Mildly tender at incisions only.  No evidence of peritonitis.  No incarcerated hernias.  Ext:   No deformity.  No mjr edema.  No cyanosis Skin: No petechiae / purpurea.  No major sores.  Warm and dry    Results:   Cultures: Recent Results (from the past 720 hour(s))  SARS Coronavirus 2 (TAT 6-24 hrs)     Status: None   Collection Time: 09/21/21 12:00 AM  Result Value Ref Range Status   SARS Coronavirus 2 RESULT: NEGATIVE  Corrected    Comment: RESULT: NEGATIVESARS-CoV-2 INTERPRETATION:A NEGATIVE  test result means that SARS-CoV-2 RNA was not present in the specimen above the limit of detection of this test. This does not preclude a possible SARS-CoV-2 infection and should not be used as the  sole basis for patient management decisions. Negative results must be combined with clinical observations, patient history, and epidemiological information. Optimum specimen types and timing for peak viral levels during infections caused by SARS-CoV-2  have not been determined. Collection of multiple specimens or types of specimens may be necessary to detect virus. Improper specimen collection and handling, sequence variability under primers/probes, or organism present below the limit of detection may  lead to false negative results. Positive and negative predictive values of testing are highly dependent on prevalence. False negative test results are more likely when prevalence of disease is high.The expected result is NEGATIVE.Fact S heet for  Healthcare Providers: CollegeCustoms.glhttps://www.fda.gov/media/136111/downloadFact Sheet for Patients: https://poole-freeman.org/https://www.fda.gov/media/136114/downloadNormal Reference Range - Negative     Labs: Results for orders placed or performed during the hospital encounter of 09/23/21 (from the past 48 hour(s))  Basic metabolic  panel     Status: Abnormal   Collection Time: 09/24/21  5:06 AM  Result Value Ref Range   Sodium 133 (L) 135 - 145 mmol/L   Potassium 4.0 3.5 - 5.1 mmol/L   Chloride 103 98 - 111 mmol/L   CO2 22 22 - 32 mmol/L   Glucose, Bld 117 (H) 70 - 99 mg/dL    Comment: Glucose reference range applies only to samples taken after fasting for at least 8 hours.   BUN 10 8 - 23 mg/dL   Creatinine, Ser 1.611.28 (H) 0.61 - 1.24 mg/dL   Calcium 8.2 (L) 8.9 - 10.3 mg/dL   GFR, Estimated 59 (L) >60 mL/min    Comment: (NOTE) Calculated using the CKD-EPI Creatinine Equation (2021)    Anion gap 8 5 - 15    Comment: Performed at [facility], [address] [place], [place] [id number]  CBC     Status: Abnormal   Collection Time: 09/24/21  5:06 AM  Result Value Ref Range   WBC 13.7 (H) 4.0 - 10.5 K/uL   RBC 3.68 (L) 4.22 - 5.81 MIL/uL   Hemoglobin 10.2 (L) 13.0 - 17.0 g/dL   HCT 54.032.2 (L) 98.139.0 - 19.152.0 %   MCV 87.5 80.0 - 100.0 fL   MCH 27.7 26.0 -  34.0 pg   MCHC 31.7 30.0 - 36.0 g/dL   RDW 26.7 (H) 12.4 - 58.0 %   Platelets 164 150 - 400 K/uL   nRBC 0.0 0.0 - 0.2 %    Comment: Performed at [facility], [address], [place], [place] [address]  Magnesium     Status: None   Collection Time: 09/24/21  5:06 AM  Result Value Ref Range   Magnesium 1.8 1.7 - 2.4 mg/dL    Comment: Performed at [facility], [address] [place], [place] [address]    Imaging / Studies: No results found.  Medications / Allergies: per chart  Antibiotics: Anti-infectives (From admission, onward)    Start     Dose/Rate Route Frequency Ordered Stop   09/24/21 0100  cefoTEtan (CEFOTAN) 2 g in sodium chloride 0.9 % 100 mL IVPB        2 g 200 mL/hr over 30 Minutes Intravenous Every 12 hours 09/23/21 1728 09/24/21 0149   09/23/21 1400  neomycin (MYCIFRADIN) tablet 1,000 mg  Status:  Discontinued       See Hyperspace for full Linked Orders Report.   1,000 mg Oral 3  times per day 09/23/21 1015 09/23/21 1017   09/23/21 1400  metroNIDAZOLE (FLAGYL) tablet 1,000 mg  Status:  Discontinued       See Hyperspace for full Linked Orders Report.   1,000 mg Oral 3 times per day 09/23/21 1015 09/23/21 1017   09/23/21 1030  cefoTEtan (CEFOTAN) 2 g in sodium chloride 0.9 % 100 mL IVPB        2 g 200 mL/hr over 30 Minutes Intravenous On call to O.R. 09/23/21 1015 09/23/21 1322         Note: Portions of this report may have been transcribed using voice recognition software. Every effort was made to ensure accuracy; however, inadvertent computerized transcription errors may be present.   Any transcriptional errors that result from this process are unintentional.    Ardeth Sportsman, MD, FACS, MASCRS Esophageal, Gastrointestinal & Colorectal Surgery Robotic and Minimally Invasive Surgery  [facility], [facility]  [facility]  [address] [place], [place] [address] [phone] Fax [phone] Main  CONTACT INFORMATION:  Weekday (9AM-5PM): Call [facility] main office at [phone]  Weeknight (5PM-9AM) or Weekend/Holiday: Check [email or web address] (password " [email or web address]") for General Surgery [facility] coverage  (Please, do not use SecureChat as it is not reliable communication to operating surgeons for immediate patient care)      09/24/2021  7:13 AM

## 2021-09-25 ENCOUNTER — Other Ambulatory Visit (HOSPITAL_COMMUNITY): Payer: Self-pay

## 2021-09-25 NOTE — Progress Notes (Signed)
Brandon [name] [id number] 1946/04/05  CARE TEAM:  PCP: Karl Ito, DO  Outpatient Care Team: Patient Care Team: Karl Ito, DO as PCP - General (General Practice) Meriam Sprague, MD as PCP - Cardiology (Cardiology) Rachael Fee, MD as Attending Physician (Gastroenterology) Karie Soda, MD as Consulting Physician (Colon and Rectal Surgery) Griselda Miner, MD as Consulting Physician (General Surgery) Meriam Sprague, MD as Consulting Physician (Cardiology) Estanislado Emms, MD as Consulting Physician (Nephrology)  Inpatient Treatment Team: Treatment Team: Attending Provider: Karie Soda, MD; Occupational Therapist: Ardyth Harps, OT; Registered Nurse: Diona Browner, RN; Physical Therapist: Caswell Corwin, PT; Utilization Review: Deveron Furlong, RN; Social Worker: Ida Rogue, [place]   Problem List:   Principal Problem:   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[facility]) Active Problems:   Chronic venous insufficiency   Laryngopharyngeal reflux (LPR)   Schizophrenia ([facility])   Stage 3a chronic kidney disease ([facility])   Volvulus of large intestine ([facility])   2 Days Post-Op  09/23/2021  POST-OPERATIVE DIAGNOSIS:   COLOSTOMY FOR COLON RESECTION FOR VOLVULUS DESIRE FOR OSTOMY TAKEDOWN   PROCEDURE:  XI ROBOTIC ASSISTED COLOSTOMY TAKEDOWN  ROBOTIC LYSIS OF ADHESIONS RIGID PROCTOSCOPY TRANSVERSUS ABDOMINIS PLANE (TAP) BLOCK - BILATERAL   SURGEON:  Ardeth Sportsman, MD  OR FINDINGS:        Moderate adhesions especially small intestine to anterior abdominal wall.  No definite obstructions.  Very long but not inflamed appendix.  Rectal stump at sacral promontory.  No trimming done.  It is a 31 EEA descending to proximal rectum anastomosis 12 cm from anal verge      Assessment  Recovering so far  [facility] Stay = 2 days)  Plan:  -ERAS. -Continue observation/supportive care -VTE prophylaxis- SCDs, etc -mobilize as tolerated to help recovery.  He struggles with limited  mobility with strictures and chronic venous insufficiency.  Ask physical and Occupational Therapy for evaluation as suspect he would benefit from some home health at least.  Hopefully can avoid SNF  Disposition:  Disposition:  The patient is from: Home  Anticipate discharge to:  Home with Home Health  Anticipated Date of Discharge is:  December 11,2022    Barriers to discharge:  Pending Clinical improvement (more likely than not)  Patient currently is NOT MEDICALLY STABLE for discharge from the hospital from a surgery standpoint.      25 minutes spent in review, evaluation, examination, counseling, and coordination of care.   I have reviewed this patient's available data, including medical history, events of note, physical examination and test results as part of my evaluation.  A significant portion of that time was spent in counseling.  Care during the described time interval was provided by me.  09/25/2021    Subjective: (Chief complaint)  No complaints.   Objective:  Vital signs:  Vitals:   09/24/21 1258 09/24/21 2243 09/25/21 0223 09/25/21 0602  BP: (!) 100/55 137/67 114/66 126/66  Pulse: 92 (!) 110 (!) 107 (!) 109  Resp: 16 18 16 18   Temp: 98.8 F (37.1 C) 98.3 F (36.8 C) 98.3 F (36.8 C) (!) 97.4 F (36.3 C)  TempSrc:  Oral Oral Oral  SpO2: 100% 99% 100% 99%  Weight:      Height:           Intake/Output   Yesterday:  12/09 0701 - 12/10 0700 In: 1320 [P.O.:1320] Out: 1450 [Urine:1450] This shift:  No intake/output data recorded.  Bowel function:  Flatus: YES  BM:  No  Drain: (No drain)   Physical Exam:  General: Pt awake/alert in no acute distress Eyes: PERRL, normal EOM.  Sclera clear.  No icterus Neuro: CN II-XII intact w/o focal sensory/motor deficits. Lymph: No head/neck/groin lymphadenopathy Psych:  No delerium/psychosis/paranoia.  Oriented x 4 HENT: Normocephalic, Mucus membranes moist.  No thrush Neck: Supple, No tracheal  deviation.  No obvious thyromegaly Chest: No pain to chest wall compression.  Good respiratory excursion.  No audible wheezing CV:  Pulses intact.  Regular rhythm.  No major extremity edema MS: Normal AROM mjr joints.  No obvious deformity  Abdomen: Soft.  Nondistended.  nontender. Blood staining on LLQ honeycomb.  No evidence of peritonitis.  No incarcerated hernias.  Ext:   No deformity.  No mjr edema.  No cyanosis Skin: No petechiae / purpurea.  No major sores.  Warm and dry    Results:   Cultures: Recent Results (from the past 720 hour(s))  SARS Coronavirus 2 (TAT 6-24 hrs)     Status: None   Collection Time: 09/21/21 12:00 AM  Result Value Ref Range Status   SARS Coronavirus 2 RESULT: NEGATIVE  Corrected    Comment: RESULT: NEGATIVESARS-CoV-2 INTERPRETATION:A NEGATIVE  test result means that SARS-CoV-2 RNA was not present in the specimen above the limit of detection of this test. This does not preclude a possible SARS-CoV-2 infection and should not be used as the  sole basis for patient management decisions. Negative results must be combined with clinical observations, patient history, and epidemiological information. Optimum specimen types and timing for peak viral levels during infections caused by SARS-CoV-2  have not been determined. Collection of multiple specimens or types of specimens may be necessary to detect virus. Improper specimen collection and handling, sequence variability under primers/probes, or organism present below the limit of detection may  lead to false negative results. Positive and negative predictive values of testing are highly dependent on prevalence. False negative test results are more likely when prevalence of disease is high.The expected result is NEGATIVE.Fact S heet for  Healthcare Providers: CollegeCustoms.gl Sheet for Patients: https://poole-freeman.org/ Reference Range - Negative     Labs: Results  for orders placed or performed during the hospital encounter of 09/23/21 (from the past 48 hour(s))  Basic metabolic panel     Status: Abnormal   Collection Time: 09/24/21  5:06 AM  Result Value Ref Range   Sodium 133 (L) 135 - 145 mmol/L   Potassium 4.0 3.5 - 5.1 mmol/L   Chloride 103 98 - 111 mmol/L   CO2 22 22 - 32 mmol/L   Glucose, Bld 117 (H) 70 - 99 mg/dL    Comment: Glucose reference range applies only to samples taken after fasting for at least 8 hours.   BUN 10 8 - 23 mg/dL   Creatinine, Ser 0.98 (H) 0.61 - 1.24 mg/dL   Calcium 8.2 (L) 8.9 - 10.3 mg/dL   GFR, Estimated 59 (L) >60 mL/min    Comment: (NOTE) Calculated using the CKD-EPI Creatinine Equation (2021)    Anion gap 8 5 - 15    Comment: Performed at [facility], [address], [place], [place] [address]  CBC     Status: Abnormal   Collection Time: 09/24/21  5:06 AM  Result Value Ref Range   WBC 13.7 (H) 4.0 - 10.5 K/uL   RBC 3.68 (L) 4.22 - 5.81 MIL/uL   Hemoglobin 10.2 (L) 13.0 - 17.0 g/dL   HCT 78.2 (L) 95.6 - 21.3 %  MCV 87.5 80.0 - 100.0 fL   MCH 27.7 26.0 - 34.0 pg   MCHC 31.7 30.0 - 36.0 g/dL   RDW 15.8 (H) 11.5 - 15.5 %   Platelets 164 150 - 400 K/uL   nRBC 0.0 0.0 - 0.2 %    Comment: Performed at [facility], [place] [address] [place], [place] [address]  Magnesium     Status: None   Collection Time: 09/24/21  5:06 AM  Result Value Ref Range   Magnesium 1.8 1.7 - 2.4 mg/dL    Comment: Performed at [facility], [place] [address], [place], [place] [address]    Imaging / Studies: No results found.  Medications / Allergies: per chart  Antibiotics: Anti-infectives (From admission, onward)    Start     Dose/Rate Route Frequency Ordered Stop   09/24/21 0100  cefoTEtan (CEFOTAN) 2 g in sodium chloride 0.9 % 100 mL IVPB        2 g 200 mL/hr over 30 Minutes Intravenous Every 12 hours 09/23/21 1728 09/24/21 0149   09/23/21 1400  neomycin  (MYCIFRADIN) tablet 1,000 mg  Status:  Discontinued       See Hyperspace for full Linked Orders Report.   1,000 mg Oral 3 times per day 09/23/21 1015 09/23/21 1017   09/23/21 1400  metroNIDAZOLE (FLAGYL) tablet 1,000 mg  Status:  Discontinued       See Hyperspace for full Linked Orders Report.   1,000 mg Oral 3 times per day 09/23/21 1015 09/23/21 1017   09/23/21 1030  cefoTEtan (CEFOTAN) 2 g in sodium chloride 0.9 % 100 mL IVPB        2 g 200 mL/hr over 30 Minutes Intravenous On call to O.R. 09/23/21 1015 09/23/21 1322         Note: Portions of this report may have been transcribed using voice recognition software. Every effort was made to ensure accuracy; however, inadvertent computerized transcription errors may be present.   Any transcriptional errors that result from this process are unintentional.    [facility], [place] [address] [place], [place] [address] [phone] Fax [phone] Main  CONTACT INFORMATION:  Weekday (9AM-5PM): Call [facility] main office at [phone]  Weeknight (5PM-9AM) or Weekend/Holiday: Check [email or web address] (password " [email or web address]") for General Surgery [facility] coverage  (Please, do not use SecureChat as it is not reliable communication to operating surgeons for immediate patient care)      09/25/2021  8:47 AM

## 2021-09-25 NOTE — Evaluation (Signed)
Occupational Therapy Evaluation Patient Details Name: Brandon [name] MRN: [id number] DOB: October 15, 1946 Today's Date: 09/25/2021   History of Present Illness 75 yo male adm  for COLOSTOMY FOR COLON RESECTION, OSTOMY lysis of adhesions. PMH: schizophrenia, HTN, CKD, R ankle surgery   Clinical Impression   Patient is a pleasant 75 year old male who was admitted for above. Patient was living at home with sister prior level. Currently, patient is mod A for transfers with RW with min A for LB Dressing tasks while seated. Patient was noted to have decreased activity tolerance, poor safety awareness.  decreased endurance,and decreased standing balance with increased need for BUE support impacting participation in ADLs. Patient would continue to benefit from skilled OT services at this time while admitted and after d/c to address noted deficits in order to improve overall safety and independence in ADLs.       Recommendations for follow up therapy are one component of a multi-disciplinary discharge planning process, led by the attending physician.  Recommendations may be updated based on patient status, additional functional criteria and insurance authorization.   Follow Up Recommendations  Home health OT    Assistance Recommended at Discharge Frequent or constant Supervision/Assistance  Functional Status Assessment  Patient has had a recent decline in their functional status and demonstrates the ability to make significant improvements in function in a reasonable and predictable amount of time.  Equipment Recommendations  None recommended by OT    Recommendations for Other Services       Precautions / Restrictions Precautions Precautions: Fall Restrictions Weight Bearing Restrictions: No      Mobility Bed Mobility Overal bed mobility: Needs Assistance Bed Mobility: Supine to Sit     Supine to sit: Min assist     General bed mobility comments: with education for log rolling to avoid  pressure on belly    Transfers Overall transfer level: Needs assistance Equipment used: Rolling walker (2 wheels) Transfers: Sit to/from Stand Sit to Stand: Mod assist           General transfer comment: patient was mod A for first attempt. then [facility] was raised significantly and patient was able to sit to stand with min A      Balance Overall balance assessment: Needs assistance Sitting-balance support: Feet supported;No upper extremity supported Sitting balance-Leahy Scale: Fair     Standing balance support: Reliant on assistive device for balance;During functional activity Standing balance-Leahy Scale: Poor                             ADL either performed or assessed with clinical judgement   ADL Overall ADL's : Needs assistance/impaired Eating/Feeding: Set up;Sitting   Grooming: Oral care;Wash/dry face;Sitting;Set up Grooming Details (indicate cue type and reason): on EOB. attempted standing but noted to have bilateral knee contractures impacting ability to maintain standing balance without UE support. Upper Body Bathing: Set up;Sitting Upper Body Bathing Details (indicate cue type and reason): EOB Lower Body Bathing: Min guard;Sitting/lateral leans Lower Body Bathing Details (indicate cue type and reason): patient completed sitting on edge of bed with cues to ensure all areas are reached. Upper Body Dressing : Min guard;Sitting   Lower Body Dressing: Minimal assistance;Sitting/lateral leans     Toilet Transfer Details (indicate cue type and reason): deferred attempt with significant bilateral knee contractures. patient was able to participate in scooting up to head of bed for better positioning. Toileting- Clothing Manipulation and Hygiene: Sitting/lateral lean;Minimal  assistance               Vision Patient Visual Report: No change from baseline       Perception     Praxis      Pertinent Vitals/Pain Pain Assessment: Faces Faces Pain Scale:  Hurts a little bit Pain Location: stomach Pain Descriptors / Indicators: Discomfort;Grimacing Pain Intervention(s): Limited activity within patient's tolerance;Monitored during session;Repositioned     Hand Dominance Right   Extremity/Trunk Assessment Upper Extremity Assessment Upper Extremity Assessment: Overall WFL for tasks assessed   Lower Extremity Assessment Lower Extremity Assessment: Defer to PT evaluation (patient noted to have bilateral knee flexion contractures)   Cervical / Trunk Assessment Cervical / Trunk Assessment: Kyphotic   Communication Communication Communication: No difficulties   Cognition Arousal/Alertness: Awake/alert Behavior During Therapy: WFL for tasks assessed/performed Overall Cognitive Status: Within Functional Limits for tasks assessed                                       General Comments       Exercises General Exercises - Lower Extremity Ankle Circles/Pumps: AROM;Both;10 reps   Shoulder Instructions      Home Living Family/patient expects to be discharged to:: Private residence Living Arrangements: Other relatives Available Help at Discharge: Family;Available 24 hours/day Type of Home: House Home Access: Stairs to enter [employer] of Steps: 2 Entrance Stairs-Rails: None Home Layout: One level     Bathroom Shower/Tub: Tub/shower unit         Home Equipment: Agricultural consultant (2 wheels);Wheelchair - manual   Additional Comments: lives with sister.      Prior Functioning/Environment Prior Level of Function : Independent/Modified Independent             Mobility Comments: mod I with RW. denies falls in over a year ADLs Comments: sister does laundry, meals        OT Problem List: Decreased activity tolerance;Impaired balance (sitting and/or standing);Decreased safety awareness;Decreased knowledge of precautions;Decreased knowledge of use of DME or AE      OT Treatment/Interventions:  Self-care/ADL training;Therapeutic exercise;Neuromuscular education;Energy conservation;DME and/or AE instruction;Therapeutic activities;Patient/family education;Balance training    OT Goals(Current goals can be found in the care plan section) Acute Rehab OT Goals Patient Stated Goal: to watch movie on tv OT Goal Formulation: With patient Time For Goal Achievement: 10/09/21 Potential to Achieve Goals: Good  OT Frequency: Min 2X/week   Barriers to D/C:            Co-evaluation              AM-PAC OT "6 Clicks" Daily Activity     Outcome Measure Help from another person eating meals?: None Help from another person taking care of personal grooming?: None Help from another person toileting, which includes using toliet, bedpan, or urinal?: A Lot Help from another person bathing (including washing, rinsing, drying)?: A Lot Help from another person to put on and taking off regular upper body clothing?: A Little Help from another person to put on and taking off regular lower body clothing?: A Lot 6 Click Score: 17   End of Session Equipment Utilized During Treatment: Rolling walker (2 wheels) Nurse Communication: Mobility status  Activity Tolerance: Patient tolerated treatment well Patient left: in bed;with call bell/phone within reach;with bed alarm set  OT Visit Diagnosis: Unsteadiness on feet (R26.81);Muscle weakness (generalized) (M62.81)  Time: 3976-7341 OT Time Calculation (min): 19 min Charges:  OT General Charges $OT Visit: 1 Visit OT Evaluation $OT Eval Low Complexity: 1 Low  Viann Shove, MS Acute Rehabilitation Department Office# [phone] Pager# [phone]   Ardyth Harps 09/25/2021, 3:44 PM

## 2021-09-25 NOTE — Evaluation (Signed)
Physical Therapy Evaluation Patient Details Name: Brandon [name] MRN: [id number] DOB: [birth year] Today's Date: 09/25/2021  History of Present Illness  75 yo male adm  for COLOSTOMY FOR COLON RESECTION, OSTOMY lysis of adhesions. PMH: schizophrenia, HTN, CKD, R ankle surgery  Clinical Impression  Pt admitted with above diagnosis.  Pt is mod I household ambulator at baseline. Fatigued after amb ~ 63' with RW with LOB x1. Anticipate steady progress in acute setting. May benefit from HHPT at d/c   Pt currently with functional limitations due to the deficits listed below (see PT Problem List). Pt will benefit from skilled PT to increase their independence and safety with mobility to allow discharge to the venue listed below.          Recommendations for follow up therapy are one component of a multi-disciplinary discharge planning process, led by the attending physician.  Recommendations may be updated based on patient status, additional functional criteria and insurance authorization.  Follow Up Recommendations Home health PT    Assistance Recommended at Discharge Intermittent Supervision/Assistance  Functional Status Assessment Patient has had a recent decline in their functional status and demonstrates the ability to make significant improvements in function in a reasonable and predictable amount of time.  Equipment Recommendations  None recommended by PT    Recommendations for Other Services       Precautions / Restrictions Precautions Precautions: Fall Restrictions Weight Bearing Restrictions: No      Mobility  Bed Mobility               General bed mobility comments: NT, in recliner    Transfers Overall transfer level: Needs assistance Equipment used: Rolling walker (2 wheels) Transfers: (P) Sit to/from Stand Sit to Stand: Min guard           General transfer comment: cues for hand placement    Ambulation/Gait Ambulation/Gait assistance: Min guard;Min  assist Gait Distance (Feet): 80 Feet Assistive device: Rolling walker (2 wheels) Gait Pattern/deviations: Step-through pattern;Decreased stride length;Knee flexed in stance - left;Knee flexed in stance - right       General Gait Details: bil knees flexed L greater than R, bil hips adducted, ankles plantarflexed L >R. min/guard to min assist for safety and stability with turns. pt with LOB x1 needing min assist to recover when L hand lifted from walker to adjust mask. otherwise  overall steady despite extensive gait deviations  Stairs            Wheelchair Mobility    Modified Rankin (Stroke Patients Only)       Balance Overall balance assessment: Needs assistance Sitting-balance support: Feet supported;No upper extremity supported Sitting balance-Leahy Scale: Fair     Standing balance support: Reliant on assistive device for balance;During functional activity Standing balance-Leahy Scale: Poor                               Pertinent Vitals/Pain Pain Assessment: Faces Faces Pain Scale: Hurts a little bit Pain Location: knees Pain Descriptors / Indicators: Discomfort;Grimacing Pain Intervention(s): Limited activity within patient's tolerance;Monitored during session;Repositioned    Home Living Family/patient expects to be discharged to:: Private residence Living Arrangements: Other relatives Available Help at Discharge: Family;Available 24 hours/day Type of Home: House Home Access: Stairs to enter Entrance Stairs-Rails: None Entrance Stairs-Number of Steps: 2   Home Layout: One level Home Equipment: Agricultural consultant (2 wheels);Wheelchair - manual Additional Comments: lives with sister.  Prior Function Prior Level of Function : Independent/Modified Independent             Mobility Comments: mod I with RW. denies falls in over a year ADLs Comments: sister does laundry, meals     Hand Dominance        Extremity/Trunk Assessment   Upper  Extremity Assessment Upper Extremity Assessment: Overall WFL for tasks assessed    Lower Extremity Assessment Lower Extremity Assessment: RLE deficits/detail;LLE deficits/detail RLE Deficits / Details: knee flexion contracture ~ 25+ degrees; limited hip abduction d/t adductor tightness; strength grossly 3/5 LLE Deficits / Details: knee flexion contracture ~35+ degrees; limited hip abduction d/t adductor tightness. strnegth grossly 3/5       Communication   Communication: No difficulties  Cognition Arousal/Alertness: Awake/alert Behavior During Therapy: WFL for tasks assessed/performed Overall Cognitive Status: Within Functional Limits for tasks assessed                                          General Comments      Exercises General Exercises - Lower Extremity Ankle Circles/Pumps: AROM;Both;10 reps   Assessment/Plan    PT Assessment Patient needs continued PT services  PT Problem List Decreased strength;Decreased mobility;Decreased range of motion;Decreased activity tolerance;Decreased balance;Decreased knowledge of use of DME;Pain       PT Treatment Interventions DME instruction;Therapeutic activities;Gait training;Functional mobility training;Therapeutic exercise;Patient/family education;Balance training    PT Goals (Current goals can be found in the Care Plan section)  Acute Rehab PT Goals Patient Stated Goal: home PT Goal Formulation: With patient Time For Goal Achievement: 10/09/21 Potential to Achieve Goals: Good    Frequency Min 3X/week   Barriers to discharge        Co-evaluation               AM-PAC PT "6 Clicks" Mobility  Outcome Measure Help needed turning from your back to your side while in a flat bed without using bedrails?: None Help needed moving from lying on your back to sitting on the side of a flat bed without using bedrails?: None Help needed moving to and from a bed to a chair (including a wheelchair)?: A Little Help  needed standing up from a chair using your arms (e.g., wheelchair or bedside chair)?: A Little Help needed to walk in hospital room?: A Little Help needed climbing 3-5 steps with a railing? : A Lot 6 Click Score: 19    End of Session Equipment Utilized During Treatment: Gait belt Activity Tolerance: Patient tolerated treatment well Patient left: with call bell/phone within reach;in chair;with chair alarm set Nurse Communication: Mobility status PT Visit Diagnosis: Other abnormalities of gait and mobility (R26.89);Difficulty in walking, not elsewhere classified (R26.2)    Time: 8295-6213 PT Time Calculation (min) (ACUTE ONLY): 16 min   Charges:   PT Evaluation $PT Eval Low Complexity: 1 Low          Lai Hendriks, PT  Acute Rehab Dept ([facility]) [phone] Pager [phone]  09/25/2021   [facility] 09/25/2021, 11:51 AM

## 2021-09-26 LAB — BASIC METABOLIC PANEL
Anion gap: 4 — ABNORMAL LOW (ref 5–15)
BUN: 14 mg/dL (ref 8–23)
CO2: 28 mmol/L (ref 22–32)
Calcium: 8.4 mg/dL — ABNORMAL LOW (ref 8.9–10.3)
Chloride: 104 mmol/L (ref 98–111)
Creatinine, Ser: 1 mg/dL (ref 0.61–1.24)
GFR, Estimated: >60 mL/min (ref >60)
Glucose, Bld: 91 mg/dL (ref 70–99)
Potassium: 3.8 mmol/L (ref 3.5–5.1)
Sodium: 136 mmol/L (ref 135–145)

## 2021-09-26 LAB — CBC
HCT: 26.2 % — ABNORMAL LOW (ref 39.0–52.0)
Hemoglobin: 8.5 g/dL — ABNORMAL LOW (ref 13.0–17.0)
MCH: 27.7 pg (ref 26.0–34.0)
MCHC: 32.4 g/dL (ref 30.0–36.0)
MCV: 85.3 fL (ref 80.0–100.0)
Platelets: 149 10*3/uL — ABNORMAL LOW (ref 150–400)
RBC: 3.07 MIL/uL — ABNORMAL LOW (ref 4.22–5.81)
RDW: 15.7 % — ABNORMAL HIGH (ref 11.5–15.5)
WBC: 9.6 10*3/uL (ref 4.0–10.5)
nRBC: 0 % (ref 0.0–0.2)

## 2021-09-26 LAB — MAGNESIUM: Magnesium: 1.9 mg/dL (ref 1.7–2.4)

## 2021-09-26 NOTE — Progress Notes (Signed)
Brandon [name] [id number] [birth year]  CARE TEAM:  PCP: [name], DO  Outpatient Care Team: Patient Care Team: [name], DO as PCP - General (General Practice) [name], MD as PCP - Cardiology (Cardiology) Milus Banister, MD as Attending Physician (Gastroenterology) Michael Boston, MD as Consulting Physician (Colon and Rectal Surgery) Jovita Kussmaul, MD as Consulting Physician (General Surgery) [name], MD as Consulting Physician (Cardiology) Claudia Desanctis, MD as Consulting Physician (Nephrology)  Inpatient Treatment Team: Treatment Team: Attending Provider: Michael Boston, MD; Registered Nurse: Dorinda Hill, RN; Utilization Review: Claudie Leach, RN   Problem List:   Principal Problem:   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  No definite obstructions.  Very long but not inflamed appendix.  Rectal stump at sacral promontory.  No trimming done.  It is a 31 EEA descending to proximal rectum anastomosis 12 cm from anal verge      Assessment  Progressing well  [facility] Stay = 3 days)  Plan:  -ERAS. -ABLA anemia- recheck hgb tomorrow. No active bleeding clinically. -Continue observation/supportive care, await bowel movement -VTE prophylaxis- SCDs, etc -mobilize as tolerated to help recovery.  He struggles with limited mobility with strictures and chronic  venous insufficiency.  Ask physical and Occupational Therapy for evaluation as suspect he would benefit from some home health at least.  Hopefully can avoid SNF  Disposition:  Disposition:  The patient is from: Home  Anticipate discharge to:  Home with Home Health  Anticipated Date of Discharge is:  December 11,2022    Barriers to discharge:  Pending Clinical improvement (more likely than not)  Patient currently is NOT MEDICALLY STABLE for discharge from the hospital from a surgery standpoint.      25 minutes spent in review, evaluation, examination, counseling, and coordination of care.   I have reviewed this patient's available data, including medical history, events of note, physical examination and test results as part of my evaluation.  A significant portion of that time was spent in counseling.  Care during the described time interval was provided by me.  09/26/2021    Subjective: (Chief complaint)  Feeling well.  Tolerating a diet without difficulty, mobilizing, but has not had a bowel movement yet  Objective:  Vital signs:  Vitals:   09/25/21 0602 09/25/21 1358 09/25/21 2133 09/26/21 0610  BP: 126/66 109/77 108/61 103/60  Pulse: (!) 109 (!) 109 98 92  Resp: 18 14 18 18   Temp: (!) 97.4 F (36.3 C) (!) 97.5 F (36.4 C) 97.9 F (36.6 C) 98 F (36.7 C)  TempSrc: Oral Oral Oral Oral  SpO2: 99% 100% 100% 99%  Weight:      Height:        Last BM Date: 09/25/21  Intake/Output   Yesterday:  12/10 0701 - 12/11 0700 In: 2520 [P.O.:2520] Out: 2100 [Urine:2100] This shift:  No intake/output data recorded.  Bowel function:  Flatus: YES  BM:  No  Drain: (No drain)   Physical Exam:  General: Pt awake/alert in no acute distress Eyes: PERRL, normal EOM.  Sclera clear.  No icterus Neuro: CN II-XII intact w/o focal sensory/motor deficits. Lymph: No head/neck/groin lymphadenopathy Psych:  No delerium/psychosis/paranoia.  Oriented x 4 HENT: Normocephalic,  Mucus membranes moist.  No thrush Neck: Supple, No tracheal deviation.  No obvious thyromegaly Chest: No pain to chest wall compression.  Good respiratory excursion.  No audible wheezing CV:  Pulses intact.  Regular rhythm.  No major extremity edema MS: Normal AROM mjr joints.  No obvious deformity  Abdomen: Soft.  Nondistended.  nontender.  All dressings and wicks removed, some oozing from colostomy site on both sides of the incision, new gauze pressure dressing applied.  No evidence of peritonitis.  No incarcerated hernias.  Ext:   No deformity.  No mjr edema.  No cyanosis Skin: No petechiae / purpurea.  No major sores.  Warm and dry    Results:   Cultures: Recent Results (from the past 720 hour(s))  SARS Coronavirus 2 (TAT 6-24 hrs)     Status: None   Collection Time: 09/21/21 12:00 AM  Result Value Ref Range Status   SARS Coronavirus 2 RESULT: NEGATIVE  Corrected    Comment: RESULT: NEGATIVESARS-CoV-2 INTERPRETATION:A NEGATIVE  test result means that SARS-CoV-2 RNA was not present in the specimen above the limit of detection of this test. This does not preclude a possible SARS-CoV-2 infection and should not be used as the  sole basis for patient management decisions. Negative results must be combined with clinical observations, patient history, and epidemiological information. Optimum specimen types and timing for peak viral levels during infections caused by SARS-CoV-2  have not been determined. Collection of multiple specimens or types of specimens may be necessary to detect virus. Improper specimen collection and handling, sequence variability under primers/probes, or organism present below the limit of detection may  lead to false negative results. Positive and negative predictive values of testing are highly dependent on prevalence. False negative test results are more likely when prevalence of disease is high.The expected result is NEGATIVE.Fact S heet for  Healthcare Providers:  LocalChronicle.no Sheet for Patients: SalonLookup.es Reference Range - Negative     Labs: Results for orders placed or performed during the hospital encounter of 09/23/21 (from the past 48 hour(s))  CBC     Status: Abnormal   Collection Time: 09/26/21  4:54 AM  Result Value Ref Range   WBC 9.6 4.0 - 10.5 K/uL   RBC 3.07 (L) 4.22 - 5.81 MIL/uL   Hemoglobin 8.5 (L) 13.0 - 17.0 g/dL   HCT 26.2 (L) 39.0 - 52.0 %   MCV 85.3 80.0 - 100.0 fL   MCH 27.7 26.0 - 34.0 pg   MCHC 32.4 30.0 - 36.0 g/dL   RDW 15.7 (H) 11.5 - 15.5 %   Platelets 149 (L) 150 - 400 K/uL   nRBC 0.0 0.0 - 0.2 %    Comment: Performed at [facility], [place] [address], [place], [place] [id number]  Basic metabolic panel     Status: Abnormal   Collection Time: 09/26/21  4:54 AM  Result Value Ref Range   Sodium 136 135 - 145 mmol/L   Potassium 3.8 3.5 - 5.1 mmol/L   Chloride 104 98 - 111 mmol/L   CO2 28 22 - 32 mmol/L   Glucose, Bld 91 70 - 99 mg/dL    Comment: Glucose reference  range applies only to samples taken after fasting for at least 8 hours.   BUN 14 8 - 23 mg/dL   Creatinine, Ser 7.32 0.61 - 1.24 mg/dL   Calcium 8.4 (L) 8.9 - 10.3 mg/dL   GFR, Estimated >20 >25 mL/min    Comment: (NOTE) Calculated using the CKD-EPI Creatinine Equation (2021)    Anion gap 4 (L) 5 - 15    Comment: Performed at [facility], [address], [place], [place] [address]  Magnesium     Status: None   Collection Time: 09/26/21  4:54 AM  Result Value Ref Range   Magnesium 1.9 1.7 - 2.4 mg/dL    Comment: Performed at [facility], [address] [place], [place] [address]    Imaging / Studies: No results found.  Medications / Allergies: per chart  Antibiotics: Anti-infectives (From admission, onward)    Start     Dose/Rate Route Frequency Ordered Stop   09/24/21 0100  cefoTEtan (CEFOTAN) 2 g in sodium  chloride 0.9 % 100 mL IVPB        2 g 200 mL/hr over 30 Minutes Intravenous Every 12 hours 09/23/21 1728 09/24/21 0149   09/23/21 1400  neomycin (MYCIFRADIN) tablet 1,000 mg  Status:  Discontinued       See Hyperspace for full Linked Orders Report.   1,000 mg Oral 3 times per day 09/23/21 1015 09/23/21 1017   09/23/21 1400  metroNIDAZOLE (FLAGYL) tablet 1,000 mg  Status:  Discontinued       See Hyperspace for full Linked Orders Report.   1,000 mg Oral 3 times per day 09/23/21 1015 09/23/21 1017   09/23/21 1030  cefoTEtan (CEFOTAN) 2 g in sodium chloride 0.9 % 100 mL IVPB        2 g 200 mL/hr over 30 Minutes Intravenous On call to O.R. 09/23/21 1015 09/23/21 1322         Note: Portions of this report may have been transcribed using voice recognition software. Every effort was made to ensure accuracy; however, inadvertent computerized transcription errors may be present.   Any transcriptional errors that result from this process are unintentional.    Berna Bue MD [facility], [facility]  [facility]  [address], [address] [place], [place] [address] [phone] Fax [phone] Main  CONTACT INFORMATION:  Weekday (9AM-5PM): Call [facility] main office at [phone]  Weeknight (5PM-9AM) or Weekend/Holiday: Check [email or web address] (password " [email or web address]") for General Surgery [facility] coverage  (Please, do not use SecureChat as it is not reliable communication to operating surgeons for immediate patient care)      09/26/2021  9:16 AM

## 2021-09-27 LAB — CBC
HCT: 27.9 % — ABNORMAL LOW (ref 39.0–52.0)
Hemoglobin: 9.1 g/dL — ABNORMAL LOW (ref 13.0–17.0)
MCH: 27.7 pg (ref 26.0–34.0)
MCHC: 32.6 g/dL (ref 30.0–36.0)
MCV: 84.8 fL (ref 80.0–100.0)
Platelets: 155 10*3/uL (ref 150–400)
RBC: 3.29 MIL/uL — ABNORMAL LOW (ref 4.22–5.81)
RDW: 15.5 % (ref 11.5–15.5)
WBC: 10.5 10*3/uL (ref 4.0–10.5)
nRBC: 0 % (ref 0.0–0.2)

## 2021-09-27 LAB — SURGICAL PATHOLOGY

## 2021-09-27 NOTE — TOC Transition Note (Signed)
Transition of Care [facility]) - CM/SW Discharge Note  Patient Details  Name: GERADO NABERS MRN: [id number] Date of Birth: 1946-08-14  Transition of Care [facility]) CM/SW Contact:  Ewing Schlein, LCSW Phone Number: 09/27/2021, 12:34 PM  Clinical Narrative: HH orders placed for PT and OT. CSW made [facility] referral to [facility] with [facility] that was accepted. CSW updated RN and patient. TOC signing off.  Final next level of care: Home w Home Health Services Barriers to Discharge: Barriers Resolved  Patient Goals and CMS Choice Patient states their goals for this hospitalization and ongoing recovery are:: Return home Choice offered to / list presented to : NA  Discharge Plan and Services In-house Referral: Clinical Social Work Post Acute Care Choice: NA          DME Arranged: N/A DME Agency: NA HH Arranged: PT, OT HH Agency: [facility] Date HH Agency Contacted: 09/27/21 Time HH Agency Contacted: 1052 Representative spoke with at [facility] Agency: Stacie  Readmission Risk Interventions Readmission Risk Prevention Plan 09/24/2021  Transportation Screening Complete  Home Care Screening Complete  Medication Review (RN CM) Complete  Some recent data might be hidden

## 2021-09-27 NOTE — Progress Notes (Signed)
PT Cancellation Note  Patient Details Name: Brandon [name] MRN: [id number] DOB: 02/20/1946   Cancelled Treatment:    Reason Eval/Treat Not Completed: Patient declined. Pt scheduled to d/c and waiting for sister to pick him up. He pleasantly declined PT.  Enzo Montgomery 09/27/2021, 1:58 PM

## 2021-09-27 NOTE — Discharge Summary (Signed)
Physician Discharge Summary    Patient ID: Brandon [name] MRN: [id number] DOB/AGE: May 28, 1946  75 y.o.  Patient Care Team: [name], DO as PCP - General ([place]) [name], MD as PCP - Cardiology (Cardiology) Milus Banister, MD as Attending Physician (Gastroenterology) Michael Boston, MD as Consulting Physician (Colon and Rectal Surgery) Jovita Kussmaul, MD as Consulting Physician (General Surgery) [name], MD as Consulting Physician (Cardiology) Claudia Desanctis, MD as Consulting Physician (Nephrology)  Admit date: 09/23/2021  Discharge date: 09/27/2021  Hospital Stay = 4 days    Discharge Diagnoses:  Principal Problem:   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([place]) Active Problems:   Schizophrenia ([place])   Chronic venous insufficiency   Laryngopharyngeal reflux (LPR)   Stage 3a chronic kidney disease ([place])   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  No definite obstructions.  Very long but not inflamed appendix.  Rectal stump at sacral promontory.  No trimming done.  It is a 31 EEA descending to proximal rectum anastomosis 12 cm from anal verge    Consults: Physical and Occupational Therapy  Hospital Course:   The patient underwent the surgery above.  Postoperatively, the patient gradually mobilized and advanced to a solid diet.  Pain and other symptoms were treated aggressively.  Physical Occupational Therapy were consulted they felt he would benefit with home health therapies.  I agreed.  That is being arranged.  By the time of discharge, the  patient was walking well the hallways, eating food, having flatus.  Pain was well-controlled on an oral medications.  Based on meeting discharge criteria and continuing to recover, I felt it was safe for the patient to be discharged from the hospital to further recover with close followup. Postoperative recommendations were discussed in detail.  They are written as well.  Discharged Condition: good  Discharge Exam: Blood pressure (!) 134/91, pulse 97, temperature 97.9 F (36.6 C), temperature source Oral, resp. rate 14, height 6' (1.829 m), weight 87 kg, SpO2 95 %.  General: Pt awake/alert in no acute distress.  Bright and alert.  Sitting up smiling.  Moving easily. Eyes: PERRL, normal EOM.  Sclera clear.  No icterus Neuro: CN II-XII intact w/o focal sensory/motor deficits. Lymph: No head/neck/groin lymphadenopathy Psych:  No delerium/psychosis/paranoia.  Oriented x 4 HENT: Normocephalic, Mucus membranes moist.  No thrush Neck: Supple, No tracheal deviation.  No obvious thyromegaly Chest: No pain to chest wall compression.  Good respiratory excursion.  No audible wheezing CV:  Pulses intact.  Regular rhythm.  No major extremity edema MS: Normal AROM mjr joints.  No obvious deformity   Abdomen: Soft.  Nondistended.  Mildly tender at incisions only.  No evidence of peritonitis.  No incarcerated hernias.   Ext:   Some mild contractures in lower extremities stable.  No mjr edema.  No cyanosis Skin: No petechiae / purpurea.  No major sores.  Warm and dry     Disposition:    Follow-up Information     Michael Boston, MD Follow up in 3 week(s).   Specialties: General Surgery, Colon and Rectal  Surgery Why: To follow up after your operation, To follow up after your hospital stay Contact information: [place] [address] [phone]                 Discharge disposition: 01-Home or Self Care       Discharge Instructions     Call MD for:    Complete by: As directed    FEVER > 101.5 F (Temperatures <101.70F can occasionally happen and are not significant)   Call MD for:  extreme fatigue   Complete by: As directed    Call MD for:  persistant dizziness or light-headedness   Complete by: As directed    Call MD for:  persistant nausea and vomiting   Complete by: As directed    Call MD for:  redness, tenderness, or signs of infection (pain, swelling, redness, odor or green/yellow discharge around incision site)   Complete by: As directed    Call MD for:  severe uncontrolled pain   Complete by: As directed    Diet - low sodium heart healthy   Complete by: As directed    Follow a light diet the first few days at home.    If you feel full, bloated, or constipated, stay on a liquid diet until you feel better and not constipated. Gradually get back to a solid diet.  Avoid fast food or heavy meals the first week as you are more likely to get nauseated. It is expected for your digestive tract to need a few months to get back to normal.   Discharge wound care:   Complete by: As directed    You have an open wound. If you have an open wound with a wound vac, see wound vac care instructions. If the wound is being packed, see wound care instructions.    In general, it is encouraged that you remove your dressing and ribbon shoelace packing, shower with soap & water, and replace a dry dressing once a day.    Eventually your body will heal & pull the open wound closed over the next few months.  Raw open wounds will occasionally bleed or secrete yellow drainage until it heals closed.   Pressure on the dressing for 30 minutes will stop most wound bleeding Drain sites will drain a little until the drain is removed.   Driving Restrictions   Complete by: As directed    You may drive when you are no longer taking narcotic prescription pain medication, you can comfortably wear a seatbelt, and you can safely make sudden turns/stops to protect  yourself without hesitating due to pain.   Increase activity slowly   Complete by: As directed    Lifting restrictions   Complete by: As directed    Start light daily activities --- self-care, walking, climbing stairs- beginning the day after surgery.   Gradually increase activities as tolerated.   Control your pain to be active.   Stop when you are tired.   Ideally, walk several times a day, eventually an hour a day.   Most people are back to most day-to-day activities in a few weeks.  It takes 4-8 weeks to get back to unrestricted, intense activity. If you can walk 30 minutes without difficulty, it is safe to try more intense activity such as jogging, treadmill, bicycling, low-impact aerobics, swimming, etc. Save the most intensive and strenuous activity for last (Usually 4-8 weeks after surgery) such as sit-ups, heavy lifting, contact sports, etc.  Refrain from any intense heavy lifting or straining until you are off narcotics for pain control.  You will have off days, but things should improve week-by-week. DO NOT PUSH THROUGH PAIN.   Let pain be your guide: If it hurts to do something, don't do it.  Pain is your body warning you to avoid that activity for another week until the pain goes down.   May shower / Bathe   Complete by: As directed    May walk up steps   Complete by: As directed    Sexual Activity Restrictions   Complete by: As directed    You may have sexual intercourse when it is comfortable. If it hurts to do something, stop.       Allergies as of 09/27/2021   No Known Allergies      Medication List     TAKE these medications    acetaminophen 650 MG CR tablet Commonly known as: TYLENOL Take 650 mg by mouth every 8 (eight) hours as needed for pain.   aspirin EC 81 MG tablet Take 81 mg by mouth daily.   buPROPion 150 MG 12 hr tablet Commonly known as: WELLBUTRIN SR Take 150 mg by mouth daily.   Cod Liver Oil 1000 MG Caps Take 1,000 mg by mouth daily.    finasteride 5 MG tablet Commonly known as: PROSCAR Take 5 mg by mouth daily.   Fish Oil 1000 MG Caps Take 1,000 mg by mouth daily.   fluticasone 50 MCG/ACT nasal spray Commonly known as: FLONASE Place 1 spray into both nostrils daily as needed for allergies or rhinitis.   losartan 50 MG tablet Commonly known as: COZAAR Take 50 mg by mouth daily.   omeprazole 20 MG capsule Commonly known as: PRILOSEC Take 20 mg by mouth daily.   oxybutynin 5 MG 24 hr tablet Commonly known as: DITROPAN-XL Take 5 mg by mouth daily.   rosuvastatin 5 MG tablet Commonly known as: CRESTOR Take 5 mg by mouth at bedtime.   tamsulosin 0.4 MG Caps capsule Commonly known as: FLOMAX Take 0.4 mg by mouth daily.   traMADol 50 MG tablet Commonly known as: ULTRAM Take 1-2 tablets (50-100 mg total) by mouth every 6 (six) hours as needed for moderate pain or severe pain.   Travoprost (BAK Free) 0.004 % Soln ophthalmic solution Commonly known as: TRAVATAN Place 1 drop into both eyes at bedtime.   trifluoperazine 5 MG tablet Commonly known as: STELAZINE Take 5 mg by mouth at bedtime.   Vitamin D3 25 MCG (1000 UT) Caps Take 2,000 Units by mouth daily.               Discharge Care Instructions  (From admission, onward)           Start     Ordered   09/23/21 0000  Discharge wound care:       Comments: You have an open wound. If you have an open wound with a wound vac, see wound vac care instructions. If the wound is being packed, see wound care instructions.    In general, it is encouraged that you remove your dressing and ribbon shoelace packing, shower with soap & water, and replace a dry dressing once a day.    Eventually your body will heal & pull the open wound closed over the next few months.  Raw open wounds will occasionally bleed or secrete yellow drainage until it heals closed.   Pressure on the dressing for 30 minutes  will stop most wound bleeding Drain sites will drain a  little until the drain is removed.   09/23/21 1111            Significant Diagnostic Studies:  Results for orders placed or performed during the hospital encounter of 09/23/21 (from the past 72 hour(s))  CBC     Status: Abnormal   Collection Time: 09/26/21  4:54 AM  Result Value Ref Range   WBC 9.6 4.0 - 10.5 K/uL   RBC 3.07 (L) 4.22 - 5.81 MIL/uL   Hemoglobin 8.5 (L) 13.0 - 17.0 g/dL   HCT 26.2 (L) 39.0 - 52.0 %   MCV 85.3 80.0 - 100.0 fL   MCH 27.7 26.0 - 34.0 pg   MCHC 32.4 30.0 - 36.0 g/dL   RDW 15.7 (H) 11.5 - 15.5 %   Platelets 149 (L) 150 - 400 K/uL   nRBC 0.0 0.0 - 0.2 %    Comment: Performed at [facility], [place] [address], [place], [place] [id number]  Basic metabolic panel     Status: Abnormal   Collection Time: 09/26/21  4:54 AM  Result Value Ref Range   Sodium 136 135 - 145 mmol/L   Potassium 3.8 3.5 - 5.1 mmol/L   Chloride 104 98 - 111 mmol/L   CO2 28 22 - 32 mmol/L   Glucose, Bld 91 70 - 99 mg/dL    Comment: Glucose reference range applies only to samples taken after fasting for at least 8 hours.   BUN 14 8 - 23 mg/dL   Creatinine, Ser 1.00 0.61 - 1.24 mg/dL   Calcium 8.4 (L) 8.9 - 10.3 mg/dL   GFR, Estimated >60 >60 mL/min    Comment: (NOTE) Calculated using the CKD-EPI Creatinine Equation (2021)    Anion gap 4 (L) 5 - 15    Comment: Performed at [facility], [place] [address], [place], [place] [address]  Magnesium     Status: None   Collection Time: 09/26/21  4:54 AM  Result Value Ref Range   Magnesium 1.9 1.7 - 2.4 mg/dL    Comment: Performed at [facility], [place] [address], [place], [place] [address]  CBC     Status: Abnormal   Collection Time: 09/27/21  4:27 AM  Result Value Ref Range   WBC 10.5 4.0 - 10.5 K/uL   RBC 3.29 (L) 4.22 - 5.81 MIL/uL   Hemoglobin 9.1 (L) 13.0 - 17.0 g/dL   HCT 27.9 (L) 39.0 - 52.0 %   MCV 84.8 80.0 - 100.0 fL   MCH 27.7 26.0 - 34.0 pg   MCHC 32.6 30.0 - 36.0  g/dL   RDW 15.5 11.5 - 15.5 %   Platelets 155 150 - 400 K/uL   nRBC 0.0 0.0 - 0.2 %    Comment: Performed at [facility], [place] [address], [place], [place] [address]    No results found.  Past Medical History:  Diagnosis Date   Allergy    Arthritis    Bilateral knee swelling    Glaucoma    Hypertension    Prostate disorder    Schizophrenia ([place])    Weakness of lower extremity    left knee, uses walker    Past Surgical History:  Procedure Laterality Date   [place]   had broken right ankle/had 3 surgeries in 1996/ has pins and rods   BOWEL DECOMPRESSION N/A 05/07/2021   Procedure: BOWEL DECOMPRESSION;  Surgeon:  Mansouraty, Telford Nab., MD;  Location: [facility];  Service: Gastroenterology;  Laterality: N/A;   BOWEL DECOMPRESSION N/A 05/06/2021   Procedure: BOWEL DECOMPRESSION;  Surgeon: Milus Banister, MD;  Location: [facility];  Service: Endoscopy;  Laterality: N/A;   COLONOSCOPY WITH PROPOFOL N/A 05/06/2021   Procedure: COLONOSCOPY WITH PROPOFOL;  Surgeon: Milus Banister, MD;  Location: [facility];  Service: Endoscopy;  Laterality: N/A;   FLEXIBLE SIGMOIDOSCOPY N/A 05/07/2021   Procedure: FLEXIBLE SIGMOIDOSCOPY;  Surgeon: Rush Landmark Telford Nab., MD;  Location: [facility];  Service: Gastroenterology;  Laterality: N/A;   LAPAROTOMY N/A 05/08/2021   Procedure: EXPLORATORY LAPAROTOMY, SIGMOID COLECTOMY, COLOSTOMY;  Surgeon: Jovita Kussmaul, MD;  Location: [facility];  Service: General;  Laterality: N/A;   LYSIS OF ADHESION N/A 09/23/2021   Procedure: ROBOTIC LYSIS OF ADHESIONS;  Surgeon: Michael Boston, MD;  Location: [facility];  Service: General;  Laterality: N/A;   PROCTOSCOPY N/A 09/23/2021   Procedure: RIGID PROCTOSCOPY;  Surgeon: Michael Boston, MD;  Location: [facility];  Service: General;  Laterality: N/A;   XI ROBOTIC ASSISTED COLOSTOMY TAKEDOWN N/A 09/23/2021   Procedure: XI ROBOTIC ASSISTED COLOSTOMY TAKEDOWN WITH BILATERAL TAP BLOCK;  Surgeon:  Michael Boston, MD;  Location: [facility];  Service: General;  Laterality: N/A;    Social History   Socioeconomic History   Marital status: Single    Spouse name: Not on file   Number of children: Not on file   Years of education: Not on file   Highest education level: Not on file  Occupational History   Not on file  Tobacco Use   Smoking status: Never   Smokeless tobacco: Never  Vaping Use   Vaping Use: Never used  Substance and Sexual Activity   Alcohol use: No   Drug use: No   Sexual activity: Not on file  Other Topics Concern   Not on file  Social History Narrative   Not on file   Social Determinants of Health   Financial Resource Strain: Not on file  Food Insecurity: Not on file  Transportation Needs: Not on file  Physical Activity: Not on file  Stress: Not on file  Social Connections: Not on file  Intimate Partner Violence: Not on file    Family History  Problem Relation Age of Onset   Kidney disease Mother    Breast cancer Mother    Breast cancer Father    Leukemia Father     Current Facility-Administered Medications  Medication Dose Route Frequency Provider Last Rate Last Admin   acetaminophen (TYLENOL) tablet 1,000 mg  1,000 mg Oral Lajuana Ripple, MD   1,000 mg at 09/27/21 0529   alum & mag hydroxide-simeth (MAALOX/MYLANTA) 200-200-20 MG/5ML suspension 30 mL  30 mL Oral Q6H PRN Michael Boston, MD       aspirin EC tablet 81 mg  81 mg Oral Daily Michael Boston, MD   81 mg at 09/26/21 1030   buPROPion (WELLBUTRIN SR) 12 hr tablet 150 mg  150 mg Oral Daily Michael Boston, MD   150 mg at 09/26/21 1030   diphenhydrAMINE (BENADRYL) 12.5 MG/5ML elixir 12.5 mg  12.5 mg Oral Q6H PRN Michael Boston, MD       Or   diphenhydrAMINE (BENADRYL) injection 12.5 mg  12.5 mg Intravenous Q6H PRN Michael Boston, MD       enalaprilat (VASOTEC) injection 0.625-1.25 mg  0.625-1.25 mg Intravenous Q6H PRN Michael Boston, MD       enoxaparin (LOVENOX) injection 40 mg  40 mg Subcutaneous  Q24H Michael Boston, MD   40 mg at 09/27/21 0737   feeding supplement (ENSURE SURGERY) liquid 237 mL  237 mL Oral BID BM Michael Boston, MD   237 mL at 09/26/21 1729   finasteride (PROSCAR) tablet 5 mg  5 mg Oral Daily Michael Boston, MD   5 mg at 09/26/21 1030   fluticasone (FLONASE) 50 MCG/ACT nasal spray 1 spray  1 spray Each Nare Daily PRN Michael Boston, MD       haloperidol lactate (HALDOL) injection 2-5 mg  2-5 mg Intravenous Q6H PRN Michael Boston, MD       HYDROmorphone (DILAUDID) injection 0.5-2 mg  0.5-2 mg Intravenous Q4H PRN Michael Boston, MD       latanoprost (XALATAN) 0.005 % ophthalmic solution 1 drop  1 drop Both Eyes Ardeen Fillers, MD   1 drop at 09/26/21 2154   lip balm (CARMEX) ointment 1 application  1 application Topical BID Michael Boston, MD   1 application at [id number] 2153   losartan (COZAAR) tablet 50 mg  50 mg Oral Daily Michael Boston, MD   50 mg at 09/26/21 1030   magic mouthwash  15 mL Oral QID PRN Michael Boston, MD       melatonin tablet 3 mg  3 mg Oral QHS PRN Michael Boston, MD       methocarbamol (ROBAXIN) 1,000 mg in dextrose 5 % 100 mL IVPB  1,000 mg Intravenous Q6H PRN Michael Boston, MD       methocarbamol (ROBAXIN) tablet 1,000 mg  1,000 mg Oral Q6H PRN Michael Boston, MD       metoprolol tartrate (LOPRESSOR) injection 5 mg  5 mg Intravenous Q6H PRN Michael Boston, MD       ondansetron [facility]) tablet 4 mg  4 mg Oral Q6H PRN Michael Boston, MD       Or   ondansetron [facility]) injection 4 mg  4 mg Intravenous Q6H PRN Michael Boston, MD       oxybutynin (DITROPAN-XL) 24 hr tablet 5 mg  5 mg Oral Daily Michael Boston, MD   5 mg at 09/26/21 1030   pantoprazole (PROTONIX) EC tablet 40 mg  40 mg Oral Daily Michael Boston, MD   40 mg at 09/26/21 1030   polycarbophil (FIBERCON) tablet 625 mg  625 mg Oral BID Michael Boston, MD   625 mg at 09/26/21 2152   prochlorperazine (COMPAZINE) tablet 10 mg  10 mg Oral Q6H PRN Michael Boston, MD       Or   prochlorperazine (COMPAZINE)  injection 5-10 mg  5-10 mg Intravenous Q6H PRN Michael Boston, MD       rosuvastatin (CRESTOR) tablet 5 mg  5 mg Oral Ardeen Fillers, MD   5 mg at 09/26/21 2152   simethicone (MYLICON) chewable tablet 40 mg  40 mg Oral Q6H PRN Michael Boston, MD       tamsulosin [facility]) capsule 0.4 mg  0.4 mg Oral Daily Michael Boston, MD   0.4 mg at 09/26/21 1030   traMADol (ULTRAM) tablet 50-100 mg  50-100 mg Oral Q6H PRN Michael Boston, MD       trifluoperazine [facility]) tablet 5 mg  5 mg Oral Ardeen Fillers, MD   5 mg at 09/26/21 2152     No Known Allergies  Signed: Morton Peters, MD, FACS, MASCRS Esophageal, Gastrointestinal & Colorectal Surgery Robotic and Minimally Invasive Surgery  [facility],  [facility]  [facility]  [address] [place], [place] [address] [phone] Fax [phone] Main  CONTACT INFORMATION:  Weekday (9AM-5PM): Call [facility] main office at [phone]  Weeknight (5PM-9AM) or Weekend/Holiday: Check [email or web address] (password " [email or web address]") for General Surgery [facility] coverage  (Please, do not use SecureChat as it is not reliable communication to operating surgeons for immediate patient care)      09/27/2021, 8:21 AM

## 2021-09-27 NOTE — Progress Notes (Signed)
Patient and sister were given discharge instructions, and all questions were answered. Patient was stable for discharge and was taken to the main exit by wheelchair.

## 2021-09-27 NOTE — Progress Notes (Signed)
Brandon [name] [id number] 1946-06-24  CARE TEAM:  PCP: Karl Ito, DO  Outpatient Care Team: Patient Care Team: Karl Ito, DO as PCP - General (General Practice) Meriam Sprague, MD as PCP - Cardiology (Cardiology) Rachael Fee, MD as Attending Physician (Gastroenterology) Karie Soda, MD as Consulting Physician (Colon and Rectal Surgery) Griselda Miner, MD as Consulting Physician (General Surgery) Meriam Sprague, MD as Consulting Physician (Cardiology) Estanislado Emms, MD as Consulting Physician (Nephrology)  Inpatient Treatment Team: Treatment Team: Attending Provider: Karie Soda, MD; Charge Nurse: Ivan Anchors, RN; Pharmacist: Winfield Rast, [facility]; Physical Therapist: Enzo Montgomery, PT; Utilization Review: Oval Linsey, RN   Problem List:   Principal Problem:   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[facility]) Active Problems:   Schizophrenia (HCC)   Chronic venous insufficiency   Laryngopharyngeal reflux (LPR)   Stage 3a chronic kidney disease (HCC)   Volvulus of large intestine (HCC)   4 Days Post-Op  09/23/2021  POST-OPERATIVE DIAGNOSIS:   COLOSTOMY FOR COLON RESECTION FOR VOLVULUS DESIRE FOR OSTOMY TAKEDOWN   PROCEDURE:  XI ROBOTIC ASSISTED COLOSTOMY TAKEDOWN  ROBOTIC LYSIS OF ADHESIONS RIGID PROCTOSCOPY TRANSVERSUS ABDOMINIS PLANE (TAP) BLOCK - BILATERAL   SURGEON:  Ardeth Sportsman, MD  OR FINDINGS:        Moderate adhesions especially small intestine to anterior abdominal wall.  No definite obstructions.  Very long but not inflamed appendix.  Rectal stump at sacral promontory.  No trimming done.  It is a 31 EEA descending to proximal rectum anastomosis 12 cm from anal verge      Assessment  Recovering well  [facility] Stay = 4 days)  Plan:  -ERAS. -Soild diet -Drains and tubes removed. -VTE prophylaxis- SCDs, etc -mobilize as tolerated to help recovery.  He struggles with limited mobility with strictures and chronic venous  insufficiency.    Ask physical and Occupational Therapy for evaluation as suspect he would benefit from some home health at least -I ordered this per therapy recommendation  Disposition:  Disposition:  The patient is from: Home  Anticipate discharge to:  Home with Home Health  Anticipated Date of Discharge is:  December 12,2022    Barriers to discharge:  Pending Clinical improvement (more likely than not)  Patient currently is NOT MEDICALLY STABLE for discharge from the hospital from a surgery standpoint.      25 minutes spent in review, evaluation, examination, counseling, and coordination of care.   I have reviewed this patient's available data, including medical history, events of note, physical examination and test results as part of my evaluation.  A significant portion of that time was spent in counseling.  Care during the described time interval was provided by me.  09/27/2021    Subjective: (Chief complaint)  Patient feeling much better overall.  Tolerating oral intake well.  Have bowel movements.  A little bit of old blood but mostly solid.  Mild oozing where colostomy wound wicks were removed but it is quite now.  Feels ready to go home  Objective:  Vital signs:  Vitals:   09/26/21 1349 09/26/21 2139 09/27/21 0500 09/27/21 0553  BP: 130/81 130/65  (!) 134/91  Pulse: (!) 108 82  97  Resp: 16 15  14   Temp: 98 F (36.7 C) (!) 97.5 F (36.4 C)  97.9 F (36.6 C)  TempSrc: Oral Oral  Oral  SpO2: 100% 100%  95%  Weight:   87 kg   Height:  Last BM Date: 09/26/21  Intake/Output   Yesterday:  12/11 0701 - 12/12 0700 In: 2160 [P.O.:2160] Out: 2500 [Urine:2500] This shift:  No intake/output data recorded.  Bowel function:  Flatus: YES  BM:  YES  Drain: (No drain)   Physical Exam:  General: Pt awake/alert in no acute distress.  Bright and alert.  Sitting up smiling.  Moving easily. Eyes: PERRL, normal EOM.  Sclera clear.  No  icterus Neuro: CN II-XII intact w/o focal sensory/motor deficits. Lymph: No head/neck/groin lymphadenopathy Psych:  No delerium/psychosis/paranoia.  Oriented x 4 HENT: Normocephalic, Mucus membranes moist.  No thrush Neck: Supple, No tracheal deviation.  No obvious thyromegaly Chest: No pain to chest wall compression.  Good respiratory excursion.  No audible wheezing CV:  Pulses intact.  Regular rhythm.  No major extremity edema MS: Normal AROM mjr joints.  No obvious deformity  Abdomen: Soft.  Nondistended.  Mildly tender at incisions only.  No evidence of peritonitis.  No incarcerated hernias.  Ext:   Some mild contractures in lower extremities stable.  No mjr edema.  No cyanosis Skin: No petechiae / purpurea.  No major sores.  Warm and dry    Results:   Cultures: Recent Results (from the past 720 hour(s))  SARS Coronavirus 2 (TAT 6-24 hrs)     Status: None   Collection Time: 09/21/21 12:00 AM  Result Value Ref Range Status   SARS Coronavirus 2 RESULT: NEGATIVE  Corrected    Comment: RESULT: NEGATIVESARS-CoV-2 INTERPRETATION:A NEGATIVE  test result means that SARS-CoV-2 RNA was not present in the specimen above the limit of detection of this test. This does not preclude a possible SARS-CoV-2 infection and should not be used as the  sole basis for patient management decisions. Negative results must be combined with clinical observations, patient history, and epidemiological information. Optimum specimen types and timing for peak viral levels during infections caused by SARS-CoV-2  have not been determined. Collection of multiple specimens or types of specimens may be necessary to detect virus. Improper specimen collection and handling, sequence variability under primers/probes, or organism present below the limit of detection may  lead to false negative results. Positive and negative predictive values of testing are highly dependent on prevalence. False negative test results are more  likely when prevalence of disease is high.The expected result is NEGATIVE.Fact S heet for  Healthcare Providers: LocalChronicle.no Sheet for Patients: SalonLookup.es Reference Range - Negative     Labs: Results for orders placed or performed during the hospital encounter of 09/23/21 (from the past 48 hour(s))  CBC     Status: Abnormal   Collection Time: 09/26/21  4:54 AM  Result Value Ref Range   WBC 9.6 4.0 - 10.5 K/uL   RBC 3.07 (L) 4.22 - 5.81 MIL/uL   Hemoglobin 8.5 (L) 13.0 - 17.0 g/dL   HCT 26.2 (L) 39.0 - 52.0 %   MCV 85.3 80.0 - 100.0 fL   MCH 27.7 26.0 - 34.0 pg   MCHC 32.4 30.0 - 36.0 g/dL   RDW 15.7 (H) 11.5 - 15.5 %   Platelets 149 (L) 150 - 400 K/uL   nRBC 0.0 0.0 - 0.2 %    Comment: Performed at [facility], [place] [address] [place], [place] [id number]  Basic metabolic panel     Status: Abnormal   Collection Time: 09/26/21  4:54 AM  Result Value Ref Range   Sodium 136 135 - 145 mmol/L   Potassium 3.8 3.5 - 5.1 mmol/L  Chloride 104 98 - 111 mmol/L   CO2 28 22 - 32 mmol/L   Glucose, Bld 91 70 - 99 mg/dL    Comment: Glucose reference range applies only to samples taken after fasting for at least 8 hours.   BUN 14 8 - 23 mg/dL   Creatinine, Ser 1.00 0.61 - 1.24 mg/dL   Calcium 8.4 (L) 8.9 - 10.3 mg/dL   GFR, Estimated >60 >60 mL/min    Comment: (NOTE) Calculated using the CKD-EPI Creatinine Equation (2021)    Anion gap 4 (L) 5 - 15    Comment: Performed at [facility], [place] [address], [place], [place] [address]  Magnesium     Status: None   Collection Time: 09/26/21  4:54 AM  Result Value Ref Range   Magnesium 1.9 1.7 - 2.4 mg/dL    Comment: Performed at [facility], [place] [address], [place], [place] [address]  CBC     Status: Abnormal   Collection Time: 09/27/21  4:27 AM  Result Value Ref Range   WBC 10.5 4.0 - 10.5 K/uL   RBC 3.29 (L) 4.22  - 5.81 MIL/uL   Hemoglobin 9.1 (L) 13.0 - 17.0 g/dL   HCT 27.9 (L) 39.0 - 52.0 %   MCV 84.8 80.0 - 100.0 fL   MCH 27.7 26.0 - 34.0 pg   MCHC 32.6 30.0 - 36.0 g/dL   RDW 15.5 11.5 - 15.5 %   Platelets 155 150 - 400 K/uL   nRBC 0.0 0.0 - 0.2 %    Comment: Performed at [facility], [place] [address] [place], [place] [address]    Imaging / Studies: No results found.  Medications / Allergies: per chart  Antibiotics: Anti-infectives (From admission, onward)    Start     Dose/Rate Route Frequency Ordered Stop   09/24/21 0100  cefoTEtan (CEFOTAN) 2 g in sodium chloride 0.9 % 100 mL IVPB        2 g 200 mL/hr over 30 Minutes Intravenous Every 12 hours 09/23/21 1728 09/24/21 0149   09/23/21 1400  neomycin (MYCIFRADIN) tablet 1,000 mg  Status:  Discontinued       See Hyperspace for full Linked Orders Report.   1,000 mg Oral 3 times per day 09/23/21 1015 09/23/21 1017   09/23/21 1400  metroNIDAZOLE (FLAGYL) tablet 1,000 mg  Status:  Discontinued       See Hyperspace for full Linked Orders Report.   1,000 mg Oral 3 times per day 09/23/21 1015 09/23/21 1017   09/23/21 1030  cefoTEtan (CEFOTAN) 2 g in sodium chloride 0.9 % 100 mL IVPB        2 g 200 mL/hr over 30 Minutes Intravenous On call to O.R. 09/23/21 1015 09/23/21 1322         Note: Portions of this report may have been transcribed using voice recognition software. Every effort was made to ensure accuracy; however, inadvertent computerized transcription errors may be present.   Any transcriptional errors that result from this process are unintentional.    Adin Hector, MD, FACS, MASCRS Esophageal, Gastrointestinal & Colorectal Surgery Robotic and Minimally Invasive Surgery  [facility], [place]  [place]. [address] [place], [place] [address] [phone] Fax [phone] Main  CONTACT INFORMATION:  Weekday (9AM-5PM): Call [facility] main  office at [phone]  Weeknight (5PM-9AM) or Weekend/Holiday: Check [email or web address] (password " [email or web address]") for General Surgery [facility] coverage  (Please, do  not use SecureChat as it is not reliable communication to operating surgeons for immediate patient care)      09/27/2021  8:19 AM

## 2021-09-28 ENCOUNTER — Encounter: Payer: Self-pay | Admitting: Gastroenterology

## 2021-10-22 ENCOUNTER — Other Ambulatory Visit: Payer: Self-pay

## 2021-10-22 ENCOUNTER — Ambulatory Visit (INDEPENDENT_AMBULATORY_CARE_PROVIDER_SITE_OTHER): Payer: Medicare Other | Admitting: Podiatry

## 2021-10-22 DIAGNOSIS — I739 Peripheral vascular disease, unspecified: Secondary | ICD-10-CM | POA: Diagnosis not present

## 2021-10-22 DIAGNOSIS — M79675 Pain in left toe(s): Secondary | ICD-10-CM

## 2021-10-22 DIAGNOSIS — L84 Corns and callosities: Secondary | ICD-10-CM

## 2021-10-22 DIAGNOSIS — B351 Tinea unguium: Secondary | ICD-10-CM | POA: Diagnosis not present

## 2021-10-22 DIAGNOSIS — M79674 Pain in right toe(s): Secondary | ICD-10-CM | POA: Diagnosis not present

## 2021-10-28 ENCOUNTER — Encounter: Payer: Self-pay | Admitting: Podiatry

## 2021-10-28 NOTE — Progress Notes (Signed)
Subjective: Brandon [name] is a 76 y.o. male patient seen today for follow up of  at risk foot care with h/o PAD. He is seen for painful thick toenails that are difficult to trim. Pain interferes with ambulation. Aggravating factors include wearing enclosed shoe gear. Pain is relieved with periodic professional debridement.  New problems reported today: None.  PCP is Karl Ito, DO. Last visit was: "some time last year".  No Known Allergies  Objective: Physical Exam  General: Patient is a pleasant 76 y.o. African American male WD, WN in NAD. AAO x 3.   Neurovascular Examination: CFT <4 seconds b/l LE. Diminished DP/PT pulses b/l. Pedal hair absent b/l. Skin temperature gradient warm to cool b/l. No pain with calf compression b/l. No cyanosis or clubbing noted b/l LE. Varicosities present b/l.  Protective sensation intact 5/5 intact bilaterally with 10g monofilament b/l. Vibratory sensation intact b/l.  Dermatological:  No open wounds b/l lower extremities. No interdigital macerations noted b/l LE. Toenails 1-5 b/l elongated, discolored, dystrophic, thickened, crumbly with subungual debris and tenderness to dorsal palpation. Hyperkeratotic lesion(s) bilateral great toes.  No erythema, no edema, no drainage, no fluctuance.  Musculoskeletal:  Muscle strength 4/5 to all lower extremity muscle groups bilaterally. Hammertoe deformity noted 1-5 b/l. Pes planus deformity noted bilateral LE. Utilizes walker for ambulation assistance.  Assessment: 1. Pain due to onychomycosis of toenails of both feet   2. Callus   3. PAD (peripheral artery disease) (HCC)    Plan: Patient was evaluated and treated and all questions answered. Consent given for treatment as described below: -No new findings. No new orders. -Mycotic toenails 1-5 bilaterally were debrided in length and girth with sterile nail nippers and dremel without incident. -Callus(es) bilateral great toes pared utilizing sterile scalpel  blade without complication or incident. Total number debrided =2. -Patient/POA to call should there be question/concern in the interim.  Return in about 3 months (around 01/20/2022).  Freddie Breech, DPM

## 2021-11-03 ENCOUNTER — Telehealth: Payer: Self-pay | Admitting: Cardiology

## 2022-01-21 ENCOUNTER — Ambulatory Visit: Payer: Medicare Other | Admitting: Cardiology

## 2022-01-25 ENCOUNTER — Ambulatory Visit: Payer: Medicare Other | Admitting: Podiatry

## 2022-01-25 ENCOUNTER — Encounter: Payer: Self-pay | Admitting: Podiatry

## 2022-01-25 DIAGNOSIS — L84 Corns and callosities: Secondary | ICD-10-CM | POA: Diagnosis not present

## 2022-01-25 DIAGNOSIS — B351 Tinea unguium: Secondary | ICD-10-CM | POA: Diagnosis not present

## 2022-01-25 DIAGNOSIS — M79674 Pain in right toe(s): Secondary | ICD-10-CM

## 2022-01-25 DIAGNOSIS — M79675 Pain in left toe(s): Secondary | ICD-10-CM | POA: Diagnosis not present

## 2022-01-25 DIAGNOSIS — I739 Peripheral vascular disease, unspecified: Secondary | ICD-10-CM | POA: Diagnosis not present

## 2022-01-31 NOTE — Progress Notes (Signed)
?  Subjective:  ?Patient ID: Brandon [name], male    DOB: [birth year],  MRN: [id number] ? ?Brandon [name] presents to clinic today for for at risk foot care. Patient has h/o PAD and callus(es) left great toe and right great toe and painful thick toenails that are difficult to trim. Painful toenails interfere with ambulation. Aggravating factors include wearing enclosed shoe gear. Pain is relieved with periodic professional debridement. Painful calluses are aggravated when weightbearing with and without shoegear. Pain is relieved with periodic professional debridement. ? ?Patient is accompanied by his sister, Brandon [name], who also has an appointment on today. ? ?New problem(s): None.  ? ?PCP is Karl Ito, DO , and last visit was March, 2023. ? ?No Known Allergies ? ?Review of Systems: Negative except as noted in the HPI. ? ?Objective: No changes noted in today's physical examination. ?General: Patient is a pleasant 76 y.o. African American male WD, WN in NAD. AAO x 3.  ? ?Neurovascular Examination: ?CFT <4 seconds b/l LE. Diminished DP/PT pulses b/l. Pedal hair absent b/l. Skin temperature gradient warm to cool b/l. No pain with calf compression b/l. No cyanosis or clubbing noted b/l LE. Varicosities present b/l. ? ?Protective sensation intact 5/5 intact bilaterally with 10g monofilament b/l. Vibratory sensation intact b/l. ? ?Dermatological:  ?No open wounds b/l lower extremities. No interdigital macerations noted b/l LE. Toenails 1-5 b/l elongated, discolored, dystrophic, thickened, crumbly with subungual debris and tenderness to dorsal palpation. Hyperkeratotic lesion(s) bilateral great toes.  No erythema, no edema, no drainage, no fluctuance. ? ?Musculoskeletal:  ?Muscle strength 4/5 to all lower extremity muscle groups bilaterally. Hammertoe deformity noted 1-5 b/l. Pes planus deformity noted bilateral LE. Utilizes walker for ambulation assistance. ? ? ?  Latest Ref Rng & Units 05/08/2021  ?  2:24 PM   ?Hemoglobin A1C  ?Hemoglobin-A1c 4.8 - 5.6 % 5.9    ? ?Assessment/Plan: ?1. Pain due to onychomycosis of toenails of both feet   ?2. Callus   ?3. PAD (peripheral artery disease) (HCC)   ?  ?-Examined patient. ?-Toenails 1-5 b/l were debrided in length and girth with sterile nail nippers and dremel without iatrogenic bleeding.  ?-Callus(es) left great toe and right great toe pared utilizing sterile scalpel blade without complication or incident. Total number debrided =2. ?-Patient/POA to call should there be question/concern in the interim.  ? ?Return in about 3 months (around 04/26/2022). ? ?Freddie Breech, DPM  ?

## 2022-02-01 NOTE — Progress Notes (Deleted)
?Cardiology Office Note:   ? ?Date:  02/01/2022  ? ?ID:  Brandon [name], DOB 20-Jan-1946, MRN [id number] ? ?PCP:  Karl Ito, DO  ?[facility] Cardiologist:  Meriam Sprague, MD  ?[facility] Electrophysiologist:  None  ? ?Referring MD: Karl Ito, DO  ? ? ?History of Present Illness:   ? ?Brandon [name] is a 76 y.o. male with a hx of chronic venous insufficiency, HTN and shizophrenia who presents to clinic for follow-up. ? ?Was initially seen in clinic on 07/2020 for HTN and LE edema. Myoview 10/07/20 EF 59, no ischemia or infarction, low risk. TTE 10/07/20 with EF 60-65, no RWMA, Gr 1 DD, GLS -18.0%, normal RVSF, trivial MR. ? ?Last saw Brandon [name] 08/2021 where he was planned for an ostomy take down. No further testing needed at that time. Symptoms were stable from a CV standpoint,  ? ?Today, *** ? ?Past Medical History:  ?Diagnosis Date  ? Allergy   ? Arthritis   ? Bilateral knee swelling   ? Glaucoma   ? Hypertension   ? Prostate disorder   ? Schizophrenia (HCC)   ? Weakness of lower extremity   ? left knee, uses walker  ? ? ?Past Surgical History:  ?Procedure Laterality Date  ? ANKLE SURGERY  1997  ? had broken right ankle/had 3 surgeries in 1996/ has pins and rods  ? BOWEL DECOMPRESSION N/A 05/07/2021  ? Procedure: BOWEL DECOMPRESSION;  Surgeon: Meridee Score Netty Starring., MD;  Location: [facility];  Service: Gastroenterology;  Laterality: N/A;  ? BOWEL DECOMPRESSION N/A 05/06/2021  ? Procedure: BOWEL DECOMPRESSION;  Surgeon: Rachael Fee, MD;  Location: [facility];  Service: Endoscopy;  Laterality: N/A;  ? COLONOSCOPY WITH PROPOFOL N/A 05/06/2021  ? Procedure: COLONOSCOPY WITH PROPOFOL;  Surgeon: Rachael Fee, MD;  Location: [facility];  Service: Endoscopy;  Laterality: N/A;  ? FLEXIBLE SIGMOIDOSCOPY N/A 05/07/2021  ? Procedure: FLEXIBLE SIGMOIDOSCOPY;  Surgeon: Lemar Lofty., MD;  Location: [facility];  Service: Gastroenterology;  Laterality: N/A;  ? LAPAROTOMY N/A  05/08/2021  ? Procedure: EXPLORATORY LAPAROTOMY, SIGMOID COLECTOMY, COLOSTOMY;  Surgeon: Griselda Miner, MD;  Location: [facility];  Service: General;  Laterality: N/A;  ? LYSIS OF ADHESION N/A 09/23/2021  ? Procedure: ROBOTIC LYSIS OF ADHESIONS;  Surgeon: Karie Soda, MD;  Location: [facility];  Service: General;  Laterality: N/A;  ? PROCTOSCOPY N/A 09/23/2021  ? Procedure: RIGID PROCTOSCOPY;  Surgeon: Karie Soda, MD;  Location: [facility];  Service: General;  Laterality: N/A;  ? XI ROBOTIC ASSISTED COLOSTOMY TAKEDOWN N/A 09/23/2021  ? Procedure: XI ROBOTIC ASSISTED COLOSTOMY TAKEDOWN WITH BILATERAL TAP BLOCK;  Surgeon: Karie Soda, MD;  Location: [facility];  Service: General;  Laterality: N/A;  ? ? ?Current Medications: ?No outpatient medications have been marked as taking for the 02/03/22 encounter (Appointment) with Meriam Sprague, MD.  ?  ? ?Allergies:   Patient has no known allergies.  ? ?Social History  ? ?Socioeconomic History  ? Marital status: Single  ?  Spouse name: Not on file  ? Number of children: Not on file  ? Years of education: Not on file  ? Highest education level: Not on file  ?Occupational History  ? Not on file  ?Tobacco Use  ? Smoking status: Never  ? Smokeless tobacco: Never  ?Vaping Use  ? Vaping Use: Never used  ?Substance and Sexual Activity  ? Alcohol use: No  ? Drug use: No  ? Sexual activity: Not on file  ?Other  Topics Concern  ? Not on file  ?Social History Narrative  ? Not on file  ? ?Social Determinants of Health  ? ?Financial Resource Strain: Not on file  ?Food Insecurity: Not on file  ?Transportation Needs: Not on file  ?Physical Activity: Not on file  ?Stress: Not on file  ?Social Connections: Not on file  ?  ? ?Family History: ?The patient's family history includes Breast cancer in his father and mother; Kidney disease in his mother; Leukemia in his father. ? ?ROS:   ?Please see the history of present illness.    ?Review of Systems  ?Constitutional:  Negative for chills, fever and  malaise/fatigue.  ?HENT:  Negative for sore throat.   ?Eyes:  Negative for blurred vision.  ?Respiratory:  Positive for shortness of breath.   ?Cardiovascular:  Positive for leg swelling. Negative for chest pain, palpitations, orthopnea and PND.  ?Gastrointestinal:  Negative for abdominal pain, blood in stool, nausea and vomiting.  ?Genitourinary:  Negative for hematuria.  ?Musculoskeletal:  Positive for joint pain.  ?Neurological:  Negative for loss of consciousness and weakness.  ?Psychiatric/Behavioral:  Negative for depression.   ? ?EKGs/Labs/Other Studies Reviewed:   ? ?The following studies were reviewed today: ?Myoview 10/07/20 ?EF 59, no ischemia or infarction, low risk  ?  ?Echocardiogram  10/07/20 ?EF 60-65, no RWMA, Gr 1 DD, GLS -18.0%, normal RVSF, trivial MR ?  ? ?EKG:  EKG is  ordered today.  The ekg ordered today demonstrates Sinus tachycardia with HR 113, borderline RAD, LVH ? ?Recent Labs: ?05/07/2021: ALT 35 ?09/26/2021: BUN 14; Creatinine, Ser 1.00; Magnesium 1.9; Potassium 3.8; Sodium 136 ?09/27/2021: Hemoglobin 9.1; Platelets 155  ?Recent Lipid Panel ?No results found for: CHOL, TRIG, HDL, CHOLHDL, VLDL, LDLCALC, LDLDIRECT ? ? ? ?Physical Exam:   ? ?VS:  There were no vitals taken for this visit.   ? ?Wt Readings from Last 3 Encounters:  ?09/27/21 191 lb 12.8 oz (87 kg)  ?09/22/21 209 lb (94.8 kg)  ?09/17/21 200 lb (90.7 kg)  ?  ? ?GEN: Comfortable, SOB with minimal exertion ?HEENT: Normal ?NECK: No JVD; No carotid bruits ?CARDIAC: Tachycardic, regular, no murmurs ?RESPIRATORY:  Clear to auscultation without rales, wheezing or rhonchi  ?ABDOMEN: Soft, non-tender, non-distended ?MUSCULOSKELETAL:  Warm, 2+ pitting edema to mid-shin  ?SKIN: Warm and dry ?NEUROLOGIC:  Alert and oriented x 3 ?PSYCHIATRIC:  Normal affect  ? ?ASSESSMENT:   ? ?No diagnosis found. ? ?PLAN:   ? ?In order of problems listed above: ? ?#Dyspnea on Exertion: ?Reassuring cardiac workup with normal myoview and TTE with EF 60-65,  no RWMA, Gr 1 DD, GLS -18.0%, normal RVSF, trivial MR. Suspect due to deconditioning.  ? ?#LE edema: ?#CKD: ?Not on diuretics.  ?-Follow-up with renal ? ?#Hypertension: ?Managed by PCP. ?-Continue losartan 50mg  daily ? ?#HLD: ?Managed by PCP ?-Continue atorvastatin 20mg  daily ? ? ?Medication Adjustments/Labs and Tests Ordered: ?Current medicines are reviewed at length with the patient today.  Concerns regarding medicines are outlined above.  ?No orders of the defined types were placed in this encounter. ? ?No orders of the defined types were placed in this encounter. ? ? ?There are no Patient Instructions on file for this visit. ?  ? ?Signed, ? , MD  ?02/01/2022 7:51 PM    ?[facility] ?

## 2022-02-03 ENCOUNTER — Encounter: Payer: Self-pay | Admitting: Cardiology

## 2022-02-03 ENCOUNTER — Ambulatory Visit: Payer: Medicare Other | Admitting: Cardiology

## 2022-02-03 VITALS — BP 138/80 | HR 120 | Ht 72.0 in | Wt 197.8 lb

## 2022-02-03 DIAGNOSIS — R000 Tachycardia, unspecified: Secondary | ICD-10-CM | POA: Diagnosis not present

## 2022-02-03 DIAGNOSIS — I872 Venous insufficiency (chronic) (peripheral): Secondary | ICD-10-CM

## 2022-02-03 DIAGNOSIS — I10 Essential (primary) hypertension: Secondary | ICD-10-CM

## 2022-02-03 DIAGNOSIS — N1831 Chronic kidney disease, stage 3a: Secondary | ICD-10-CM

## 2022-02-03 DIAGNOSIS — R0609 Other forms of dyspnea: Secondary | ICD-10-CM | POA: Diagnosis not present

## 2022-02-03 LAB — D-DIMER, QUANTITATIVE: D-DIMER: 1.92 mg/L FEU — ABNORMAL HIGH (ref 0.00–0.49)

## 2022-02-03 MED ORDER — METOPROLOL SUCCINATE ER 25 MG PO TB24: 25.0000 mg | ORAL_TABLET | Freq: Every day | ORAL | 3 refills | Status: DC

## 2022-02-03 NOTE — Progress Notes (Signed)
?Cardiology Office Note:   ? ?Date:  02/03/2022  ? ?ID:  Brandon [name], DOB Apr 21, 1946, MRN [id number] ? ?PCP:  [name], MD  ?[facility] Cardiologist:  [name], MD  ?[facility] Electrophysiologist:  None  ? ?Referring MD: [name], DO  ? ? ?History of Present Illness:   ? ?Brandon [name] is a 76 y.o. male with a hx of chronic venous insufficiency, HTN and shizophrenia who presents to clinic for follow-up. ? ?Was initially seen in clinic on 07/2020 for HTN and LE edema. Myoview 10/07/20 EF 59, no ischemia or infarction, low risk. TTE 10/07/20 with EF 60-65, no RWMA, Gr 1 DD, GLS -18.0%, normal RVSF, trivial MR. ? ?Last saw Richardson Dopp 08/2021 where he was planned for an ostomy take down. No further testing needed at that time. Symptoms were stable from a CV standpoint. ? ?He is accompanied by his sister. Today, the patient states that he is feeling fine, with better energy. His swelling has improved significantly, and he endorses a hearty appetite. ? ?He does not feel any racing heart beats, and he denies feeling short winded at this time. Sometimes when he walks he is a little short of breath. On exam he is tachycardic around 110 bpm, slowed down to high 90s bpm with deep inspiration. ? ?He states his BP is pretty good at home. Lately they are not monitoring his BP regularly. No bleeding issues on aspirin. ? ?Typically he goes to bed late, around 1 AM. ? ?He denies any chest pain. No lightheadedness, headaches, syncope, orthopnea, or PND. ? ?He is a former smoker, quit when he was a teenager. ? ?Past Medical History:  ?Diagnosis Date  ? Allergy   ? Arthritis   ? Bilateral knee swelling   ? Glaucoma   ? Hypertension   ? Prostate disorder   ? Schizophrenia ([place])   ? Weakness of lower extremity   ? left knee, uses walker  ? ? ?Past Surgical History:  ?Procedure Laterality Date  ? [place]  ? had broken right ankle/had 3 surgeries in 1996/ has pins and rods  ? BOWEL  DECOMPRESSION N/A 05/07/2021  ? Procedure: BOWEL DECOMPRESSION;  Surgeon: Rush Landmark Telford Nab., MD;  Location: [facility];  Service: Gastroenterology;  Laterality: N/A;  ? BOWEL DECOMPRESSION N/A 05/06/2021  ? Procedure: BOWEL DECOMPRESSION;  Surgeon: Milus Banister, MD;  Location: [facility];  Service: Endoscopy;  Laterality: N/A;  ? COLONOSCOPY WITH PROPOFOL N/A 05/06/2021  ? Procedure: COLONOSCOPY WITH PROPOFOL;  Surgeon: Milus Banister, MD;  Location: [facility];  Service: Endoscopy;  Laterality: N/A;  ? FLEXIBLE SIGMOIDOSCOPY N/A 05/07/2021  ? Procedure: FLEXIBLE SIGMOIDOSCOPY;  Surgeon: Irving Copas., MD;  Location: [facility];  Service: Gastroenterology;  Laterality: N/A;  ? LAPAROTOMY N/A 05/08/2021  ? Procedure: EXPLORATORY LAPAROTOMY, SIGMOID COLECTOMY, COLOSTOMY;  Surgeon: Jovita Kussmaul, MD;  Location: [facility];  Service: General;  Laterality: N/A;  ? LYSIS OF ADHESION N/A 09/23/2021  ? Procedure: ROBOTIC LYSIS OF ADHESIONS;  Surgeon: Michael Boston, MD;  Location: [facility];  Service: General;  Laterality: N/A;  ? PROCTOSCOPY N/A 09/23/2021  ? Procedure: RIGID PROCTOSCOPY;  Surgeon: Michael Boston, MD;  Location: [facility];  Service: General;  Laterality: N/A;  ? XI ROBOTIC ASSISTED COLOSTOMY TAKEDOWN N/A 09/23/2021  ? Procedure: XI ROBOTIC ASSISTED COLOSTOMY TAKEDOWN WITH BILATERAL TAP BLOCK;  Surgeon: Michael Boston, MD;  Location: [facility];  Service: General;  Laterality: N/A;  ? ? ?  Current Medications: ?Current Meds  ?Medication Sig  ? acetaminophen (TYLENOL) 650 MG CR tablet Take 650 mg by mouth every 8 (eight) hours as needed for pain.  ? aspirin EC 81 MG tablet Take 81 mg by mouth daily.  ? buPROPion (WELLBUTRIN SR) 150 MG 12 hr tablet Take 150 mg by mouth daily.  ? Cholecalciferol (VITAMIN D3) 25 MCG (1000 UT) CAPS Take 2,000 Units by mouth daily.  ? Cod Liver Oil 1000 MG CAPS Take 1,000 mg by mouth daily.  ? finasteride (PROSCAR) 5 MG tablet Take 5 mg by mouth daily.  ? fluticasone  (FLONASE) 50 MCG/ACT nasal spray Place 1 spray into both nostrils daily as needed for allergies or rhinitis.  ? losartan (COZAAR) 50 MG tablet Take 50 mg by mouth daily.  ? metoprolol succinate (TOPROL XL) 25 MG 24 hr tablet Take 1 tablet (25 mg total) by mouth at bedtime.  ? Omega-3 Fatty Acids (FISH OIL) 1000 MG CAPS Take 1,000 mg by mouth daily.  ? omeprazole (PRILOSEC) 20 MG capsule Take 20 mg by mouth daily.  ? oxybutynin (DITROPAN-XL) 5 MG 24 hr tablet Take 5 mg by mouth daily.  ? tamsulosin (FLOMAX) 0.4 MG CAPS capsule Take 0.4 mg by mouth daily.  ? Travoprost, BAK Free, (TRAVATAN) 0.004 % SOLN ophthalmic solution Place 1 drop into both eyes at bedtime.  ? trifluoperazine (STELAZINE) 5 MG tablet Take 5 mg by mouth at bedtime.  ?  ? ?Allergies:   Patient has no known allergies.  ? ?Social History  ? ?Socioeconomic History  ? Marital status: Single  ?  Spouse name: Not on file  ? Number of children: Not on file  ? Years of education: Not on file  ? Highest education level: Not on file  ?Occupational History  ? Not on file  ?Tobacco Use  ? Smoking status: Never  ? Smokeless tobacco: Never  ?Vaping Use  ? Vaping Use: Never used  ?Substance and Sexual Activity  ? Alcohol use: No  ? Drug use: No  ? Sexual activity: Not on file  ?Other Topics Concern  ? Not on file  ?Social History Narrative  ? Not on file  ? ?Social Determinants of Health  ? ?Financial Resource Strain: Not on file  ?Food Insecurity: Not on file  ?Transportation Needs: Not on file  ?Physical Activity: Not on file  ?Stress: Not on file  ?Social Connections: Not on file  ?  ? ?Family History: ?The patient's family history includes Breast cancer in his father and mother; Kidney disease in his mother; Leukemia in his father. ? ?ROS:   ?Please see the history of present illness.    ?Review of Systems  ?Constitutional:  Negative for chills, fever and malaise/fatigue.  ?HENT:  Negative for sore throat.   ?Eyes:  Negative for blurred vision.  ?Respiratory:   Positive for shortness of breath.   ?Cardiovascular:  Negative for chest pain, palpitations, orthopnea, leg swelling and PND.  ?Gastrointestinal:  Negative for abdominal pain, blood in stool, nausea and vomiting.  ?Genitourinary:  Negative for hematuria.  ?Musculoskeletal:  Positive for joint pain.  ?Neurological:  Negative for loss of consciousness and weakness.  ?Endo/Heme/Allergies:  Negative for polydipsia.  ?Psychiatric/Behavioral:  Negative for depression.   ? ?EKGs/Labs/Other Studies Reviewed:   ? ?The following studies were reviewed today: ? ?Myoview 10/07/20 ?EF 59, no ischemia or infarction, low risk  ?  ?Echocardiogram  10/07/20 ?EF 60-65, no RWMA, Gr 1 DD, GLS -18.0%, normal RVSF, trivial  MR ?  ? ?EKG:  EKG is personally reviewed. ?02/03/2022: Sinus tachycardia. Rate 120 bpm. Right axis deviation. ?08/11/2020: Sinus tachycardia with HR 113, borderline RAD, LVH ? ?Recent Labs: ?05/07/2021: ALT 35 ?09/26/2021: BUN 14; Creatinine, Ser 1.00; Magnesium 1.9; Potassium 3.8; Sodium 136 ?09/27/2021: Hemoglobin 9.1; Platelets 155  ? ?Recent Lipid Panel ?No results found for: CHOL, TRIG, HDL, CHOLHDL, VLDL, LDLCALC, LDLDIRECT ? ? ? ?Physical Exam:   ? ?VS:  BP 138/80   Pulse (!) 120   Ht 6' (1.829 m)   Wt 197 lb 12.8 oz (89.7 kg)   SpO2 98%   BMI 26.83 kg/m?    ? ?Wt Readings from Last 3 Encounters:  ?02/03/22 197 lb 12.8 oz (89.7 kg)  ?09/27/21 191 lb 12.8 oz (87 kg)  ?09/22/21 209 lb (94.8 kg)  ?  ? ?GEN: Comfortable, NAD ?HEENT: Normal ?NECK: No JVD; No carotid bruits ?CARDIAC: Tachycardic, regular, no murmurs ?RESPIRATORY:  Clear to auscultation without rales, wheezing or rhonchi  ?ABDOMEN: Soft, non-tender, non-distended ?MUSCULOSKELETAL:  Warm, trace-1+ pitting edema to mid-shin  ?SKIN: Warm and dry ?NEUROLOGIC:  Alert and oriented x 3 ?PSYCHIATRIC:  Normal affect  ? ?ASSESSMENT:   ? ?1. Sinus tachycardia   ?2. Tachycardia   ?3. Chronic venous insufficiency   ?4. Dyspnea on exertion   ?5. Essential  hypertension   ?6. Stage 3a chronic kidney disease ([place])   ?7. Primary hypertension   ? ? ?PLAN:   ? ?In order of problems listed above: ? ?#Dyspnea on Exertion: ?Normal myoview and TTE with EF 60-65, no RWMA, Gr 1 DD, GLS -1

## 2022-02-03 NOTE — Patient Instructions (Signed)
Medication Instructions:  ? ?START TAKING METOPROLOL SUCCINATE (TOPROL XL) 25 MG BY MOUTH DAILY AT BEDTIME ? ?*If you need a refill on your cardiac medications before your next appointment, please call your pharmacy* ? ? ?Lab Work: ? ?TODAY--D-DIMER ? ?If you have labs (blood work) drawn today and your tests are completely normal, you will receive your results only by: ?MyChart Message (if you have MyChart) OR ?A paper copy in the mail ?If you have any lab test that is abnormal or we need to change your treatment, we will call you to review the results. ? ? ?Follow-Up: ?At [facility], you and your health needs are our priority.  As part of our continuing mission to provide you with exceptional heart care, we have created designated Provider Care Teams.  These Care Teams include your primary Cardiologist (physician) and Advanced Practice Providers (APPs -  Physician Assistants and Nurse Practitioners) who all work together to provide you with the care you need, when you need it. ? ?We recommend signing up for the patient portal called "MyChart".  Sign up information is provided on this After Visit Summary.  MyChart is used to connect with patients for Virtual Visits (Telemedicine).  Patients are able to view lab/test results, encounter notes, upcoming appointments, etc.  Non-urgent messages can be sent to your provider as well.   ?To learn more about what you can do with MyChart, go to [email or web address].   ? ?Your next appointment:   ?6 month(s) ? ?The format for your next appointment:   ?In Person ? ?Provider:   ?Robbie Lis, PA-C, Nicholes Rough, PA-C, Melina Copa, PA-C, Ambrose Pancoast, NP, Cecilie Kicks, NP, Ermalinda Barrios, PA-C, Christen Bame, NP, or Richardson Dopp, PA-C     { ? ? ? ?Important Information About Sugar ? ? ? ? ? ? ?

## 2022-02-04 ENCOUNTER — Other Ambulatory Visit: Payer: Self-pay | Admitting: *Deleted

## 2022-02-04 ENCOUNTER — Encounter: Payer: Self-pay | Admitting: *Deleted

## 2022-02-04 ENCOUNTER — Telehealth: Payer: Self-pay

## 2022-02-04 DIAGNOSIS — R072 Precordial pain: Secondary | ICD-10-CM

## 2022-02-04 DIAGNOSIS — R0602 Shortness of breath: Secondary | ICD-10-CM

## 2022-02-04 DIAGNOSIS — R7989 Other specified abnormal findings of blood chemistry: Secondary | ICD-10-CM

## 2022-02-04 NOTE — Progress Notes (Signed)
Order changed to STAT with a BMET to be done at the hospital prior to appt.  ?

## 2022-02-04 NOTE — Telephone Encounter (Signed)
Pt is scheduled for CT ANGIO Chest PE for next Monday 4/24 at 4 pm at [facility].  Pt to arrive at 3 pm for ISTAT creatinine to be done.  [facility] Scheduler made the pt and sister aware of appt date and time.  ?

## 2022-02-04 NOTE — Telephone Encounter (Signed)
Unable to reach patient at primary number on file, reached out to sister Neoma [facility]). Pt present in background at time of call. Pt understands results and need for CT to rule out PE. Order placed at this time and routed to [facility] for scheduling.  ? ?Pt would like a copy of lab results mailed to home. Will make primary RN aware. ?

## 2022-02-04 NOTE — Telephone Encounter (Signed)
-----   Message from [name], MD sent at 02/04/2022 10:29 AM EDT ----- ?D-dimer is elevated. We should get CT-PE protocol to check for PE. ?

## 2022-02-07 ENCOUNTER — Ambulatory Visit (HOSPITAL_COMMUNITY)
Admission: RE | Admit: 2022-02-07 | Discharge: 2022-02-07 | Disposition: A | Discharge status: Home / Self Care | Payer: Medicare Other | Source: Ambulatory Visit | Attending: Cardiology | Admitting: Cardiology

## 2022-02-07 DIAGNOSIS — R7989 Other specified abnormal findings of blood chemistry: Secondary | ICD-10-CM | POA: Insufficient documentation

## 2022-02-07 LAB — POCT I-STAT CREATININE: Creatinine, Ser: 1.4 mg/dL — ABNORMAL HIGH (ref 0.61–1.24)

## 2022-02-07 MED ORDER — IOHEXOL 350 MG/ML SOLN
100.0000 mL | Freq: Once | INTRAVENOUS | Status: AC | PRN
  Administered 2022-02-07: 75 mL via INTRAVENOUS

## 2022-05-02 ENCOUNTER — Ambulatory Visit: Payer: Medicare Other | Admitting: Podiatry

## 2022-05-03 ENCOUNTER — Encounter: Payer: Self-pay | Admitting: Podiatry

## 2022-05-03 ENCOUNTER — Ambulatory Visit: Payer: Medicare Other | Admitting: Podiatry

## 2022-05-03 DIAGNOSIS — M79674 Pain in right toe(s): Secondary | ICD-10-CM

## 2022-05-03 DIAGNOSIS — M79675 Pain in left toe(s): Secondary | ICD-10-CM | POA: Diagnosis not present

## 2022-05-03 DIAGNOSIS — I739 Peripheral vascular disease, unspecified: Secondary | ICD-10-CM | POA: Diagnosis not present

## 2022-05-03 DIAGNOSIS — L84 Corns and callosities: Secondary | ICD-10-CM

## 2022-05-03 DIAGNOSIS — B351 Tinea unguium: Secondary | ICD-10-CM

## 2022-05-08 NOTE — Progress Notes (Signed)
  Subjective:  Patient ID: Brandon [name], male    DOB: [birth year],  MRN: [id number]  MERLE CIRELLI presents to clinic today for for at risk foot care. Patient has h/o PAD and painful elongated mycotic toenails 1-5 bilaterally which are tender when wearing enclosed shoe gear. Pain is relieved with periodic professional debridement.  New problem(s): None.   PCP is Ellyn Hack, MD. No Known Allergies  Review of Systems: Negative except as noted in the HPI.  Objective: No changes noted in today's physical examination. General: Patient is a pleasant 76 y.o. African American male WD, WN in NAD. AAO x 3.   Neurovascular Examination: CFT <4 seconds b/l LE. Diminished DP/PT pulses b/l. Pedal hair absent b/l. Skin temperature gradient warm to cool b/l. No pain with calf compression b/l. No cyanosis or clubbing noted b/l LE. Varicosities present b/l.Trace edema noted b/l LE.  Protective sensation intact 5/5 intact bilaterally with 10g monofilament b/l. Vibratory sensation intact b/l.  Dermatological:  No open wounds b/l lower extremities. No interdigital macerations noted b/l LE. Toenails 1-5 b/l elongated, discolored, dystrophic, thickened, crumbly with subungual debris and tenderness to dorsal palpation. Hyperkeratotic lesion(s) bilateral great toes and distal tip of right 3rd digit.  No erythema, no edema, no drainage, no fluctuance.  Musculoskeletal:  Muscle strength 4/5 to all lower extremity muscle groups bilaterally. Hammertoe deformity noted 1-5 b/l. Pes planus deformity noted bilateral LE. Utilizes walker for ambulation assistance.  Assessment/Plan: 1. Pain due to onychomycosis of toenails of both feet   2. Corns and callosities   3. PAD (peripheral artery disease) (HCC)      -Patient was evaluated and treated. All patient's and/or POA's questions/concerns answered on today's visit. -Patient to continue soft, supportive shoe gear daily. -Mycotic toenails 1-5 bilaterally were  debrided in length and girth with sterile nail nippers and dremel without iatrogenic bleeding. -Corn(s) distal tip of right 3rd toe and callus(es) medial IPJ of right great toe and plantar IPJ of left great toe were pared utilizing sterile scalpel blade without incident. Total number debrided =3. -Patient/POA to call should there be question/concern in the interim.   Return in about 3 months (around 08/03/2022).  Freddie Breech, DPM

## 2022-08-09 ENCOUNTER — Ambulatory Visit: Payer: Medicare Other | Admitting: Podiatry

## 2022-08-12 NOTE — Progress Notes (Unsigned)
Cardiology Office Note:    Date:  08/12/2022   ID:  Brandon [name], DOB Nov 22, 1945, MRN [id number]  PCP:  Ellyn Hack, MD  [facility] Cardiologist:  Meriam Sprague, MD  [facility] Electrophysiologist:  None   Referring MD: Ellyn Hack, MD    History of Present Illness:    Brandon [name] is a 76 y.o. male with a hx of chronic venous insufficiency, HTN and shizophrenia who presents to clinic for follow-up.  Was initially seen in clinic on 07/2020 for HTN and LE edema. Myoview 10/07/20 EF 59, no ischemia or infarction, low risk. TTE 10/07/20 with EF 60-65, no RWMA, Gr 1 DD, GLS -18.0%, normal RVSF, trivial MR.  Saw Tereso Newcomer 08/2021 where he was planned for an ostomy take down. No further testing needed at that time. Symptoms were stable from a CV standpoint.  Was last seen 02/03/22 where he was doing well. Had sinus tachycardia and mild DOE. D-dimer was elevated prompting CTA chest, which was negative for PE.  Today, ***  Past Medical History:  Diagnosis Date   Allergy    Arthritis    Bilateral knee swelling    Glaucoma    Hypertension    Prostate disorder    Schizophrenia (HCC)    Weakness of lower extremity    left knee, uses walker    Past Surgical History:  Procedure Laterality Date   ANKLE SURGERY  1997   had broken right ankle/had 3 surgeries in 1996/ has pins and rods   BOWEL DECOMPRESSION N/A 05/07/2021   Procedure: BOWEL DECOMPRESSION;  Surgeon: Lemar Lofty., MD;  Location: [facility];  Service: Gastroenterology;  Laterality: N/A;   BOWEL DECOMPRESSION N/A 05/06/2021   Procedure: BOWEL DECOMPRESSION;  Surgeon: Rachael Fee, MD;  [facility];  Service: Endoscopy;  Laterality: N/A;   COLONOSCOPY WITH PROPOFOL N/A 05/06/2021   Procedure: COLONOSCOPY WITH PROPOFOL;  Surgeon: Rachael Fee, MD;  Location: [facility];  Service: Endoscopy;  Laterality: N/A;   FLEXIBLE SIGMOIDOSCOPY N/A 05/07/2021   Procedure:  FLEXIBLE SIGMOIDOSCOPY;  Surgeon: Meridee Score Netty Starring., MD;  Location: [facility];  Service: Gastroenterology;  Laterality: N/A;   LAPAROTOMY N/A 05/08/2021   Procedure: EXPLORATORY LAPAROTOMY, SIGMOID COLECTOMY, COLOSTOMY;  Surgeon: Griselda Miner, MD;  Location: [facility];  Service: General;  Laterality: N/A;   LYSIS OF ADHESION N/A 09/23/2021   Procedure: ROBOTIC LYSIS OF ADHESIONS;  Surgeon: Karie Soda, MD;  Location: [facility];  Service: General;  Laterality: N/A;   PROCTOSCOPY N/A 09/23/2021   Procedure: RIGID PROCTOSCOPY;  Surgeon: Karie Soda, MD;  Location: [facility];  Service: General;  Laterality: N/A;   XI ROBOTIC ASSISTED COLOSTOMY TAKEDOWN N/A 09/23/2021   Procedure: XI ROBOTIC ASSISTED COLOSTOMY TAKEDOWN WITH BILATERAL TAP BLOCK;  Surgeon: Karie Soda, MD;  Location: [facility];  Service: General;  Laterality: N/A;    Current Medications: No outpatient medications have been marked as taking for the 08/15/22 encounter (Appointment) with Meriam Sprague, MD.     Allergies:   Patient has no known allergies.   Social History   Socioeconomic History   Marital status: Single    Spouse name: Not on file   Number of children: Not on file   Years of education: Not on file   Highest education level: Not on file  Occupational History   Not on file  Tobacco Use   Smoking status: Never   Smokeless tobacco: Never  Vaping Use  Vaping Use: Never used  Substance and Sexual Activity   Alcohol use: No   Drug use: No   Sexual activity: Not on file  Other Topics Concern   Not on file  Social History Narrative   Not on file   Social Determinants of Health   Financial Resource Strain: Not on file  Food Insecurity: Not on file  Transportation Needs: Not on file  Physical Activity: Not on file  Stress: Not on file  Social Connections: Not on file     Family History: The patient's family history includes Breast cancer in his father and mother; Kidney disease in his mother;  Leukemia in his father.  ROS:   Please see the history of present illness.    Review of Systems  Constitutional:  Negative for chills, fever and malaise/fatigue.  HENT:  Negative for sore throat.   Eyes:  Negative for blurred vision.  Respiratory:  Positive for shortness of breath.   Cardiovascular:  Negative for chest pain, palpitations, orthopnea, leg swelling and PND.  Gastrointestinal:  Negative for abdominal pain, blood in stool, nausea and vomiting.  Genitourinary:  Negative for hematuria.  Musculoskeletal:  Positive for joint pain.  Neurological:  Negative for loss of consciousness and weakness.  Endo/Heme/Allergies:  Negative for polydipsia.  Psychiatric/Behavioral:  Negative for depression.     EKGs/Labs/Other Studies Reviewed:    The following studies were reviewed today: CTA PE protocol 01/2020: FINDINGS: Cardiovascular: Heart is enlarged in size. There is homogeneous enhancement in thoracic aorta. There are no intraluminal filling defects in central pulmonary artery branches. Evaluation of small peripheral pulmonary artery branches is limited by motion artifacts and less than optimal contrast enhancement.   Mediastinum/Nodes: No significant lymphadenopathy seen.   Lungs/Pleura: There is no focal pulmonary consolidation. There is no pleural effusion or pneumothorax.   Upper Abdomen: Small hiatal hernia is seen. There is fluid in the lumen of thoracic esophagus.   Musculoskeletal: There is levoscoliosis in the lower thoracic spine and mild dextroscoliosis in the upper thoracic spine. Degenerative changes are noted in the visualized lower cervical spine.   Review of the MIP images confirms the above findings.   IMPRESSION: There is no evidence of central pulmonary artery embolism. There is no evidence of thoracic aortic dissection. There is no focal pulmonary consolidation.   There is fluid in the lumen of thoracic esophagus suggesting gastroesophageal  reflux.      Myoview 10/07/20 EF 59, no ischemia or infarction, low risk    Echocardiogram  10/07/20 EF 60-65, no RWMA, Gr 1 DD, GLS -18.0%, normal RVSF, trivial MR    EKG:  EKG is personally reviewed. 02/03/2022: Sinus tachycardia. Rate 120 bpm. Right axis deviation. 08/11/2020: Sinus tachycardia with HR 113, borderline RAD, LVH  Recent Labs: 09/26/2021: BUN 14; Magnesium 1.9; Potassium 3.8; Sodium 136 09/27/2021: Hemoglobin 9.1; Platelets 155 02/07/2022: Creatinine, Ser 1.40   Recent Lipid Panel No results found for: "CHOL", "TRIG", "HDL", "CHOLHDL", "VLDL", "LDLCALC", "LDLDIRECT"    Physical Exam:    VS:  There were no vitals taken for this visit.    Wt Readings from Last 3 Encounters:  02/03/22 197 lb 12.8 oz (89.7 kg)  09/27/21 191 lb 12.8 oz (87 kg)  09/22/21 209 lb (94.8 kg)     GEN: Comfortable, NAD HEENT: Normal NECK: No JVD; No carotid bruits CARDIAC: Tachycardic, regular, no murmurs RESPIRATORY:  Clear to auscultation without rales, wheezing or rhonchi  ABDOMEN: Soft, non-tender, non-distended MUSCULOSKELETAL:  Warm, trace-1+ pitting edema to  mid-shin  SKIN: Warm and dry NEUROLOGIC:  Alert and oriented x 3 PSYCHIATRIC:  Normal affect   ASSESSMENT:    No diagnosis found.   PLAN:    In order of problems listed above:  #Dyspnea on Exertion: Normal myoview and TTE with EF 60-65, no RWMA, Gr 1 DD, GLS -18.0%, normal RVSF, trivial MR. CTA PE protocol 01/2022 negative for PE.  #Sinus Tachycardia: Has had elevated HR in the office since 2021. CT PE protocol negative.  -Continue metoprolol 25mg  XL qHS  #LE edema: #CKD: Not on diuretics.  -Follow-up with renal  #Hypertension: Managed by PCP. Mildly elevated today -Continue losartan 50mg  daily -Continue metop 25mg  XL daily as above given persistent sinus tach  #HLD: Managed by PCP -Continue atorvastatin 20mg  daily  Follow-up:  6 months.  Medication Adjustments/Labs and Tests  Ordered: Current medicines are reviewed at length with the patient today.  Concerns regarding medicines are outlined above.   No orders of the defined types were placed in this encounter.  No orders of the defined types were placed in this encounter.  There are no Patient Instructions on file for this visit.   I,Mathew Stumpf,acting as a for , MD.,have documented all relevant documentation on the behalf of , MD,as directed by  , MD while in the presence of Neurosurgeon, MD.  I, Meriam Sprague, MD, have reviewed all documentation for this visit. The documentation on 08/12/22 for the exam, diagnosis, procedures, and orders are all accurate and complete.    Signed, Meriam Sprague, MD  08/12/2022 8:07 PM    [facility]

## 2022-08-15 ENCOUNTER — Ambulatory Visit: Payer: Medicare Other | Attending: Cardiology | Admitting: Cardiology

## 2022-08-15 ENCOUNTER — Encounter: Payer: Self-pay | Admitting: Cardiology

## 2022-08-15 VITALS — BP 122/60 | HR 82 | Ht 74.0 in | Wt 205.2 lb

## 2022-08-15 DIAGNOSIS — N1831 Chronic kidney disease, stage 3a: Secondary | ICD-10-CM | POA: Diagnosis not present

## 2022-08-15 DIAGNOSIS — R0602 Shortness of breath: Secondary | ICD-10-CM | POA: Diagnosis not present

## 2022-08-15 DIAGNOSIS — I872 Venous insufficiency (chronic) (peripheral): Secondary | ICD-10-CM

## 2022-08-15 DIAGNOSIS — I10 Essential (primary) hypertension: Secondary | ICD-10-CM

## 2022-08-15 DIAGNOSIS — R000 Tachycardia, unspecified: Secondary | ICD-10-CM

## 2022-08-15 NOTE — Progress Notes (Signed)
Cardiology Office Note:    Date:  08/15/2022   ID:  Brandon [name], Brandon [name] 1946-07-31, MRN [id number]  PCP:  Brandon Nova, MD  [facility] Cardiologist:  Brandon Bergeron, MD  [facility] Electrophysiologist:  None   Referring MD: Brandon Nova, MD    History of Present Illness:    Brandon [name] is a 76 y.o. male with a hx of chronic venous insufficiency, HTN and shizophrenia who presents to clinic for follow-up.  Was initially seen in clinic on 07/2020 for HTN and LE edema. Myoview 10/07/20 EF 59, no ischemia or infarction, low risk. TTE 10/07/20 with EF 60-65, no RWMA, Gr 1 DD, GLS -18.0%, normal RVSF, trivial MR.  Saw Brandon [name] 08/2021 where he was planned for an ostomy take down. No further testing needed at that time. Symptoms were stable from a CV standpoint.  Was last seen 02/03/22 where he was doing well. Had sinus tachycardia and mild DOE. D-dimer was elevated prompting CTA chest, which was negative for PE.  Today, he is accompanied by his sister, Brandon [name] who is also a patient of mine. They both have appointments scheduled today and wish to be seen together.   The patient states that his breathing is alright today. He denies feeling any chest pain or pressure.  He has some bilateral LE edema. Sometimes he will wear his compression socks. When he does, he has noticed some improvement in his swelling.  For activity he is able to walk with his walker. He denies any recent falls.  He denies any palpitations, lightheadedness, headaches, syncope, orthopnea, or PND.  Since his last visit he underwent ostomy take down.  Of note he has received his flu vaccination.  Past Medical History:  Diagnosis Date   Allergy    Arthritis    Bilateral knee swelling    Glaucoma    Hypertension    Prostate disorder    Schizophrenia ([place])    Weakness of lower extremity    left knee, uses walker    Past Surgical History:  Procedure Laterality Date    [place]   had broken right ankle/had 3 surgeries in 1996/ has pins and rods   BOWEL DECOMPRESSION N/A 05/07/2021   Procedure: BOWEL DECOMPRESSION;  Surgeon: Irving Copas., MD;  Location: [facility];  Service: Gastroenterology;  Laterality: N/A;   BOWEL DECOMPRESSION N/A 05/06/2021   Procedure: BOWEL DECOMPRESSION;  Surgeon: Milus Banister, MD;  Location: [facility];  Service: Endoscopy;  Laterality: N/A;   COLONOSCOPY WITH PROPOFOL N/A 05/06/2021   Procedure: COLONOSCOPY WITH PROPOFOL;  Surgeon: Milus Banister, MD;  Location: [facility];  Service: Endoscopy;  Laterality: N/A;   FLEXIBLE SIGMOIDOSCOPY N/A 05/07/2021   Procedure: FLEXIBLE SIGMOIDOSCOPY;  Surgeon: Rush Landmark Telford Nab., MD;  Location: [facility];  Service: Gastroenterology;  Laterality: N/A;   LAPAROTOMY N/A 05/08/2021   Procedure: EXPLORATORY LAPAROTOMY, SIGMOID COLECTOMY, COLOSTOMY;  Surgeon: Jovita Kussmaul, MD;  Location: [facility];  Service: General;  Laterality: N/A;   LYSIS OF ADHESION N/A 09/23/2021   Procedure: ROBOTIC LYSIS OF ADHESIONS;  Surgeon: Michael Boston, MD;  Location: [facility];  Service: General;  Laterality: N/A;   PROCTOSCOPY N/A 09/23/2021   Procedure: RIGID PROCTOSCOPY;  Surgeon: Michael Boston, MD;  Location: [facility];  Service: General;  Laterality: N/A;   XI ROBOTIC ASSISTED COLOSTOMY TAKEDOWN N/A 09/23/2021   Procedure: XI ROBOTIC ASSISTED COLOSTOMY TAKEDOWN WITH BILATERAL TAP BLOCK;  Surgeon: Michael Boston,  MD;  Location: [facility];  Service: General;  Laterality: N/A;    Current Medications: Current Meds  Medication Sig   acetaminophen (TYLENOL) 650 MG CR tablet Take 650 mg by mouth every 8 (eight) hours as needed for pain.   aspirin EC 81 MG tablet Take 81 mg by mouth daily.   buPROPion (WELLBUTRIN SR) 150 MG 12 hr tablet Take 150 mg by mouth daily.   Cholecalciferol (VITAMIN D3) 25 MCG (1000 UT) CAPS Take 2,000 Units by mouth daily.   Cod Liver Oil 1000 MG CAPS Take 1,000 mg by  mouth daily.   finasteride (PROSCAR) 5 MG tablet Take 5 mg by mouth daily.   fluticasone (FLONASE) 50 MCG/ACT nasal spray Place 1 spray into both nostrils daily as needed for allergies or rhinitis.   losartan (COZAAR) 50 MG tablet Take 50 mg by mouth daily.   metoprolol succinate (TOPROL XL) 25 MG 24 hr tablet Take 1 tablet (25 mg total) by mouth at bedtime.   Omega-3 Fatty Acids (FISH OIL) 1000 MG CAPS Take 1,000 mg by mouth daily.   omeprazole (PRILOSEC) 20 MG capsule Take 20 mg by mouth daily.   oxybutynin (DITROPAN-XL) 5 MG 24 hr tablet Take 5 mg by mouth daily.   rosuvastatin (CRESTOR) 5 MG tablet Take 5 mg by mouth at bedtime.   tamsulosin (FLOMAX) 0.4 MG CAPS capsule Take 0.4 mg by mouth daily.   Travoprost, BAK Free, (TRAVATAN) 0.004 % SOLN ophthalmic solution Place 1 drop into both eyes at bedtime.   trifluoperazine (STELAZINE) 5 MG tablet Take 5 mg by mouth at bedtime.     Allergies:   Patient has no known allergies.   Social History   Socioeconomic History   Marital status: Single    Spouse name: Not on file   Number of children: Not on file   Years of education: Not on file   Highest education level: Not on file  Occupational History   Not on file  Tobacco Use   Smoking status: Never   Smokeless tobacco: Never  Vaping Use   Vaping Use: Never used  Substance and Sexual Activity   Alcohol use: No   Drug use: No   Sexual activity: Not on file  Other Topics Concern   Not on file  Social History Narrative   Not on file   Social Determinants of Health   Financial Resource Strain: Not on file  Food Insecurity: Not on file  Transportation Needs: Not on file  Physical Activity: Not on file  Stress: Not on file  Social Connections: Not on file     Family History: The patient's family history includes Breast cancer in his father and mother; Kidney disease in his mother; Leukemia in his father.  ROS:   Please see the history of present illness.    Review of  Systems  Constitutional:  Negative for chills, fever and malaise/fatigue.  HENT:  Negative for sore throat.   Eyes:  Negative for blurred vision.  Respiratory:  Positive for shortness of breath.   Cardiovascular:  Negative for chest pain, palpitations, orthopnea, leg swelling and PND.  Gastrointestinal:  Negative for abdominal pain, blood in stool, nausea and vomiting.  Genitourinary:  Negative for hematuria.  Musculoskeletal:  Positive for joint pain.  Neurological:  Negative for loss of consciousness and weakness.  Endo/Heme/Allergies:  Negative for polydipsia.  Psychiatric/Behavioral:  Negative for depression.     EKGs/Labs/Other Studies Reviewed:    The following studies were reviewed today:  CTA PE protocol 01/2022: FINDINGS: Cardiovascular: Heart is enlarged in size. There is homogeneous enhancement in thoracic aorta. There are no intraluminal filling defects in central pulmonary artery branches. Evaluation of small peripheral pulmonary artery branches is limited by motion artifacts and less than optimal contrast enhancement.   Mediastinum/Nodes: No significant lymphadenopathy seen.   Lungs/Pleura: There is no focal pulmonary consolidation. There is no pleural effusion or pneumothorax.   Upper Abdomen: Small hiatal hernia is seen. There is fluid in the lumen of thoracic esophagus.   Musculoskeletal: There is levoscoliosis in the lower thoracic spine and mild dextroscoliosis in the upper thoracic spine. Degenerative changes are noted in the visualized lower cervical spine.   Review of the MIP images confirms the above findings.   IMPRESSION: There is no evidence of central pulmonary artery embolism. There is no evidence of thoracic aortic dissection. There is no focal pulmonary consolidation.   There is fluid in the lumen of thoracic esophagus suggesting gastroesophageal reflux.   Myoview 10/07/20 EF 59, no ischemia or infarction, low risk    Echocardiogram   10/07/20 EF 60-65, no RWMA, Gr 1 DD, GLS -18.0%, normal RVSF, trivial MR    EKG:  EKG is personally reviewed. 08/15/2022:  EKG was not ordered. 02/03/2022: Sinus tachycardia. Rate 120 bpm. Right axis deviation. 08/11/2020: Sinus tachycardia with HR 113, borderline RAD, LVH  Recent Labs: 09/26/2021: BUN 14; Magnesium 1.9; Potassium 3.8; Sodium 136 09/27/2021: Hemoglobin 9.1; Platelets 155 02/07/2022: Creatinine, Ser 1.40   Recent Lipid Panel No results found for: "CHOL", "TRIG", "HDL", "CHOLHDL", "VLDL", "LDLCALC", "LDLDIRECT"    Physical Exam:    VS:  BP 122/60   Pulse 82   Ht 6\' 2"  (1.88 m)   Wt 205 lb 3.2 oz (93.1 kg)   SpO2 98%   BMI 26.35 kg/m     Wt Readings from Last 3 Encounters:  08/15/22 205 lb 3.2 oz (93.1 kg)  02/03/22 197 lb 12.8 oz (89.7 kg)  09/27/21 191 lb 12.8 oz (87 kg)     GEN: Comfortable, NAD HEENT: Normal NECK: No JVD; No carotid bruits CARDIAC: Tachycardic, regular, no murmurs RESPIRATORY:  Clear to auscultation without rales, wheezing or rhonchi  ABDOMEN: Soft, non-tender, non-distended MUSCULOSKELETAL:  Warm, trace-1+ pitting edema to mid-shin  SKIN: Warm and dry NEUROLOGIC:  Alert and oriented x 3 PSYCHIATRIC:  Normal affect   ASSESSMENT:    1. Primary hypertension   2. Shortness of breath   3. Chronic venous insufficiency   4. Stage 3a chronic kidney disease (HCC)   5. Tachycardia      PLAN:    In order of problems listed above:  #Dyspnea on Exertion: Normal myoview and TTE with EF 60-65, no RWMA, Gr 1 DD, GLS -18.0%, normal RVSF, trivial MR. CTA PE protocol 01/2022 negative for PE.  #Sinus Tachycardia: Has had elevated HR in the office since 2021. CT PE protocol negative.  -Continue metoprolol 25mg  XL qHS  #LE edema: #CKD IIIA: Not on diuretics.  -Follow-up with renal -Continue compression socks  #Hypertension: Managed by PCP. Well controlled today -Continue losartan 50mg  daily -Continue metop 25mg  XL daily as above  given persistent sinus tach  #HLD: Managed by PCP -Continue atorvastatin 20mg  daily  Follow-up:  6 months.  Medication Adjustments/Labs and Tests Ordered: Current medicines are reviewed at length with the patient today.  Concerns regarding medicines are outlined above.   No orders of the defined types were placed in this encounter.  No orders of the defined types  were placed in this encounter.  Patient Instructions  Medication Instructions:   Your physician recommends that you continue on your current medications as directed. Please refer to the Current Medication list given to you today.  *If you need a refill on your cardiac medications before your next appointment, please call your pharmacy*    Follow-Up: At [facility], you and your health needs are our priority.  As part of our continuing mission to provide you with exceptional heart care, we have created designated Provider Care Teams.  These Care Teams include your primary Cardiologist (physician) and Advanced Practice Providers (APPs -  Physician Assistants and Nurse Practitioners) who all work together to provide you with the care you need, when you need it.  We recommend signing up for the patient portal called "MyChart".  Sign up information is provided on this After Visit Summary.  MyChart is used to connect with patients for Virtual Visits (Telemedicine).  Patients are able to view lab/test results, encounter notes, upcoming appointments, etc.  Non-urgent messages can be sent to your provider as well.   To learn more about what you can do with MyChart, go to [email or web address].    Your next appointment:   6 month(s)  The format for your next appointment:   In Person  Provider:   Chelsea Aus, PA-C, Jari Favre, PA-C, Ronie Spies, PA-C, Robin Searing, NP, Nada Boozer, NP, Jacolyn Reedy, PA-C, Eligha Bridegroom, NP, or Tereso Newcomer, PA-C          Important Information About Sugar        I,Mathew  Stumpf,acting as a scribe for Meriam Sprague, MD.,have documented all relevant documentation on the behalf of Meriam Sprague, MD,as directed by  Meriam Sprague, MD while in the presence of Meriam Sprague, MD.  I, Meriam Sprague, MD, have reviewed all documentation for this visit. The documentation on 08/15/22 for the exam, diagnosis, procedures, and orders are all accurate and complete.    Signed, Meriam Sprague, MD  08/15/2022 5:31 PM    [facility]

## 2022-08-15 NOTE — Patient Instructions (Signed)
Medication Instructions:   Your physician recommends that you continue on your current medications as directed. Please refer to the Current Medication list given to you today.  *If you need a refill on your cardiac medications before your next appointment, please call your pharmacy*    Follow-Up: At [facility], you and your health needs are our priority.  As part of our continuing mission to provide you with exceptional heart care, we have created designated Provider Care Teams.  These Care Teams include your primary Cardiologist (physician) and Advanced Practice Providers (APPs -  Physician Assistants and Nurse Practitioners) who all work together to provide you with the care you need, when you need it.  We recommend signing up for the patient portal called "MyChart".  Sign up information is provided on this After Visit Summary.  MyChart is used to connect with patients for Virtual Visits (Telemedicine).  Patients are able to view lab/test results, encounter notes, upcoming appointments, etc.  Non-urgent messages can be sent to your provider as well.   To learn more about what you can do with MyChart, go to [email or web address].    Your next appointment:   6 month(s)  The format for your next appointment:   In Person  Provider:   Vin Bhagat, PA-C, Tessa Conte, PA-C, Dayna Dunn, PA-C, Ernest Dick, NP, Laura Ingold, NP, Michele Lenze, PA-C, Michelle Swinyer, NP, or Scott Weaver, PA-C          Important Information About Sugar       

## 2022-08-16 ENCOUNTER — Ambulatory Visit (INDEPENDENT_AMBULATORY_CARE_PROVIDER_SITE_OTHER): Payer: Medicare Other | Admitting: Podiatry

## 2022-08-16 DIAGNOSIS — M79675 Pain in left toe(s): Secondary | ICD-10-CM

## 2022-08-16 DIAGNOSIS — M79674 Pain in right toe(s): Secondary | ICD-10-CM | POA: Diagnosis not present

## 2022-08-16 DIAGNOSIS — L84 Corns and callosities: Secondary | ICD-10-CM | POA: Diagnosis not present

## 2022-08-16 DIAGNOSIS — I739 Peripheral vascular disease, unspecified: Secondary | ICD-10-CM | POA: Diagnosis not present

## 2022-08-16 DIAGNOSIS — B351 Tinea unguium: Secondary | ICD-10-CM

## 2022-08-16 NOTE — Progress Notes (Signed)
  Subjective:  Patient ID: Brandon [name], male    DOB: 1946-03-05,  MRN: [id number]  Brandon [name] presents to clinic today for for at risk foot care. Patient has h/o PAD, painful elongated mycotic toenails 1-5 bilaterally which are tender when wearing enclosed shoe gear. Pain is relieved with periodic professional debridement., and callus(es) right lower extremity and painful thick toenails that are difficult to trim. Painful toenails interfere with ambulation. Aggravating factors include wearing enclosed shoe gear. Pain is relieved with periodic professional debridement. Painful calluses are aggravated when weightbearing with and without shoegear. Pain is relieved with periodic professional debridement.  Chief Complaint  Patient presents with   Nail Problem    Nail Trim Not Diabetic PCP- Dr Manuella Ghazi  . New problem(s): None.   PCP is [name], MD.  No Known Allergies  Review of Systems: Negative except as noted in the HPI.  Objective: No changes noted in today's physical examination.  Brandon [name] is a pleasant 76 y.o. male obese in NAD. AAO x 3.  Neurovascular Examination: CFT <4 seconds b/l LE. Diminished DP/PT pulses b/l. Pedal hair absent b/l. Skin temperature gradient warm to cool b/l. No pain with calf compression b/l. No cyanosis or clubbing noted b/l LE. Varicosities present b/l. Trace edema noted b/l LE.  Protective sensation intact 5/5 intact bilaterally with 10g monofilament b/l. Vibratory sensation intact b/l.  Dermatological:  No open wounds b/l lower extremities. No interdigital macerations noted b/l LE. Toenails 1-5 b/l elongated, discolored, dystrophic, thickened, crumbly with subungual debris and tenderness to dorsal palpation.   Hyperkeratotic lesion(s) bilateral great toes and distal tip of right 3rd digit.  No erythema, no edema, no drainage, no fluctuance.  Musculoskeletal:  Muscle strength 4/5 to all lower extremity muscle groups bilaterally.  Hammertoe deformity noted 1-5 b/l. Pes planovalugs  deformity noted bilateral LE. Genu valgum deformity noted b/l. Utilizes walker for ambulation assistance.  Assessment/Plan: 1. Pain due to onychomycosis of toenails of both feet   2. Corns and callosities   3. PAD (peripheral artery disease) (HCC)     No orders of the defined types were placed in this encounter.   -Consent given for treatment as described below: -Examined patient. -Toenails 1-5 b/l were debrided in length and girth with sterile nail nippers and dremel without iatrogenic bleeding.  -Corn(s) distal tip of right 3rd toe pared utilizing sterile scalpel blade without complication or incident. Total number debrided=1. -Callus(es) bilateral great toes pared utilizing rotary bur without complication or incident. Total number pared =2. -Patient/POA to call should there be question/concern in the interim.   Return in about 3 months (around 11/16/2022).  Marzetta Board, DPM

## 2022-08-21 ENCOUNTER — Encounter: Payer: Self-pay | Admitting: Podiatry

## 2022-10-30 IMAGING — CT CT ABD-PELV W/O CM
2 of 4 series · 16 of 46 positions shown, 18 images · non-contrast
Comparison: None.

CLINICAL DATA: Bowel obstruction

EXAM:
CT ABDOMEN AND PELVIS WITHOUT CONTRAST
TECHNIQUE: Multidetector CT imaging of the abdomen and pelvis was performed
following the standard protocol without IV contrast.

[Series 2: abd pel wo · axial · 0.85mm/px · z∈[+817,+1247]mm · 13 of 96 slices shown, 15 images]
[im 5/96  soft-tissue]
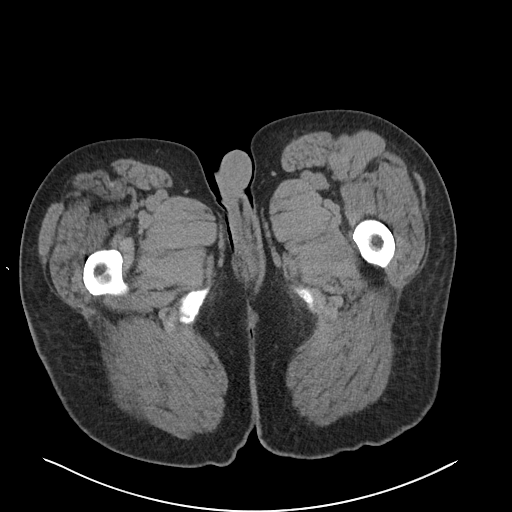
[im 5/96  bone]
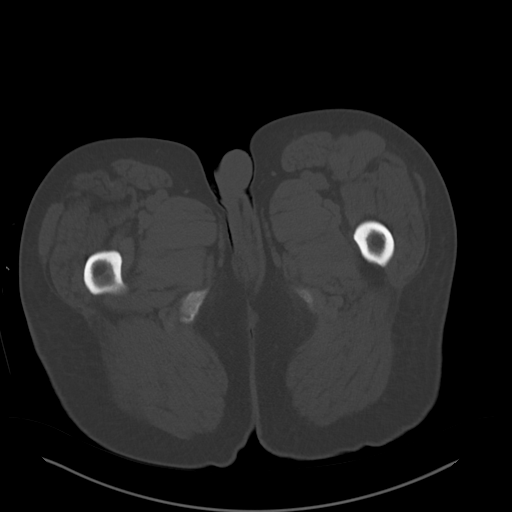
[im 13/96  soft-tissue]
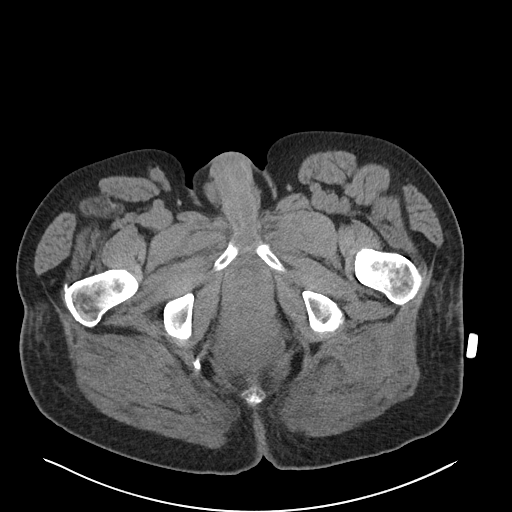
[im 21/96  soft-tissue]
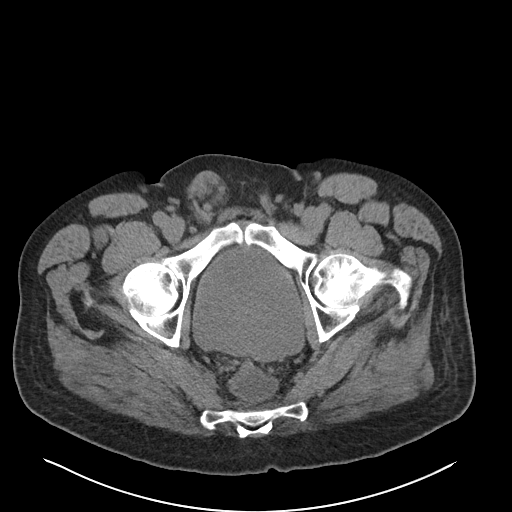
[im 25/96  soft-tissue]
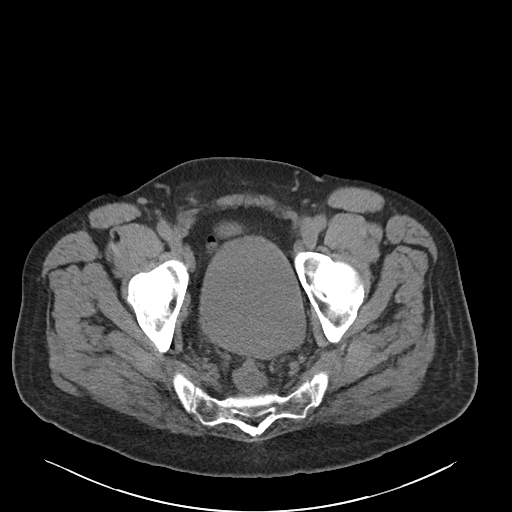
[im 34/96  soft-tissue]
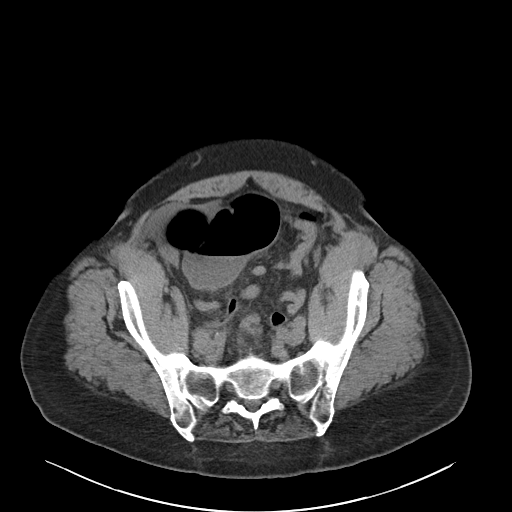
[im 42/96  soft-tissue]
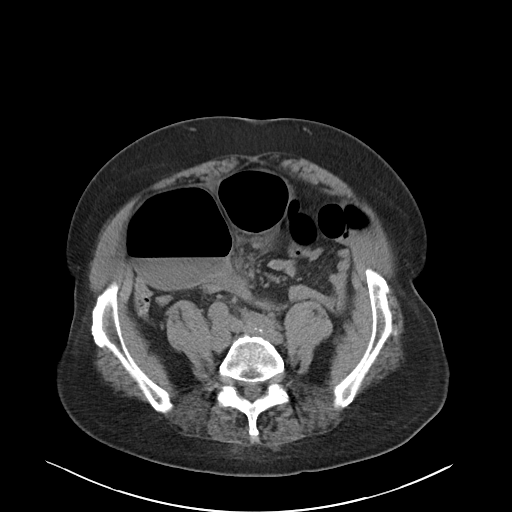
[im 50/96  soft-tissue]
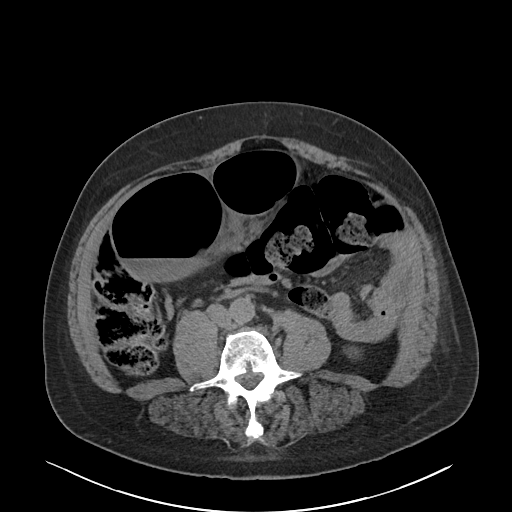
[im 54/96  soft-tissue]
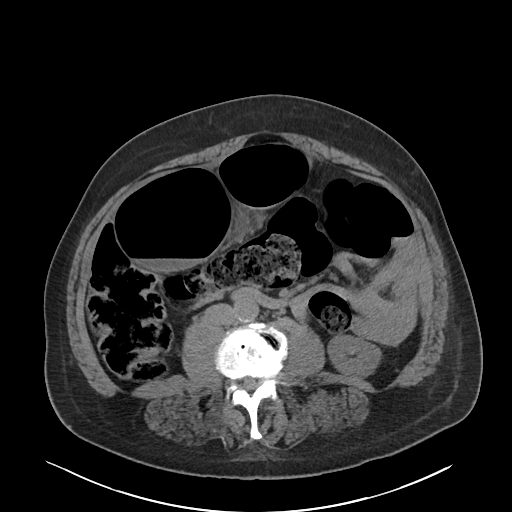
[im 62/96  soft-tissue]
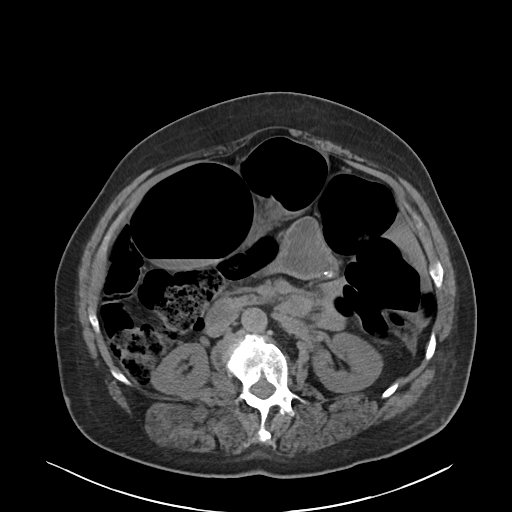
[im 62/96  bone]
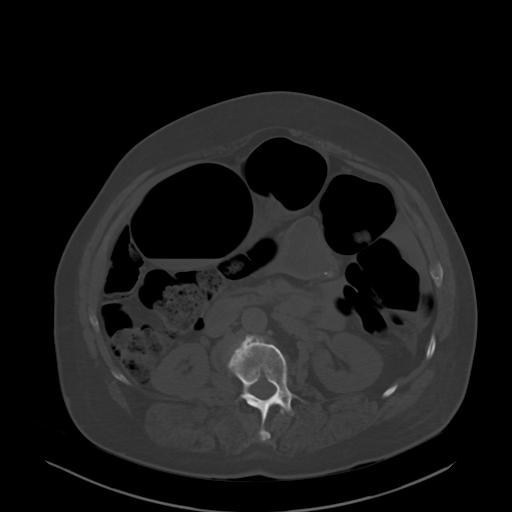
[im 71/96  soft-tissue]
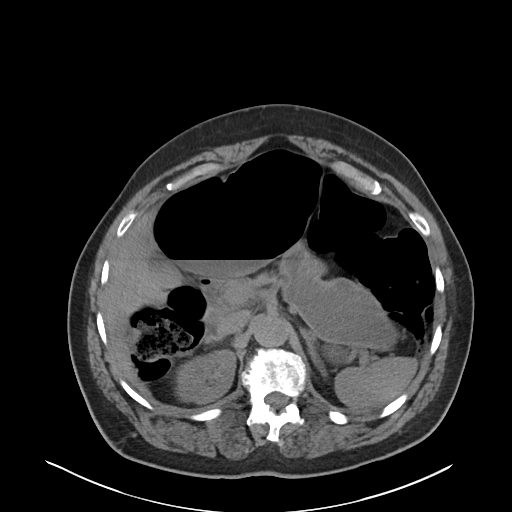
[im 75/96  soft-tissue]
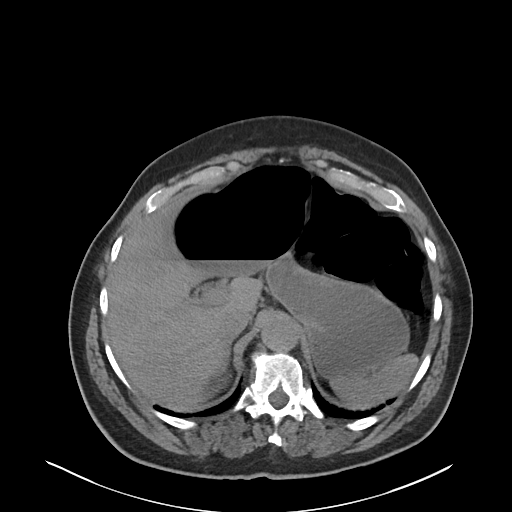
[im 83/96  soft-tissue]
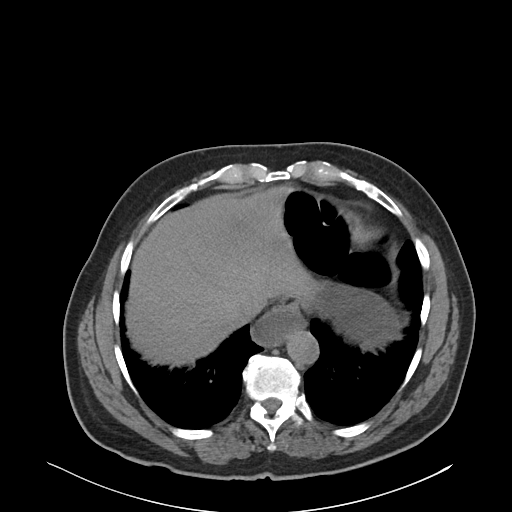
[im 91/96  soft-tissue]
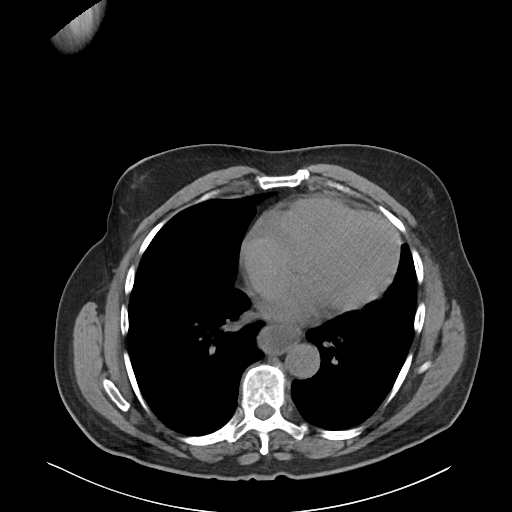

[Series 5: coronal · coronal · 0.79mm/px · 3 of 112 slices shown]
[im 38/112  soft-tissue]
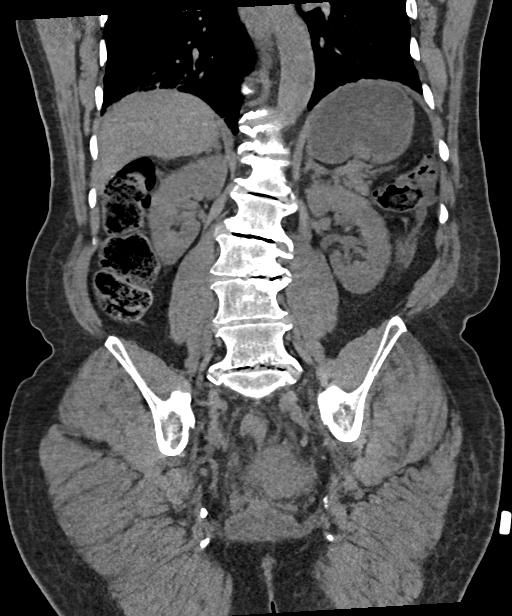
[im 50/112  soft-tissue]
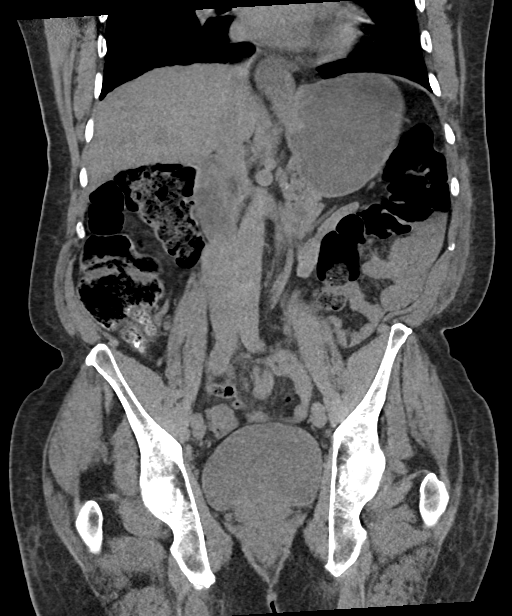
[im 62/112  soft-tissue]
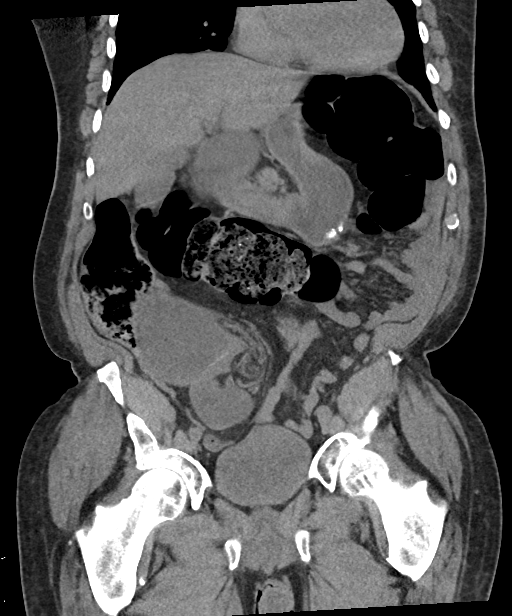

[16 of 46 positions shown; findings below may reference images not displayed]

FINDINGS: Lower chest: The visualized lung bases are clear. Small hiatal
hernia present. Fluid is seen within a distended distal esophagus
likely reflecting changes of gastroesophageal reflux. Cardiac size
within normal limits. No pericardial effusion.

Hepatobiliary: No focal liver abnormality is seen. No gallstones,
gallbladder wall thickening, or biliary dilatation.

Pancreas: Unremarkable

Spleen: Unremarkable

Adrenals/Urinary Tract: Adrenal glands are unremarkable. Kidneys are
normal, without renal calculi, focal lesion, or hydronephrosis.
Bladder is unremarkable.

Stomach/Bowel: There is sigmoid volvulus identified with distension
of the involved colon up to 9 cm in greatest dimension. The
mesenteric twist is best visualized within the mid abdomen axial
image # 54/2. There is moderate mesenteric edema involving the
affected redundant sigmoid colon. There is trace ascites present. No
free intraperitoneal gas. No pneumatosis identified.

The stomach and small bowel are unremarkable. Moderate stool seen
within the more proximal colon. The appendix is unremarkable.

Vascular/Lymphatic: Mild aortoiliac atherosclerotic calcification.
No aortic aneurysm. No pathologic adenopathy within the abdomen and
pelvis.

Reproductive: Prostate is unremarkable. Small right varicocele
noted. The right testicle appears located within the right inguinal
canal.

Other: Tiny fat containing umbilical hernia

Musculoskeletal: Degenerative changes are seen within the lumbar
spine. No lytic or blastic bone lesion identified.
IMPRESSION: Sigmoid volvulus. Moderate distension of the affected sigmoid colon,
moderate mesenteric edema involving the sigmoid mesentery, and mild
ascites present. No free intraperitoneal gas or pneumatosis to
suggest bowel infarction or perforation.

Small hiatal hernia. Fluid-filled distal esophagus in keeping with
probable gastroesophageal reflux.

Aortic Atherosclerosis ([id number].A).

## 2022-12-13 ENCOUNTER — Ambulatory Visit (INDEPENDENT_AMBULATORY_CARE_PROVIDER_SITE_OTHER): Payer: No Typology Code available for payment source | Admitting: Podiatry

## 2022-12-13 ENCOUNTER — Encounter: Payer: Self-pay | Admitting: Podiatry

## 2022-12-13 VITALS — BP 147/83

## 2022-12-13 DIAGNOSIS — I739 Peripheral vascular disease, unspecified: Secondary | ICD-10-CM | POA: Diagnosis not present

## 2022-12-13 DIAGNOSIS — M79674 Pain in right toe(s): Secondary | ICD-10-CM

## 2022-12-13 DIAGNOSIS — L84 Corns and callosities: Secondary | ICD-10-CM

## 2022-12-13 DIAGNOSIS — B351 Tinea unguium: Secondary | ICD-10-CM

## 2022-12-13 DIAGNOSIS — M79675 Pain in left toe(s): Secondary | ICD-10-CM

## 2022-12-13 NOTE — Progress Notes (Unsigned)
  Subjective:  Patient ID: Brandon [name], male    DOB: 1946/07/15,  MRN: [id number]  Brandon [name] presents to clinic today for {jgcomplaint:23593}  Chief Complaint  Patient presents with   Nail Problem    RFC PCP-Shah PCP VST-10/2022   New problem(s): None. {jgcomplaint:23593}  PCP is [name], MD.  No Known Allergies  Review of Systems: Negative except as noted in the HPI.  Objective: No changes noted in today's physical examination. Vitals:   12/13/22 1601  BP: (!) 147/83   Brandon [name] is a pleasant 77 y.o. male obese in NAD. AAO x 3.  Neurovascular Examination: CFT <4 seconds b/l LE. Diminished DP/PT pulses b/l. Pedal hair absent b/l. Skin temperature gradient warm to cool b/l. No pain with calf compression b/l. No cyanosis or clubbing noted b/l LE. Varicosities present b/l. Trace edema noted b/l LE.  Protective sensation intact 5/5 intact bilaterally with 10g monofilament b/l. Vibratory sensation intact b/l.  Dermatological:  No open wounds b/l lower extremities. No interdigital macerations noted b/l LE. Toenails 1-5 b/l elongated, discolored, dystrophic, thickened, crumbly with subungual debris and tenderness to dorsal palpation.   Hyperkeratotic lesion(s) bilateral great toes and distal tip of right 3rd digit.  No erythema, no edema, no drainage, no fluctuance.  Musculoskeletal:  Muscle strength 4/5 to all lower extremity muscle groups bilaterally. Hammertoe deformity noted 1-5 b/l. Pes planovalugs  deformity noted bilateral LE. Genu valgum deformity noted b/l. Utilizes walker for ambulation assistance.  Assessment/Plan: No diagnosis found.  No orders of the defined types were placed in this encounter.   None {Jgplan:23602::"-Patient/POA to call should there be question/concern in the interim."}   Return in about 3 months (around 03/13/2023).  Marzetta Board, DPM

## 2022-12-28 ENCOUNTER — Encounter: Payer: Self-pay | Admitting: Gastroenterology

## 2023-01-23 ENCOUNTER — Other Ambulatory Visit: Payer: Self-pay | Admitting: *Deleted

## 2023-01-23 MED ORDER — METOPROLOL SUCCINATE ER 25 MG PO TB24: 25.0000 mg | ORAL_TABLET | Freq: Every day | ORAL | 0 refills | Status: DC

## 2023-03-28 ENCOUNTER — Ambulatory Visit: Payer: No Typology Code available for payment source | Admitting: Gastroenterology

## 2023-03-28 ENCOUNTER — Encounter: Payer: Self-pay | Admitting: Gastroenterology

## 2023-03-28 VITALS — BP 130/76 | HR 62 | Ht 74.0 in | Wt 199.8 lb

## 2023-03-28 DIAGNOSIS — K5909 Other constipation: Secondary | ICD-10-CM

## 2023-03-28 DIAGNOSIS — Z9889 Other specified postprocedural states: Secondary | ICD-10-CM

## 2023-03-28 DIAGNOSIS — K219 Gastro-esophageal reflux disease without esophagitis: Secondary | ICD-10-CM | POA: Diagnosis not present

## 2023-03-28 DIAGNOSIS — Z1211 Encounter for screening for malignant neoplasm of colon: Secondary | ICD-10-CM | POA: Diagnosis not present

## 2023-03-28 MED ORDER — NA SULFATE-K SULFATE-MG SULF 17.5-3.13-1.6 GM/177ML PO SOLN: 1.0000 | ORAL | 0 refills | Status: DC

## 2023-03-28 NOTE — Patient Instructions (Addendum)
Please purchase the following medications over the counter and take as directed:  Miralax - dissolve 1 capful in at least 8 ounces of water daily for 1 week before colonoscopy.   Dulcolax 10 mg - Take 1 pill by mouth every other day ( start 1 week before colonoscopy).     We have sent the following medications to your pharmacy for you to pick up at your convenience: Suprep    You have been scheduled for a colonoscopy. Please follow written instructions given to you at your visit today.  Please pick up your prep supplies at the pharmacy within the next 1-3 days. If you use inhalers (even only as needed), please bring them with you on the day of your procedure.  _______________________________________________________  If your blood pressure at your visit was 140/90 or greater, please contact your primary care physician to follow up on this.  _______________________________________________________  If you are age 75 or older, your body mass index should be between 23-30. Your Body mass index is 26.35 kg/m. If this is out of the aforementioned range listed, please consider follow up with your Primary Care Provider.  If you are age 75 or younger, your body mass index should be between 19-25. Your Body mass index is 26.35 kg/m. If this is out of the aformentioned range listed, please consider follow up with your Primary Care Provider.   ________________________________________________________  The [facility] providers would like to encourage you to use [facility] to communicate with providers for non-urgent requests or questions.  Due to long hold times on the telephone, sending your provider a message by [facility] may be a faster and more efficient way to get a response.  Please allow 48 business hours for a response.  Please remember that this is for non-urgent requests.  _______________________________________________________  Due to recent changes in healthcare laws, you may see the results  of your imaging and laboratory studies on MyChart before your provider has had a chance to review them.  We understand that in some cases there may be results that are confusing or concerning to you. Not all laboratory results come back in the same time frame and the provider may be waiting for multiple results in order to interpret others.  Please give [place] 48 hours in order for your provider to thoroughly review all the results before contacting the office for clarification of your results.   Thank you for choosing me and [facility].  Dr. Meridee Score

## 2023-03-28 NOTE — Progress Notes (Signed)
GASTROENTEROLOGY OUTPATIENT CLINIC VISIT   Primary Care Provider Ellyn Hack, MD [address] [place] [address] [phone]   Patient Profile: Brandon [name] is a 77 y.o. 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  The patient presents to the [facility] for an evaluation and management of problem(s) noted below:  Problem List 1. Colon cancer screening   2. History of colostomy reversal   3. Chronic constipation   4. Gastroesophageal reflux disease, unspecified whether esophagitis present     History of Present Illness Please see prior GI notes for full details of HPI.  Interval History Patient returns for follow-up with his sisters.  He is doing well.  He still deals with constipation but uses the bathroom on a daily basis per his report.  He does not have to use laxative therapy regularly.  No blood in his stools.  He was to see Dr. Christella Hartigan in follow-up to consider repeat colonoscopy since his preparation was fair and could have had polyps that may have been missed.  He has not had issues since his ostomy reversal.  He and his family still think that he is healthy enough to undergo colonoscopy.  He does have GERD symptoms but they have been controlled on his current PPI therapy.  GI Review of Systems Positive as above Negative for dysphagia, odynophagia, nausea, vomiting, melena, hematochezia  Review of Systems General: Denies fevers/chills/weight loss unintentionally Cardiovascular: Denies chest pain Pulmonary: Denies shortness of breath Gastroenterological: See HPI Genitourinary: Denies darkened urine Hematological: Denies easy bruising/bleeding Endocrine: Denies temperature intolerance Dermatological: Denies jaundice Psychological: Mood is stable   Medications Current  Outpatient Medications  Medication Sig Dispense Refill   acetaminophen (TYLENOL) 650 MG CR tablet Take 650 mg by mouth every 8 (eight) hours as needed for pain.     aspirin EC 81 MG tablet Take 81 mg by mouth daily.     buPROPion (WELLBUTRIN SR) 150 MG 12 hr tablet Take 150 mg by mouth daily.     Cholecalciferol (VITAMIN D3) 25 MCG (1000 UT) CAPS Take 2,000 Units by mouth daily.     Cod Liver Oil 1000 MG CAPS Take 1,000 mg by mouth daily.     finasteride (PROSCAR) 5 MG tablet Take 5 mg by mouth daily.     fluticasone (FLONASE) 50 MCG/ACT nasal spray Place 1 spray into both nostrils daily as needed for allergies or rhinitis.     losartan (COZAAR) 50 MG tablet Take 50 mg by mouth daily.     metoprolol succinate (TOPROL XL) 25 MG 24 hr tablet Take 1 tablet (25 mg total) by mouth at bedtime. 90 tablet 0   Na Sulfate-K Sulfate-Mg Sulf (SUPREP BOWEL PREP KIT) 17.5-3.13-1.6 GM/177ML SOLN Take 1 kit by mouth as directed. For colonoscopy prep 354 mL 0   Omega-3 Fatty Acids (FISH OIL) 1000 MG CAPS Take 1,000 mg by mouth daily.     omeprazole (PRILOSEC) 20 MG capsule Take 20 mg by mouth daily.     oxybutynin (DITROPAN-XL) 5 MG 24 hr tablet Take 5 mg by mouth daily.     rosuvastatin (CRESTOR) 5 MG tablet Take 5 mg by mouth at bedtime.     tamsulosin (FLOMAX) 0.4 MG CAPS capsule Take 0.4 mg by mouth daily.     Travoprost, BAK Free, (TRAVATAN) 0.004 % SOLN ophthalmic solution Place 1 drop into both  eyes at bedtime.     trifluoperazine (STELAZINE) 5 MG tablet Take 5 mg by mouth at bedtime.     No current facility-administered medications for this visit.    Allergies No Known Allergies  Histories Past Medical History:  Diagnosis Date   Allergy    Arthritis    Bilateral knee swelling    Glaucoma    Hypertension    Prostate disorder    Schizophrenia (HCC)    Weakness of lower extremity    left knee, uses walker   Past Surgical History:  Procedure Laterality Date   ANKLE SURGERY  1997   had  broken right ankle/had 3 surgeries in 1996/ has pins and rods   BOWEL DECOMPRESSION N/A 05/07/2021   Procedure: BOWEL DECOMPRESSION;  Surgeon: Lemar Lofty., MD;  Location: [facility];  Service: Gastroenterology;  Laterality: N/A;   BOWEL DECOMPRESSION N/A 05/06/2021   Procedure: BOWEL DECOMPRESSION;  Surgeon: Rachael Fee, MD;  Location: [facility];  Service: Endoscopy;  Laterality: N/A;   COLONOSCOPY WITH PROPOFOL N/A 05/06/2021   Procedure: COLONOSCOPY WITH PROPOFOL;  Surgeon: Rachael Fee, MD;  Location: [facility];  Service: Endoscopy;  Laterality: N/A;   FLEXIBLE SIGMOIDOSCOPY N/A 05/07/2021   Procedure: FLEXIBLE SIGMOIDOSCOPY;  Surgeon: Meridee Score Netty Starring., MD;  Location: [facility];  Service: Gastroenterology;  Laterality: N/A;   LAPAROTOMY N/A 05/08/2021   Procedure: EXPLORATORY LAPAROTOMY, SIGMOID COLECTOMY, COLOSTOMY;  Surgeon: Griselda Miner, MD;  Location: [facility];  Service: General;  Laterality: N/A;   LYSIS OF ADHESION N/A 09/23/2021   Procedure: ROBOTIC LYSIS OF ADHESIONS;  Surgeon: Karie Soda, MD;  Location: [facility];  Service: General;  Laterality: N/A;   PROCTOSCOPY N/A 09/23/2021   Procedure: RIGID PROCTOSCOPY;  Surgeon: Karie Soda, MD;  Location: [facility];  Service: General;  Laterality: N/A;   XI ROBOTIC ASSISTED COLOSTOMY TAKEDOWN N/A 09/23/2021   Procedure: XI ROBOTIC ASSISTED COLOSTOMY TAKEDOWN WITH BILATERAL TAP BLOCK;  Surgeon: Karie Soda, MD;  Location: [facility];  Service: General;  Laterality: N/A;   Social History   Socioeconomic History   Marital status: Single    Spouse name: Not on file   Number of children: Not on file   Years of education: Not on file   Highest education level: Not on file  Occupational History   Not on file  Tobacco Use   Smoking status: Never   Smokeless tobacco: Never  Vaping Use   Vaping Use: Never used  Substance and Sexual Activity   Alcohol use: No   Drug use: No   Sexual activity: Not on file   Other Topics Concern   Not on file  Social History Narrative   Not on file   Social Determinants of Health   Financial Resource Strain: Not on file  Food Insecurity: Not on file  Transportation Needs: Not on file  Physical Activity: Not on file  Stress: Not on file  Social Connections: Not on file  Intimate Partner Violence: Not on file   Family History  Problem Relation Age of Onset   Kidney disease Mother    Breast cancer Mother    Breast cancer Father    Leukemia Father    I have reviewed his medical, social, and family history in detail and updated the electronic medical record as necessary.    PHYSICAL EXAMINATION  BP 130/76   Pulse 62   Ht 6\' 2"  (1.88 m)   Wt 199 lb 12.8 oz (90.6 kg) Comment: unable to  obtain  BMI 25.65 kg/m  Wt Readings from Last 3 Encounters:  03/28/23 199 lb 12.8 oz (90.6 kg)  08/15/22 205 lb 3.2 oz (93.1 kg)  02/03/22 197 lb 12.8 oz (89.7 kg)  GEN: NAD, appears chronically ill but is nontoxic, in a wheelchair, 2 sisters with him  PSYCH: Cooperative, without pressured speech EYE: Conjunctivae pink, sclerae anicteric ENT: MMM CV: Nontachycardic RESP: No audible wheezing GI: NABS, soft, NT/ND, without rebound or guarding, surgical scars present MSK/EXT: Bilateral pedal edema SKIN: No jaundice NEURO:  Alert & Oriented x 3, no focal deficits   REVIEW OF DATA  I reviewed the following data at the time of this encounter:  GI Procedures and Studies  December 2022 colonoscopy - Preparation of the colon was fair even after extensive lavage and using 2 coloscopes due to the stool clogging the adult colonoscopes.. - Hemorrhoids found on digital rectal exam. - No gross lesions noted throughout the visualized colon, though polyps <6 mm could have been missed. - Granularity/friability at the rectosigmoid anastomosis - biopsied. - Non-bleeding non-thrombosed external and internal hemorrhoids.  Laboratory Studies  Reviewed those in epic  Imaging  Studies  No new imaging studies to review   ASSESSMENT  Mr. Kitchens is a 77 y.o. 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  The patient is seen today for evaluation and management of:  1. Colon cancer screening   2. History of colostomy reversal   3. Chronic constipation   4. Gastroesophageal reflux disease, unspecified whether esophagitis present    The patient is hemodynamically stable.  Clinically he seems to be stable as well.  His last colonoscopy only showed fair preparation and polyps could have been missed.  Now that he has had ostomy reversal, his bowel habits seem to still be okay.  We are going to offer him 1 last colonoscopy to make sure everything looks clean and that he does not have evidence of polyps.  Will plan to do this in the hospital-based setting.  Will initiate him on laxative therapy a week before and then typical bowel preparation.  Due to his longstanding GERD symptoms, he deserves at least 1 Barrett's screening, so we will perform an upper endoscopy at the same time.  The risks and benefits of endoscopic evaluation were discussed with the patient; these include but are not limited to the risk of perforation, infection, bleeding, missed lesions, lack of diagnosis, severe illness requiring hospitalization, as well as anesthesia and sedation related illnesses.  The patient and/or family is agreeable to proceed.  All patient questions were answered to the best of my ability, and the patient agrees to the aforementioned plan of action with follow-up as indicated.   PLAN  Continue current PPI therapy Proceed with scheduling screening endoscopy for Barrett's Proceed with scheduling colonoscopy for updated screening in the setting of inadequate preparation last time - 1 week of laxatives prior to preparation These  cases will be performed in the hospital-based setting   Orders Placed This Encounter  Procedures   Procedural/ Surgical Case Request: COLONOSCOPY WITH PROPOFOL   Ambulatory referral to Gastroenterology    New Prescriptions   NA SULFATE-K SULFATE-MG SULF (SUPREP BOWEL PREP KIT) 17.5-3.13-1.6 GM/177ML SOLN    Take 1 kit by mouth as directed. For colonoscopy prep   Modified Medications   No medications on file    Planned Follow Up No  follow-ups on file.   Total Time in Face-to-Face and in Coordination of Care for patient including independent/personal interpretation/review of prior testing, medical history, examination, medication adjustment, communicating results with the patient directly, and documentation within the EHR is 30 minutes.   Corliss Parish, MD  Gastroenterology [facility] Office # [phone]

## 2023-04-01 DIAGNOSIS — Z9889 Other specified postprocedural states: Secondary | ICD-10-CM | POA: Insufficient documentation

## 2023-04-01 DIAGNOSIS — K5909 Other constipation: Secondary | ICD-10-CM | POA: Insufficient documentation

## 2023-04-01 DIAGNOSIS — Z1211 Encounter for screening for malignant neoplasm of colon: Secondary | ICD-10-CM | POA: Insufficient documentation

## 2023-04-01 DIAGNOSIS — K219 Gastro-esophageal reflux disease without esophagitis: Secondary | ICD-10-CM | POA: Insufficient documentation

## 2023-04-03 ENCOUNTER — Telehealth: Payer: Self-pay

## 2023-04-03 DIAGNOSIS — K219 Gastro-esophageal reflux disease without esophagitis: Secondary | ICD-10-CM

## 2023-04-03 DIAGNOSIS — K2270 Barrett's esophagus without dysplasia: Secondary | ICD-10-CM

## 2023-04-03 NOTE — Telephone Encounter (Signed)
EGD has been added to the case.  CPT code has been added.  AMB referral entered

## 2023-04-03 NOTE — Telephone Encounter (Signed)
-----   Message from Lemar Lofty., MD sent at 04/01/2023  6:19 AM EDT ----- Regarding: Add EGD on for Colonoscopy Boruch Manuele, Due to patient's GERD symptoms he needs Barrett's screening.  Add-on EGD for date of his Colonoscopy.  Thanks. GM

## 2023-04-11 ENCOUNTER — Ambulatory Visit (INDEPENDENT_AMBULATORY_CARE_PROVIDER_SITE_OTHER): Payer: No Typology Code available for payment source | Admitting: Podiatry

## 2023-04-11 ENCOUNTER — Encounter: Payer: Self-pay | Admitting: Podiatry

## 2023-04-11 VITALS — BP 151/68 | HR 82

## 2023-04-11 DIAGNOSIS — L84 Corns and callosities: Secondary | ICD-10-CM | POA: Diagnosis not present

## 2023-04-11 DIAGNOSIS — M79674 Pain in right toe(s): Secondary | ICD-10-CM | POA: Diagnosis not present

## 2023-04-11 DIAGNOSIS — M79675 Pain in left toe(s): Secondary | ICD-10-CM | POA: Diagnosis not present

## 2023-04-11 DIAGNOSIS — B353 Tinea pedis: Secondary | ICD-10-CM | POA: Diagnosis not present

## 2023-04-11 DIAGNOSIS — B351 Tinea unguium: Secondary | ICD-10-CM

## 2023-04-11 DIAGNOSIS — I739 Peripheral vascular disease, unspecified: Secondary | ICD-10-CM

## 2023-04-11 MED ORDER — KETOCONAZOLE 2 % EX CREA
TOPICAL_CREAM | CUTANEOUS | 2 refills | Status: DC
Start: 2023-04-11 — End: 2024-06-10

## 2023-04-11 MED ORDER — MICONAZOLE NITRATE 2 % EX AERO
INHALATION_SPRAY | CUTANEOUS | 2 refills | Status: DC
Start: 2023-04-11 — End: 2024-06-10

## 2023-04-11 NOTE — Progress Notes (Signed)
  Subjective:  Patient ID: Brandon [name], male    DOB: 1946-05-25,  MRN: [id number]  CAELIN OPPY presents to clinic today for at risk foot care. Patient has h/o PAD and corn(s) R 3rd toe, callus(es) R hallux and painful mycotic nails.  Pain interferes with ambulation. Aggravating factors include wearing enclosed shoe gear. Painful toenails interfere with ambulation. Aggravating factors include wearing enclosed shoe gear. Pain is relieved with periodic professional debridement. Painful corns and calluses are aggravated when weightbearing with and without shoegear. Pain is relieved with periodic professional debridement. Patient appears to have gotten his slippers wet and was not aware. Chief Complaint  Patient presents with   Nail Problem    "Trim my toenails and calluses."   New problem(s):    PCP is Ellyn Hack, MD.  No Known Allergies  Review of Systems: Negative except as noted in the HPI.  Objective: No changes noted in today's physical examination. Vitals:   04/11/23 1644  BP: (!) 151/68  Pulse: 82   DMARKUS MCKAUGHAN is a pleasant 77 y.o. male overweight, in NAD. AAO x 3.  Neurovascular Examination: CFT <4 seconds b/l LE. Diminished DP/PT pulses b/l. Pedal hair absent b/l. Skin temperature gradient warm to cool b/l. No pain with calf compression b/l. No cyanosis or clubbing noted b/l LE. Varicosities present b/l. Trace edema noted b/l LE.  Protective sensation intact 5/5 intact bilaterally with 10g monofilament b/l. Vibratory sensation intact b/l.  Dermatological:  No open wounds b/l lower extremities.  Maceration noted plantar aspect of right heel and left forefoot. Toenails 1-5 b/l elongated, discolored, dystrophic, thickened, crumbly with subungual debris and tenderness to dorsal palpation.   Hyperkeratotic lesion(s) bilateral great toes and distal tip of right 3rd digit.  No erythema, no edema, no drainage, no fluctuance.  Musculoskeletal:  Muscle strength 4/5  to all lower extremity muscle groups bilaterally. Hammertoe deformity noted 1-5 b/l. Pes planovalugs  deformity noted bilateral LE. Genu valgum deformity noted b/l. Utilizes walker for ambulation assistance.  Assessment/Plan: 1. Pain due to onychomycosis of toenails of both feet   2. Callus   3. Tinea pedis of both feet   4. PAD (peripheral artery disease) (HCC)     Meds ordered this encounter  Medications   ketoconazole (NIZORAL) 2 % cream    Sig: Apply to both feet and between toes once daily for 6 weeks.    Dispense:  60 g    Refill:  2   Miconazole Nitrate 2 % AERO    Sig: Spray between toes once daily.    Dispense:  150 g    Refill:  2  -Patient's family member present. All questions/concerns addressed on today's visit. -Continue supportive shoe gear daily. -Toenails 1-5 b/l were debrided in length and girth with sterile nail nippers and dremel without iatrogenic bleeding.  -Corn(s) R 3rd toe and callus(es) R hallux were pared utilizing sterile scalpel blade without incident. Total number debrided =2. -For tinea pedis, Rx sent to pharmacy for Ketoconazole Cream 2% to be applied once daily for six weeks. -Rx written for Miconazole Spray Powder 2% antifungal spray powder. Spray between toes once daily. Dry well between toes and under toes after bath/shower. -Patient/POA to call should there be question/concern in the interim.   Return in about 3 months (around 07/12/2023).  Freddie Breech, DPM

## 2023-04-11 NOTE — Patient Instructions (Signed)
Athlete's Foot Athlete's foot (tinea pedis) is a fungal infection of the skin on your feet. It often occurs on the skin that is between or underneath the toes. It can also occur on the soles of your feet. The infection can spread from person to person (is contagious). It can also spread when a person's bare feet come in contact with the fungus on shower floors or on items such as shoes. What are the causes? This condition is caused by a fungus that grows in warm, moist places. You can get athlete's foot by sharing shoes, shower stalls, towels, and wet floors with someone who is infected. Not washing your feet or changing your socks often enough can also lead to athlete's foot. What increases the risk? This condition is more likely to develop in: Men. People who have a weak body defense system (immune system). People who have diabetes. People who use public showers, such as at a gym. People who wear heavy-duty shoes, such as industrial or military shoes. Seasons with warm, humid weather. What are the signs or symptoms? Symptoms of this condition include: Itchy areas between your toes or on the soles of your feet. White, flaky, or scaly areas between your toes or on the soles of your feet. Very itchy small blisters between your toes or on the soles of your feet. Small cuts in your skin. These cuts can become infected. Thick or discolored toenails. How is this diagnosed? This condition may be diagnosed with a physical exam and a review of your medical history. Your health care provider may also take a skin or toenail sample to examine under a microscope. How is this treated? This condition is treated with antifungal medicines. These may be applied as powders, ointments, or creams. In severe cases, an oral antifungal medicine may be given. Follow these instructions at home: Medicines Apply or take over-the-counter and prescription medicines only as told by your health care provider. Apply your  antifungal medicine as told by your health care provider. Do not stop using the antifungal even if your condition improves. Foot care Do not scratch your feet. Keep your feet dry: Wear cotton or wool socks. Change your socks every day or if they become wet. Wear shoes that allow air to flow, such as sandals or canvas tennis shoes. Wash and dry your feet, including the area between your toes. Also, wash and dry your feet: Every day or as told by your health care provider. After exercising. General instructions Do not let others use towels, shoes, nail clippers, or other personal items that touch your feet. Protect your feet by wearing sandals in wet areas, such as locker rooms and shared showers. Keep all follow-up visits. This is important. If you have diabetes, keep your blood sugar under control. Contact a health care provider if: You have a fever. You have swelling, soreness, warmth, or redness in your foot. Your feet are not getting better with treatment. Your symptoms get worse. You have new symptoms. You have severe pain. Summary Athlete's foot (tinea pedis) is a fungal infection of the skin on your feet. It often occurs on skin that is between or underneath the toes. This condition is caused by a fungus that grows in warm, moist places. Symptoms include white, flaky, or scaly areas between your toes or on the soles of your feet. This condition is treated with antifungal medicines. Keep your feet clean. Always dry them thoroughly. This information is not intended to replace advice given to you by   your health care provider. Make sure you discuss any questions you have with your health care provider. Document Revised: 01/24/2021 Document Reviewed: 01/24/2021 Elsevier Patient Education  2024 [employer]  

## 2023-04-16 ENCOUNTER — Encounter: Payer: Self-pay | Admitting: Podiatry

## 2023-04-28 ENCOUNTER — Other Ambulatory Visit: Payer: Self-pay | Admitting: *Deleted

## 2023-04-28 MED ORDER — METOPROLOL SUCCINATE ER 25 MG PO TB24: 25.0000 mg | ORAL_TABLET | Freq: Every day | ORAL | 0 refills | Status: DC

## 2023-05-12 ENCOUNTER — Encounter (HOSPITAL_COMMUNITY): Payer: Self-pay | Admitting: Gastroenterology

## 2023-05-12 ENCOUNTER — Other Ambulatory Visit: Payer: Self-pay

## 2023-05-12 NOTE — Progress Notes (Signed)
Brandon [name]  Prep instructions- reviewed  Domingo Madeira MD Cardiologist- Dr Shari Prows  EKG- 02/03/22 Echo-10/07/20 Cath-n/a Stress- 10/07/20 ICD/PM-n/a Blood thinner- n/a GLP-1- n/a  Hx: HTN, Schizophrenia , chronic venous insufficiency, CKD, Tachycardia. Last saw cardiology 07/2022 with a 6 month f/u. His sister stated he hasnt had 6 month appt yet due to cardiologist leaving, hasnt found new one yet but will make appt soon. She endorsed he isn't having any new issues, denies chest pain/SOB.  Anesthesia Review: Yes

## 2023-05-18 ENCOUNTER — Telehealth: Payer: Self-pay | Admitting: Gastroenterology

## 2023-05-18 NOTE — Telephone Encounter (Signed)
Patient needing assistance with prep . Please advise.

## 2023-05-18 NOTE — Telephone Encounter (Signed)
Returned call to patient's sister. Helped her get patients my chart set-up. She was able to get into patients my chart and view prep instructions that I sent. I spent 48 mins on the phone with sister explaining prep instructions and questions related to procedure and diet. She voiced understanding and was thankful for the call.

## 2023-05-22 ENCOUNTER — Other Ambulatory Visit: Payer: Self-pay

## 2023-05-22 ENCOUNTER — Encounter (HOSPITAL_COMMUNITY)
Admission: RE | Disposition: A | Discharge status: Home / Self Care | Payer: Self-pay | Source: Ambulatory Visit | Attending: Gastroenterology

## 2023-05-22 ENCOUNTER — Ambulatory Visit (HOSPITAL_BASED_OUTPATIENT_CLINIC_OR_DEPARTMENT_OTHER): Payer: Medicare PPO | Admitting: Certified Registered"

## 2023-05-22 ENCOUNTER — Ambulatory Visit (HOSPITAL_COMMUNITY): Payer: Medicare PPO | Admitting: Certified Registered"

## 2023-05-22 ENCOUNTER — Encounter (HOSPITAL_COMMUNITY): Payer: Self-pay | Admitting: Gastroenterology

## 2023-05-22 ENCOUNTER — Ambulatory Visit (HOSPITAL_COMMUNITY)
Admission: RE | Admit: 2023-05-22 | Discharge: 2023-05-22 | Disposition: A | Discharge status: Home / Self Care | Payer: Medicare PPO | Source: Ambulatory Visit | Attending: Gastroenterology | Admitting: Gastroenterology

## 2023-05-22 DIAGNOSIS — Z79899 Other long term (current) drug therapy: Secondary | ICD-10-CM | POA: Diagnosis not present

## 2023-05-22 DIAGNOSIS — K641 Second degree hemorrhoids: Secondary | ICD-10-CM | POA: Diagnosis not present

## 2023-05-22 DIAGNOSIS — I10 Essential (primary) hypertension: Secondary | ICD-10-CM | POA: Diagnosis not present

## 2023-05-22 DIAGNOSIS — K449 Diaphragmatic hernia without obstruction or gangrene: Secondary | ICD-10-CM | POA: Diagnosis not present

## 2023-05-22 DIAGNOSIS — K3189 Other diseases of stomach and duodenum: Secondary | ICD-10-CM | POA: Diagnosis not present

## 2023-05-22 DIAGNOSIS — Z9889 Other specified postprocedural states: Secondary | ICD-10-CM

## 2023-05-22 DIAGNOSIS — Z1211 Encounter for screening for malignant neoplasm of colon: Secondary | ICD-10-CM | POA: Diagnosis not present

## 2023-05-22 DIAGNOSIS — Z9049 Acquired absence of other specified parts of digestive tract: Secondary | ICD-10-CM | POA: Insufficient documentation

## 2023-05-22 DIAGNOSIS — K644 Residual hemorrhoidal skin tags: Secondary | ICD-10-CM | POA: Insufficient documentation

## 2023-05-22 DIAGNOSIS — K209 Esophagitis, unspecified without bleeding: Secondary | ICD-10-CM | POA: Diagnosis not present

## 2023-05-22 DIAGNOSIS — K2100 Gastro-esophageal reflux disease with esophagitis, without bleeding: Secondary | ICD-10-CM | POA: Diagnosis not present

## 2023-05-22 DIAGNOSIS — N1831 Chronic kidney disease, stage 3a: Secondary | ICD-10-CM

## 2023-05-22 DIAGNOSIS — I129 Hypertensive chronic kidney disease with stage 1 through stage 4 chronic kidney disease, or unspecified chronic kidney disease: Secondary | ICD-10-CM | POA: Diagnosis not present

## 2023-05-22 DIAGNOSIS — Z13810 Encounter for screening for upper gastrointestinal disorder: Secondary | ICD-10-CM

## 2023-05-22 DIAGNOSIS — K5909 Other constipation: Secondary | ICD-10-CM

## 2023-05-22 DIAGNOSIS — K2289 Other specified disease of esophagus: Secondary | ICD-10-CM | POA: Diagnosis not present

## 2023-05-22 DIAGNOSIS — R12 Heartburn: Secondary | ICD-10-CM | POA: Diagnosis present

## 2023-05-22 HISTORY — PX: BIOPSY: SHX5522

## 2023-05-22 HISTORY — PX: COLONOSCOPY WITH PROPOFOL: SHX5780

## 2023-05-22 HISTORY — PX: ESOPHAGOGASTRODUODENOSCOPY (EGD) WITH PROPOFOL: SHX5813

## 2023-05-22 SURGERY — COLONOSCOPY WITH PROPOFOL
Anesthesia: Monitor Anesthesia Care

## 2023-05-22 MED ORDER — POLYETHYLENE GLYCOL 3350 17 G PO PACK: 17.0000 g | PACK | Freq: Every day | ORAL | Status: AC

## 2023-05-22 MED ORDER — FLEET ENEMA 7-19 GM/118ML RE ENEM
ENEMA | RECTAL | Status: AC
  Filled 2023-05-22: qty 1

## 2023-05-22 MED ORDER — OMEPRAZOLE 40 MG PO CPDR: 40.0000 mg | DELAYED_RELEASE_CAPSULE | Freq: Two times a day (BID) | ORAL | 12 refills | Status: DC

## 2023-05-22 MED ORDER — LACTATED RINGERS IV SOLN: INTRAVENOUS | Status: DC

## 2023-05-22 MED ORDER — SODIUM CHLORIDE 0.9 % IV SOLN: INTRAVENOUS | Status: DC

## 2023-05-22 MED ORDER — PROPOFOL 10 MG/ML IV BOLUS
INTRAVENOUS | Status: DC | PRN
Start: 2023-05-22 — End: 2023-05-22
  Administered 2023-05-22: 20 mg via INTRAVENOUS
  Administered 2023-05-22: 10 mg via INTRAVENOUS

## 2023-05-22 MED ORDER — PROPOFOL 500 MG/50ML IV EMUL
INTRAVENOUS | Status: DC | PRN
  Administered 2023-05-22: 125 ug/kg/min via INTRAVENOUS

## 2023-05-22 MED ORDER — LIDOCAINE 2% (20 MG/ML) 5 ML SYRINGE
INTRAMUSCULAR | Status: DC | PRN
  Administered 2023-05-22: 100 mg via INTRAVENOUS

## 2023-05-22 MED ORDER — FLEET ENEMA 7-19 GM/118ML RE ENEM
1.0000 | ENEMA | Freq: Once | RECTAL | Status: AC
  Administered 2023-05-22: 1 via RECTAL

## 2023-05-22 SURGICAL SUPPLY — 25 items

## 2023-05-22 NOTE — Op Note (Addendum)
[facility] Patient Name: Brandon [name] Procedure Date: 05/22/2023 MRN: [id number] Attending MD: Corliss Parish , MD, [id number] Date of Birth: [birth year] CSN: [id number] Age: 77 Admit Type: Outpatient Procedure:                Upper GI endoscopy Indications:              Heartburn, Screening for Barrett's esophagus in                            patient at risk for this condition Providers:                Corliss Parish, MD, Eliberto Ivory, RN, Harrington Challenger, Technician Referring MD:             Shelbie Ammons. Sherryll Burger Medicines:                Monitored Anesthesia Care Complications:            No immediate complications. Estimated Blood Loss:     Estimated blood loss was minimal. Procedure:                Pre-Anesthesia Assessment:                           - Prior to the procedure, a History and Physical                            was performed, and patient medications and                            allergies were reviewed. The patient's tolerance of                            previous anesthesia was also reviewed. The risks                            and benefits of the procedure and the sedation                            options and risks were discussed with the patient.                            All questions were answered, and informed consent                            was obtained. Prior Anticoagulants: The patient has                            taken no anticoagulant or antiplatelet agents                            except for aspirin. ASA Grade Assessment: III - A  patient with severe systemic disease. After                            reviewing the risks and benefits, the patient was                            deemed in satisfactory condition to undergo the                            procedure.                           After obtaining informed consent, the endoscope was                            passed under  direct vision. Throughout the                            procedure, the patient's blood pressure, pulse, and                            oxygen saturations were monitored continuously. The                            [id number]-[id number] ([id number]) Olympus endoscope was introduced                            through the mouth, and advanced to the second part                            of duodenum. The upper GI endoscopy was                            accomplished without difficulty. The patient                            tolerated the procedure. Scope In: Scope Out: Findings:      No gross lesions were noted in the proximal esophagus and in the mid       esophagus.      LA Grade C (one or more mucosal breaks continuous between tops of 2 or       more mucosal folds, less than 75% circumference) esophagitis with no       bleeding was found in the distal esophagus.      The Z-line was irregular and was found 35 cm from the incisors.      A 5 cm hiatal hernia was present.      Patchy mildly erythematous mucosa without bleeding was found in the       entire examined stomach. Biopsies were taken with a cold forceps for       histology and Helicobacter pylori testing.      No gross lesions were noted in the duodenal bulb, in the first portion       of the duodenum and in the second portion of the duodenum. Impression:               -  No gross lesions in the proximal esophagus and in                            the mid esophagus.                           - LA Grade C esophagitis with no bleeding found                            distally.                           - Z-line irregular, 35 cm from the incisors.                           - 5 cm hiatal hernia.                           - Erythematous mucosa in the stomach. Biopsied.                           - No gross lesions in the duodenal bulb, in the                            first portion of the duodenum and in the second                            portion  of the duodenum. Moderate Sedation:      Not Applicable - Patient had care per Anesthesia. Recommendation:           - Proceed to scheduled colonoscopy.                           - Increase PPI to 40 mg twice daily (take 30                            minutes before meal).                           - Observe patient's clinical course.                           - Await pathology results.                           - Repeat EGD in 4 months to ensure healing of                            esophagus esophagitis.                           - The findings and recommendations were discussed                            with the patient.                           -  The findings and recommendations were discussed                            with the patient's family. Procedure Code(s):        --- Professional ---                           [phone], Esophagogastroduodenoscopy, flexible,                            transoral; with biopsy, single or multiple Diagnosis Code(s):        --- Professional ---                           K20.90, Esophagitis, unspecified without bleeding                           K22.89, Other specified disease of esophagus                           K44.9, Diaphragmatic hernia without obstruction or                            gangrene                           K31.89, Other diseases of stomach and duodenum                           R12, Heartburn                           Z13.810, Encounter for screening for upper                            gastrointestinal disorder CPT copyright 2022 [employer]. All rights reserved. The codes documented in this report are preliminary and upon coder review may  be revised to meet current compliance requirements. Corliss Parish, MD 05/22/2023 10:15:20 AM Number of Addenda: 0

## 2023-05-22 NOTE — Discharge Instructions (Signed)
YOU HAD AN ENDOSCOPIC PROCEDURE TODAY: Refer to the procedure report and other information in the discharge instructions given to you for any specific questions about what was found during the examination. If this information does not answer your questions, please call [place] office at [phone] to clarify.  ° °YOU SHOULD EXPECT: Some feelings of bloating in the abdomen. Passage of more gas than usual. Walking can help get rid of the air that was put into your GI tract during the procedure and reduce the bloating. If you had a lower endoscopy (such as a colonoscopy or flexible sigmoidoscopy) you may notice spotting of blood in your stool or on the toilet paper. Some abdominal soreness may be present for a day or two, also. ° °DIET: Your first meal following the procedure should be a light meal and then it is ok to progress to your normal diet. A half-sandwich or bowl of soup is an example of a good first meal. Heavy or fried foods are harder to digest and may make you feel nauseous or bloated. Drink plenty of fluids but you should avoid alcoholic beverages for 24 hours. If you had a esophageal dilation, please see attached instructions for diet.   ° °ACTIVITY: Your care partner should take you home directly after the procedure. You should plan to take it easy, moving slowly for the rest of the day. You can resume normal activity the day after the procedure however YOU SHOULD NOT DRIVE, use power tools, machinery or perform tasks that involve climbing or major physical exertion for 24 hours (because of the sedation medicines used during the test).  ° °SYMPTOMS TO REPORT IMMEDIATELY: °A gastroenterologist can be reached at any hour. Please call [phone]  for any of the following symptoms:  °Following lower endoscopy (colonoscopy, flexible sigmoidoscopy) °Excessive amounts of blood in the stool  °Significant tenderness, worsening of abdominal pains  °Swelling of the abdomen that is new, acute  °Fever of 100° or  higher  °Following upper endoscopy (EGD, EUS, ERCP, esophageal dilation) °Vomiting of blood or coffee ground material  °New, significant abdominal pain  °New, significant chest pain or pain under the shoulder blades  °Painful or persistently difficult swallowing  °New shortness of breath  °Black, tarry-looking or red, bloody stools ° °FOLLOW UP:  °If any biopsies were taken you will be contacted by phone or by letter within the next 1-3 weeks. Call [phone]  if you have not heard about the biopsies in 3 weeks.  °Please also call with any specific questions about appointments or follow up tests. ° °

## 2023-05-22 NOTE — Anesthesia Postprocedure Evaluation (Signed)
Anesthesia Post Note  Patient: TREXTON STACHOWSKI  Procedure(s) Performed: COLONOSCOPY WITH PROPOFOL ESOPHAGOGASTRODUODENOSCOPY (EGD) WITH PROPOFOL BIOPSY     Patient location during evaluation: Endoscopy Anesthesia Type: MAC Level of consciousness: oriented, awake and alert and awake Pain management: pain level controlled Vital Signs Assessment: post-procedure vital signs reviewed and stable Respiratory status: spontaneous breathing, nonlabored ventilation, respiratory function stable and patient connected to nasal cannula oxygen Cardiovascular status: blood pressure returned to baseline and stable Postop Assessment: no headache, no backache and no apparent nausea or vomiting Anesthetic complications: no   No notable events documented.  Last Vitals:  Vitals:   05/22/23 1044 05/22/23 1054  BP: 119/77 (!) 123/58  Pulse: 69 65  Resp: 12 19  Temp:    SpO2: 100% 100%    Last Pain:  Vitals:   05/22/23 1054  TempSrc:   PainSc: 0-No pain                 Collene Schlichter

## 2023-05-22 NOTE — Transfer of Care (Signed)
Immediate Anesthesia Transfer of Care Note  Patient: Brandon [name]  Procedure(s) Performed: COLONOSCOPY WITH PROPOFOL ESOPHAGOGASTRODUODENOSCOPY (EGD) WITH PROPOFOL BIOPSY  Patient Location: PACU and Endoscopy Unit  Anesthesia Type:MAC  Level of Consciousness: drowsy and responds to stimulation  Airway & Oxygen Therapy: Patient Spontanous Breathing and Patient connected to face mask oxygen  Post-op Assessment: Report given to RN and Post -op Vital signs reviewed and stable  Post vital signs: Reviewed and stable  Last Vitals:  Vitals Value Taken Time  BP 103/71   Temp    Pulse 86   Resp 16   SpO2 100     Last Pain:  Vitals:   05/22/23 0835  TempSrc: Temporal  PainSc: 0-No pain         Complications: No notable events documented.

## 2023-05-22 NOTE — Anesthesia Procedure Notes (Signed)
Procedure Name: MAC Date/Time: 05/22/2023 9:21 AM  Performed by: Sindy Guadeloupe, CRNAPre-anesthesia Checklist: Patient identified, Emergency Drugs available, Suction available, Patient being monitored and Timeout performed Oxygen Delivery Method: Simple face mask Placement Confirmation: positive ETCO2

## 2023-05-22 NOTE — Anesthesia Preprocedure Evaluation (Addendum)
Anesthesia Evaluation  Patient identified by MRN, date of birth, ID band  Reviewed: Allergy & Precautions, NPO status , Patient's Chart, lab work & pertinent test results, reviewed documented beta blocker date and time   Airway Mallampati: II  TM Distance: >3 FB Neck ROM: Full    Dental  (+) Dental Advisory Given, Missing, Poor Dentition   Pulmonary neg pulmonary ROS   Pulmonary exam normal breath sounds clear to auscultation       Cardiovascular hypertension, Pt. on home beta blockers and Pt. 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), chronic constipation   Endo/Other  negative endocrine ROS    Renal/GU negative Renal ROS     Musculoskeletal  (+) Arthritis ,    Abdominal   Peds  Hematology negative hematology ROS (+)   Anesthesia Other Findings   Reproductive/Obstetrics                             Anesthesia Physical Anesthesia Plan  ASA: 3  Anesthesia Plan: MAC   Post-op Pain Management:    Induction: Intravenous  PONV Risk Score and Plan: 1 and TIVA and Treatment may vary due to age or medical condition  Airway Management Planned: Natural Airway and Simple Face Mask  Additional Equipment:   Intra-op Plan:   Post-operative Plan:   Informed Consent: I have reviewed the patients History and Physical, chart, labs and discussed the procedure including the risks, benefits and alternatives for the proposed anesthesia with the patient or authorized representative who has indicated his/her understanding and acceptance.     Dental advisory given  Plan Discussed with: CRNA and Anesthesiologist  Anesthesia Plan Comments:          Anesthesia Quick Evaluation

## 2023-05-22 NOTE — Progress Notes (Addendum)
Pt presented to endoscopy today for EGD/Colonoscopy with MD Mansouraty. In pre-op, pt describes last BM as liquid brown w/ small solid pieces. MD Mansouraty notified. VO for FLEET enema x1. Medication given as ordered.  Eulas Post, RN 05/22/23 9:11 AM  Addendum [phone]: Enema complete. Pt had a opaque liquid light brown BM.  Eulas Post, RN 05/22/23 9:19 AM

## 2023-05-22 NOTE — Op Note (Signed)
[facility] Patient Name: Brandon [name] Procedure Date: 05/22/2023 MRN: [id number] Attending MD: Corliss Parish , MD, [id number] Date of Birth: [birth year] CSN: [id number] Age: 77 Admit Type: Outpatient Procedure:                Colonoscopy Indications:              Screening for colorectal malignant neoplasm,                            History of previous sigmoid volvulus status post                            resection and reattachment Providers:                Corliss Parish, MD, Eliberto Ivory, RN, Harrington Challenger, Technician Referring MD:             Shelbie Ammons. Sherryll Burger Medicines:                Monitored Anesthesia Care Complications:            No immediate complications. Estimated Blood Loss:     Estimated blood loss: none. Procedure:                Pre-Anesthesia Assessment:                           - Prior to the procedure, a History and Physical                            was performed, and patient medications and                            allergies were reviewed. The patient's tolerance of                            previous anesthesia was also reviewed. The risks                            and benefits of the procedure and the sedation                            options and risks were discussed with the patient.                            All questions were answered, and informed consent                            was obtained. Prior Anticoagulants: The patient has                            taken no anticoagulant or antiplatelet agents                            except for aspirin.  ASA Grade Assessment: III - A                            patient with severe systemic disease. After                            reviewing the risks and benefits, the patient was                            deemed in satisfactory condition to undergo the                            procedure.                           After obtaining informed consent, the  colonoscope                            was passed under direct vision. Throughout the                            procedure, the patient's blood pressure, pulse, and                            oxygen saturations were monitored continuously. The                            [id number]-HQ190L ([id number]) Olympus colonoscope was                            introduced through the anus and advanced to the the                            cecum, identified by appendiceal orifice and                            ileocecal valve. The patient tolerated the                            procedure. The colonoscopy was somewhat difficult                            due to inadequate bowel prep. Successful completion                            of the procedure was aided by changing the                            patient's position, using manual pressure,                            straightening and shortening the scope to obtain                            bowel  loop reduction, using scope torsion and                            lavage. Scope In: 9:40:56 AM Scope Out: 10:05:09 AM Scope Withdrawal Time: 0 hours 17 minutes 11 seconds  Total Procedure Duration: 0 hours 24 minutes 13 seconds  Findings:      The digital rectal exam findings include hemorrhoids. Pertinent       negatives include no palpable rectal lesions.      Copious quantities of semi-liquid stool was found in the entire colon,       interfering with visualization. Lavage of the area was performed using       copious amounts, resulting in clearance with fair visualization.      Non-bleeding non-thrombosed external and internal hemorrhoids were found       during retroflexion, during perianal exam and during digital exam. The       hemorrhoids were Grade II (internal hemorrhoids that prolapse but reduce       spontaneously). Impression:               - Hemorrhoids found on digital rectal exam.                           - Stool in the entire examined colon.  Lavaged                            copiously with only fair visualization.                           - Small lesions could have been missed, but large                            masses were not felt to be present.                           - Non-bleeding non-thrombosed external and internal                            hemorrhoids. Moderate Sedation:      Not Applicable - Patient had care per Anesthesia. Recommendation:           - The patient will be observed post-procedure,                            until all discharge criteria are met.                           - Discharge patient to home.                           - Patient has a contact number available for                            emergencies. The signs and symptoms of potential                            delayed complications were discussed  with the                            patient. Return to normal activities tomorrow.                            Written discharge instructions were provided to the                            patient.                           - High fiber diet.                           - Continue present medications.                           - Small lesions could have been missed due to the                            significant preparation inadequacy/fairness. We                            could be more aggressive with laxative therapy.                            Recommend daily MiraLAX and can use Dulcolax every                            second or third day (though patient had reported                            that he has bowel movements on a daily basis).                           - If we consider repeating colonoscopy in this                            gentleman, he will need a 2-day preparation. This                            could be considered within the next 6 to 12 months.                           - The findings and recommendations were discussed                            with the patient.                            - The findings and recommendations were discussed                            with the patient's family. Procedure Code(s):        ---  Professional ---                           W0981, Colorectal cancer screening; colonoscopy on                            individual not meeting criteria for high risk Diagnosis Code(s):        --- Professional ---                           Z12.11, Encounter for screening for malignant                            neoplasm of colon                           K64.1, Second degree hemorrhoids CPT copyright 2022 [employer]. All rights reserved. The codes documented in this report are preliminary and upon coder review may  be revised to meet current compliance requirements. Corliss Parish, MD 05/22/2023 10:23:14 AM Number of Addenda: 0

## 2023-05-22 NOTE — H&P (Signed)
GASTROENTEROLOGY PROCEDURE H&P NOTE   Primary Care Physician: Ellyn Hack, MD  HPI: Brandon [name] is a 77 y.o. male who presents for EGD/colonoscopy for Barrett's esophagus screening in setting of long-term GERD, colon cancer screening with prior colectomy and chronic constipation.  Past Medical History:  Diagnosis Date   Allergy    Arthritis    Bilateral knee swelling    Glaucoma    Hypertension    Prostate disorder    Schizophrenia (HCC)    Weakness of lower extremity    left knee, uses walker   Past Surgical History:  Procedure Laterality Date   ANKLE SURGERY  1997   had broken right ankle/had 3 surgeries in 1996/ has pins and rods   BOWEL DECOMPRESSION N/A 05/07/2021   Procedure: BOWEL DECOMPRESSION;  Surgeon: Lemar Lofty., MD;  Location: [facility];  Service: Gastroenterology;  Laterality: N/A;   BOWEL DECOMPRESSION N/A 05/06/2021   Procedure: BOWEL DECOMPRESSION;  Surgeon: Rachael Fee, MD;  Location: [facility];  Service: Endoscopy;  Laterality: N/A;   COLONOSCOPY WITH PROPOFOL N/A 05/06/2021   Procedure: COLONOSCOPY WITH PROPOFOL;  Surgeon: Rachael Fee, MD;  Location: [facility];  Service: Endoscopy;  Laterality: N/A;   FLEXIBLE SIGMOIDOSCOPY N/A 05/07/2021   Procedure: FLEXIBLE SIGMOIDOSCOPY;  Surgeon: Meridee Score Netty Starring., MD;  Location: [facility];  Service: Gastroenterology;  Laterality: N/A;   LAPAROTOMY N/A 05/08/2021   Procedure: EXPLORATORY LAPAROTOMY, SIGMOID COLECTOMY, COLOSTOMY;  Surgeon: Griselda Miner, MD;  Location: [facility];  Service: General;  Laterality: N/A;   LYSIS OF ADHESION N/A 09/23/2021   Procedure: ROBOTIC LYSIS OF ADHESIONS;  Surgeon: Karie Soda, MD;  Location: [facility];  Service: General;  Laterality: N/A;   PROCTOSCOPY N/A 09/23/2021   Procedure: RIGID PROCTOSCOPY;  Surgeon: Karie Soda, MD;  Location: [facility];  Service: General;  Laterality: N/A;   XI ROBOTIC ASSISTED COLOSTOMY TAKEDOWN N/A 09/23/2021    Procedure: XI ROBOTIC ASSISTED COLOSTOMY TAKEDOWN WITH BILATERAL TAP BLOCK;  Surgeon: Karie Soda, MD;  Location: [facility];  Service: General;  Laterality: N/A;   Current Facility-Administered Medications  Medication Dose Route Frequency Provider Last Rate Last Admin   0.9 %  sodium chloride infusion   Intravenous Continuous Mansouraty, Netty Starring., MD        Current Facility-Administered Medications:    0.9 %  sodium chloride infusion, , Intravenous, Continuous, Mansouraty, Netty Starring., MD No Known Allergies Family History  Problem Relation Age of Onset   Kidney disease Mother    Breast cancer Mother    Breast cancer Father    Leukemia Father    Social History   Socioeconomic History   Marital status: Single    Spouse name: Not on file   Number of children: Not on file   Years of education: Not on file   Highest education level: Not on file  Occupational History   Not on file  Tobacco Use   Smoking status: Never   Smokeless tobacco: Never  Vaping Use   Vaping status: Never Used  Substance and Sexual Activity   Alcohol use: No   Drug use: No   Sexual activity: Not on file  Other Topics Concern   Not on file  Social History Narrative   Not on file   Social Determinants of Health   Financial Resource Strain: Not on file  Food Insecurity: Not on file  Transportation Needs: Not on file  Physical Activity: Not on file  Stress: Not on  file  Social Connections: Not on file  Intimate Partner Violence: Not on file    Physical Exam: Today's Vitals   05/22/23 0835  PainSc: 0-No pain   There is no height or weight on file to calculate BMI. GEN: NAD EYE: Sclerae anicteric ENT: MMM CV: Non-tachycardic GI: Soft, surgical scars noted in mid abdomen NEURO:  Alert & Oriented x 3  Lab Results: No results for input(s): "WBC", "HGB", "HCT", "PLT" in the last 72 hours. BMET No results for input(s): "NA", "K", "CL", "CO2", "GLUCOSE", "BUN", "CREATININE", "CALCIUM" in the  last 72 hours. LFT No results for input(s): "PROT", "ALBUMIN", "AST", "ALT", "ALKPHOS", "BILITOT", "BILIDIR", "IBILI" in the last 72 hours. PT/INR No results for input(s): "LABPROT", "INR" in the last 72 hours.   Impression / Plan: This is a 77 y.o.male who presents for EGD/colonoscopy for Barrett's esophagus screening in setting of long-term GERD, colon cancer screening with prior colectomy and chronic constipation.  The risks and benefits of endoscopic evaluation/treatment were discussed with the patient and/or family; these include but are not limited to the risk of perforation, infection, bleeding, missed lesions, lack of diagnosis, severe illness requiring hospitalization, as well as anesthesia and sedation related illnesses.  The patient's history has been reviewed, patient examined, no change in status, and deemed stable for procedure.  The patient and/or family is agreeable to proceed.    Corliss Parish, MD [facility] Office # [phone]

## 2023-05-23 ENCOUNTER — Encounter: Payer: Self-pay | Admitting: Gastroenterology

## 2023-05-25 ENCOUNTER — Encounter (HOSPITAL_COMMUNITY): Payer: Self-pay | Admitting: Gastroenterology

## 2023-06-06 ENCOUNTER — Telehealth: Payer: Self-pay | Admitting: Gastroenterology

## 2023-06-06 ENCOUNTER — Other Ambulatory Visit: Payer: Self-pay

## 2023-06-06 MED ORDER — METOPROLOL SUCCINATE ER 25 MG PO TB24: 25.0000 mg | ORAL_TABLET | Freq: Every day | ORAL | 0 refills | Status: DC

## 2023-06-06 NOTE — Telephone Encounter (Signed)
Line rings no answer no voice mail  °

## 2023-06-06 NOTE — Telephone Encounter (Signed)
Pt's medication was sent to pt's pharmacy as requested. Confirmation received.  °

## 2023-06-06 NOTE — Telephone Encounter (Signed)
DANTEZ VERDUCCI                                                                                                        05/23/2023 [address] [place] [address]                                                                               Dear Mr. Lazard,   I am writing to inform you that the biopsies taken during your recent endoscopic examination showed:     FINAL MICROSCOPIC DIAGNOSIS:  A. STOMACH, BIOPSY:       Gastric antral mucosa with reactive epithelial changes.       Gastric oxyntic mucosa without significant diagnostic alteration.       No H. pylori identified on HE.       Negative for intestinal metaplasia or dysplasia.     What does this all mean? The stomach biopsies showed reactive changes but no evidence of H. pylori infection or precancerous changes.   Please continue taking the increased dosing of your acid medicines to heal your esophagus.   Will plan a repeat upper endoscopy in 4 to 5 months.  If you are interested in doing your repeat attempt at colonoscopy at that time we will work on scheduling both together.   We have you set up for a clinic visit on 08/23/23 at 9:10 am as well to see how you are doing.   Please call us at [phone] if you have persistent problems or have questions about your condition that have not been fully answered at this time.   Sincerely,   Lemar Lofty., MD            [facility]                                [place], [phone]                   1

## 2023-06-06 NOTE — Telephone Encounter (Signed)
Inbound call from patient's sister requesting a call back regarding 8/5 procedure results. Please advise, thank you.

## 2023-06-07 NOTE — Telephone Encounter (Signed)
I spoke with the pt sister who is the caregiver and made her aware of the information in the letter.  She has asked that the appt for follow up be moved to another date later in the day.  I have moved that appt to 11/15 at 1050 am.  No further questions or concerns

## 2023-07-18 ENCOUNTER — Ambulatory Visit (INDEPENDENT_AMBULATORY_CARE_PROVIDER_SITE_OTHER): Payer: Medicare PPO | Admitting: Podiatry

## 2023-07-18 DIAGNOSIS — Z91199 Patient's noncompliance with other medical treatment and regimen due to unspecified reason: Secondary | ICD-10-CM

## 2023-07-18 NOTE — Progress Notes (Signed)
1. No-show for appointment     

## 2023-07-21 ENCOUNTER — Other Ambulatory Visit: Payer: Self-pay | Admitting: Urology

## 2023-07-21 DIAGNOSIS — R3915 Urgency of urination: Secondary | ICD-10-CM

## 2023-07-21 DIAGNOSIS — R972 Elevated prostate specific antigen [PSA]: Secondary | ICD-10-CM

## 2023-07-21 DIAGNOSIS — N401 Enlarged prostate with lower urinary tract symptoms: Secondary | ICD-10-CM

## 2023-07-31 ENCOUNTER — Encounter: Payer: Self-pay | Admitting: Podiatry

## 2023-07-31 ENCOUNTER — Ambulatory Visit (INDEPENDENT_AMBULATORY_CARE_PROVIDER_SITE_OTHER): Payer: Medicare PPO | Admitting: Podiatry

## 2023-07-31 VITALS — BP 154/79 | HR 107

## 2023-07-31 DIAGNOSIS — L853 Xerosis cutis: Secondary | ICD-10-CM

## 2023-07-31 DIAGNOSIS — R601 Generalized edema: Secondary | ICD-10-CM | POA: Diagnosis not present

## 2023-07-31 DIAGNOSIS — M79675 Pain in left toe(s): Secondary | ICD-10-CM | POA: Diagnosis not present

## 2023-07-31 DIAGNOSIS — I739 Peripheral vascular disease, unspecified: Secondary | ICD-10-CM | POA: Diagnosis not present

## 2023-07-31 DIAGNOSIS — M79674 Pain in right toe(s): Secondary | ICD-10-CM | POA: Diagnosis not present

## 2023-07-31 DIAGNOSIS — L84 Corns and callosities: Secondary | ICD-10-CM

## 2023-07-31 DIAGNOSIS — B351 Tinea unguium: Secondary | ICD-10-CM

## 2023-07-31 MED ORDER — AMMONIUM LACTATE 12 % EX CREA
1.0000 | TOPICAL_CREAM | CUTANEOUS | 3 refills | Status: AC | PRN
Start: 2023-07-31 — End: ?

## 2023-07-31 NOTE — Progress Notes (Unsigned)
Subjective:  Patient ID: Brandon [name], male    DOB: 1945-12-21,  MRN: [id number]  Brandon [name] presents to clinic today for:  Chief Complaint  Patient presents with   Nail Problem    "Trim my toenails."   Callouses    "I want her to trim a corn too."   Patient notes nails are thick and elongated, causing pain in shoe gear when ambulating.  He gets a recurring painful callus on the right great toe.  PCP is Ellyn Hack, MD.  Past Medical History:  Diagnosis Date   Allergy    Arthritis    Bilateral knee swelling    Glaucoma    Hypertension    Prostate disorder    Schizophrenia (HCC)    Weakness of lower extremity    left knee, uses walker    No Known Allergies  Objective:  Vitals:   07/31/23 1615  BP: (!) 154/79  Pulse: (!) 107    Brandon [name] is a pleasant 77 y.o. male in NAD. AAO x 3.  Vascular Examination: Patient has palpable DP pulse, absent PT pulse bilateral.  Delayed capillary refill bilateral toes.  Sparse digital hair bilateral.  Proximal to distal cooling WNL bilateral.  +2 pitting edema bilateral lower legs and ankles.  Dermatological Examination: Interspaces are clear with no open lesions noted bilateral.  Skin is shiny and atrophic bilateral.  Nails are 3-43mm thick, with yellowish/brown discoloration, subungual debris and distal onycholysis x10.  There is pain with compression of nails x10.  There are hyperkeratotic lesions noted on the plantar medial aspect of the right hallux proximal to the IP joint.  There is severely dry and flaky skin on the dorsal aspect of both feet as well as the lower legs bilateral.  The plantar aspect of both feet is clear of any peeling or scaling.  Orthopedic examination: There is severe hallux valgus which is not fully reducible on the right foot.  There is a bony medial prominence on the first metatarsal head consistent with bunion deformity.  Patient qualifies for at-risk foot care because of  PVD.  Assessment/Plan: 1. Xerosis of skin   2. PAD (peripheral artery disease) (HCC)   3. Corns and callosities   4. Pain due to onychomycosis of toenails of both feet   5. Generalized edema     Meds ordered this encounter  Medications   ammonium lactate (AMLACTIN) 12 % cream    Sig: Apply 1 Application topically as needed for dry skin.    Dispense:  385 g    Refill:  3   Mycotic nails x10 were sharply debrided with sterile nail nippers and power debriding burr to decrease bulk and length.  Hyperkeratotic lesion x 1 was shaved with #312 blade.  Prescription for Lac-Hydrin cream 12% sent to his pharmacy.  He will apply this daily to the skin.  Recommend compression knee-high socks or stockings to wear for edema management.  He may also occasionally lay down and elevate the legs above his heart level for 30 minutes, 2-3 times per day to help gravity improve some of the swelling in the lower extremities.   Return in about 3 months (around 10/31/2023) for RFC.   Clerance Lav, DPM, FACFAS [facility]     [address]  [place], [place] [address]                Office [phone]  Fax [phone]

## 2023-08-04 IMAGING — CT CT ANGIO CHEST
2 of 7 series · 18 of 46 positions shown · IV contrast (OMNIPAQUE)
Comparison: None.

CLINICAL DATA: Shortness of breath, tachycardia

EXAM:
CT ANGIOGRAPHY CHEST WITH CONTRAST
TECHNIQUE: Multidetector CT imaging of the chest was performed using the
standard protocol during bolus administration of intravenous
contrast. Multiplanar CT image reconstructions and MIPs were
obtained to evaluate the vascular anatomy.

[Series 5: thins · axial · 0.79mm/px · z∈[-177,+105]mm · 16 of 320 slices shown]
[im 19/320  lung]
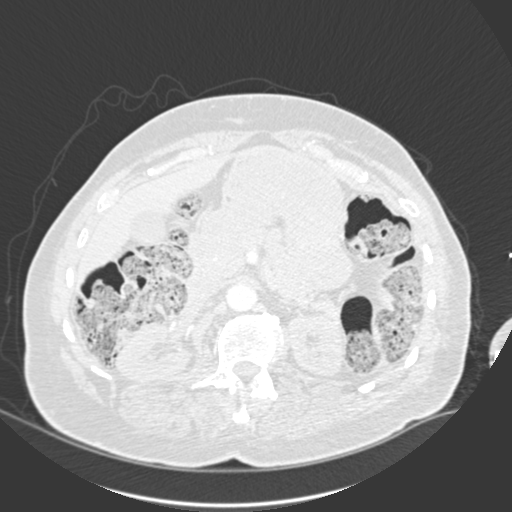
[im 38/320  soft-tissue]
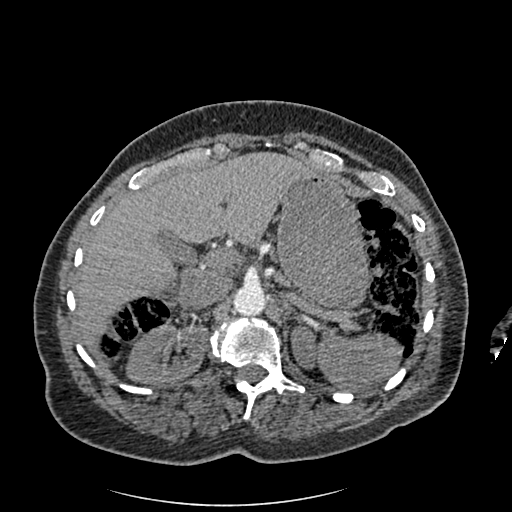
[im 57/320  lung]
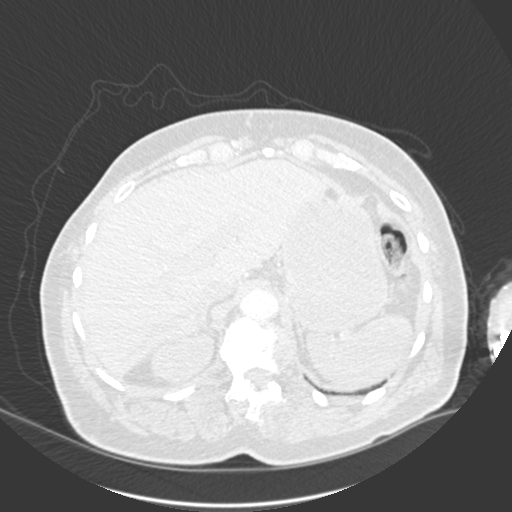
[im 76/320  soft-tissue]
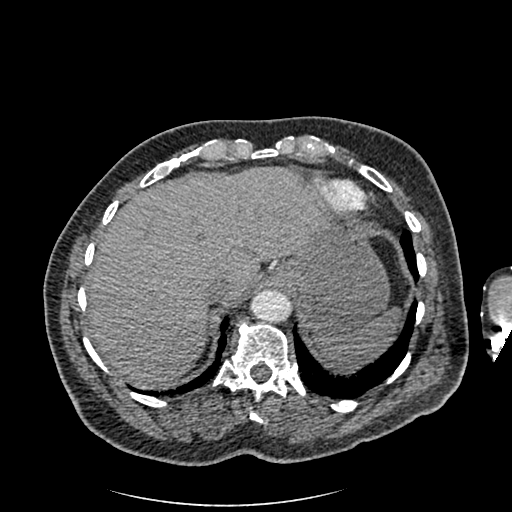
[im 94/320  lung]
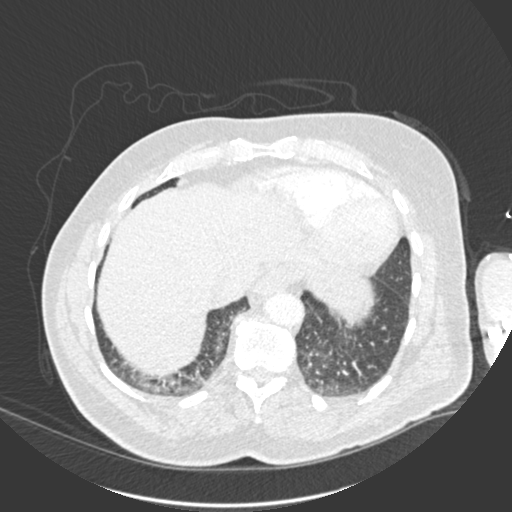
[im 113/320  soft-tissue]
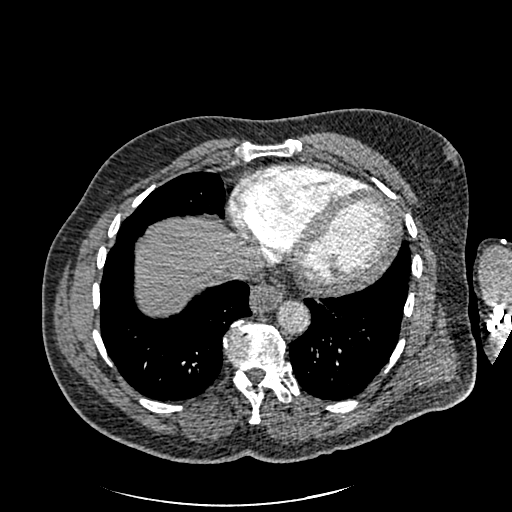
[im 132/320  lung]
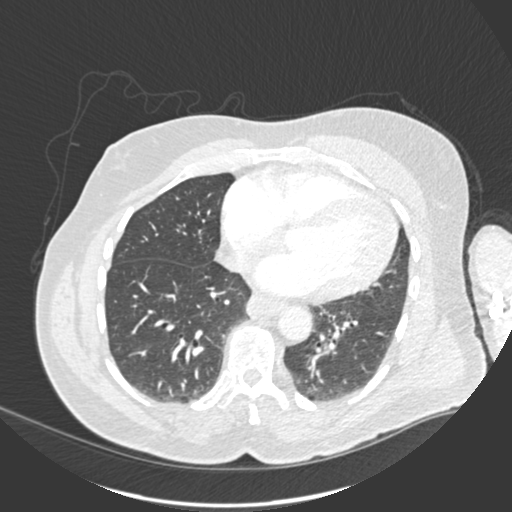
[im 151/320  soft-tissue]
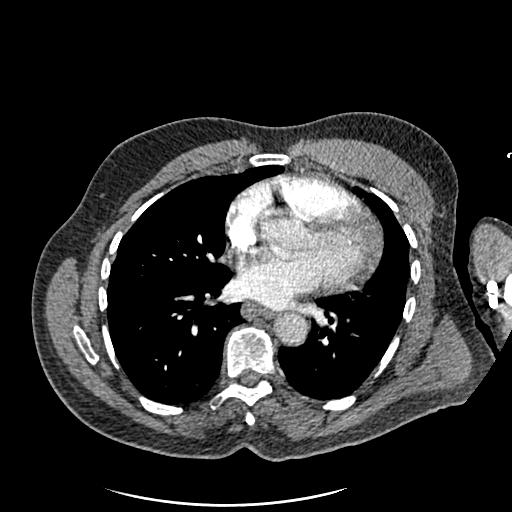
[im 169/320  lung]
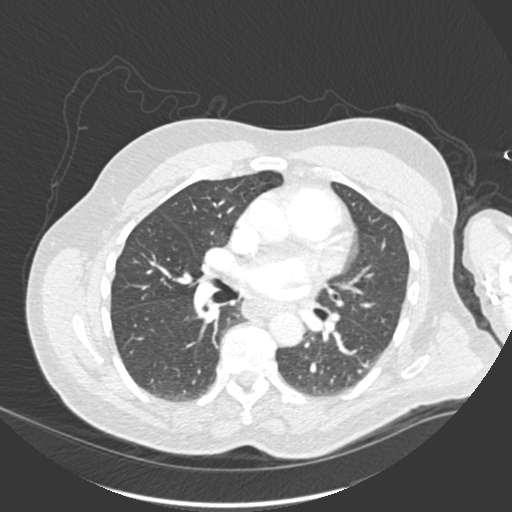
[im 188/320  soft-tissue]
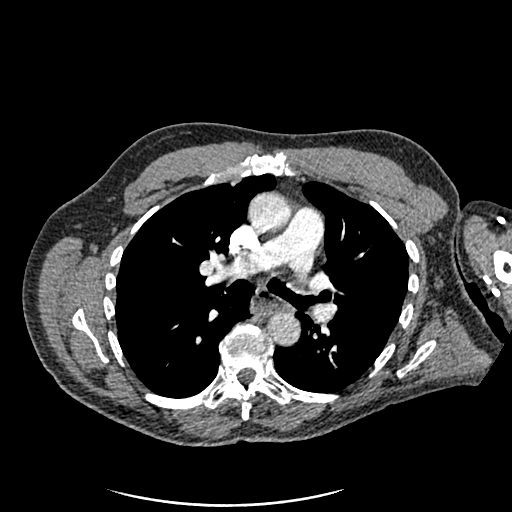
[im 207/320  lung]
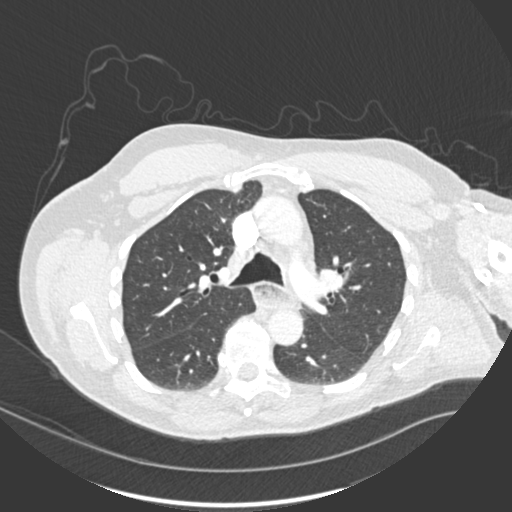
[im 226/320  soft-tissue]
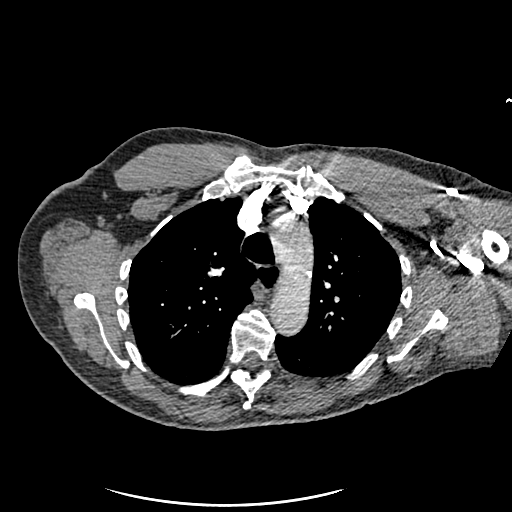
[im 244/320  lung]
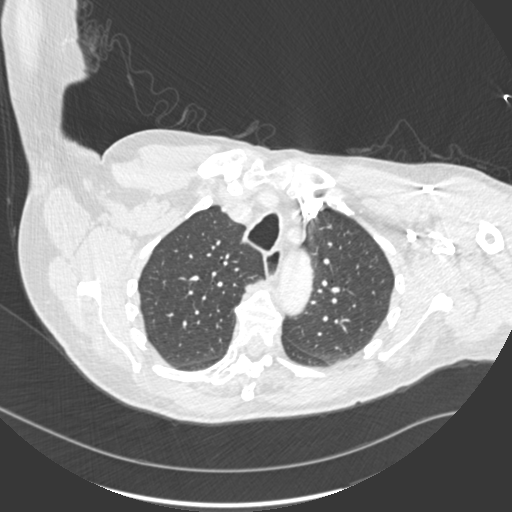
[im 263/320  soft-tissue]
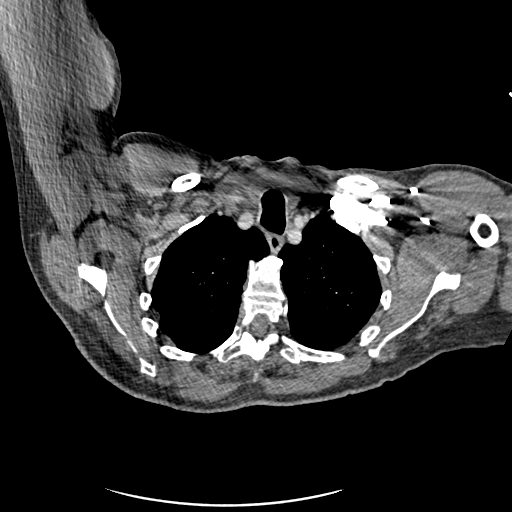
[im 282/320  lung]
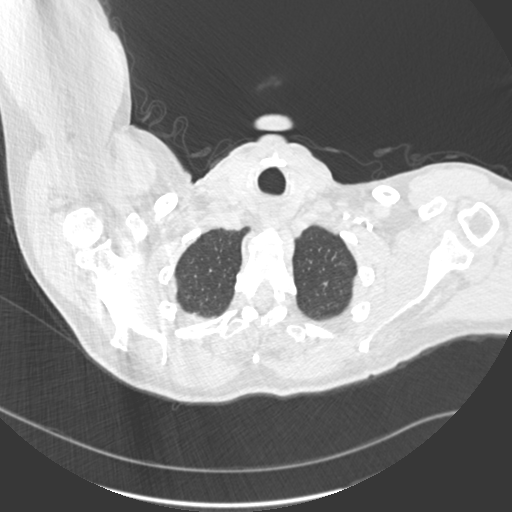
[im 301/320  soft-tissue]
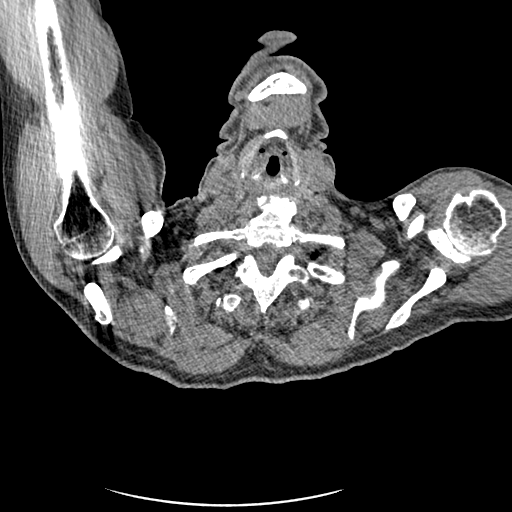

[Series 7: coronal mpr · coronal · 0.72mm/px · 2 of 79 slices shown]
[im 27/79  soft-tissue]
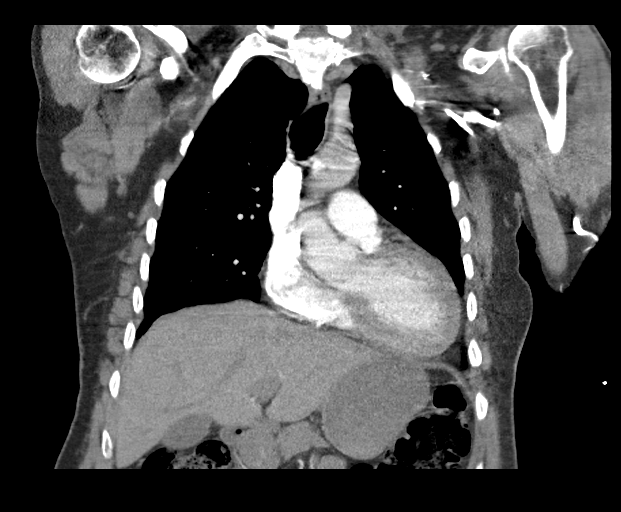
[im 53/79  soft-tissue]
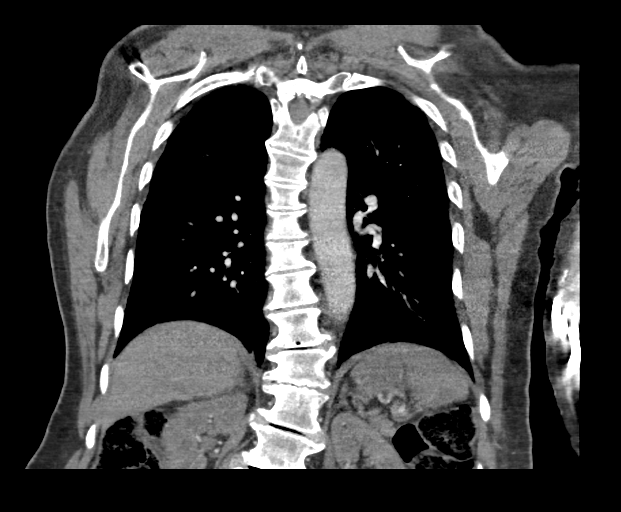

[18 of 46 positions shown; findings below may reference images not displayed]

RADIATION DOSE REDUCTION: This exam was performed according to the
departmental dose-optimization program which includes automated
exposure control, adjustment of the mA and/or kV according to
patient size and/or use of iterative reconstruction technique.

CONTRAST:  75mL OMNIPAQUE IOHEXOL 350 MG/ML SOLN
FINDINGS: Cardiovascular: Heart is enlarged in size. There is homogeneous
enhancement in thoracic aorta. There are no intraluminal filling
defects in central pulmonary artery branches. Evaluation of small
peripheral pulmonary artery branches is limited by motion artifacts
and less than optimal contrast enhancement.

Mediastinum/Nodes: No significant lymphadenopathy seen.

Lungs/Pleura: There is no focal pulmonary consolidation. There is no
pleural effusion or pneumothorax.

Upper Abdomen: Small hiatal hernia is seen. There is fluid in the
lumen of thoracic esophagus.

Musculoskeletal: There is levoscoliosis in the lower thoracic spine
and mild dextroscoliosis in the upper thoracic spine. Degenerative
changes are noted in the visualized lower cervical spine.

Review of the MIP images confirms the above findings.
IMPRESSION: There is no evidence of central pulmonary artery embolism. There is
no evidence of thoracic aortic dissection. There is no focal
pulmonary consolidation.

There is fluid in the lumen of thoracic esophagus suggesting
gastroesophageal reflux.

## 2023-08-23 ENCOUNTER — Ambulatory Visit: Payer: Medicare PPO | Admitting: Gastroenterology

## 2023-08-24 ENCOUNTER — Other Ambulatory Visit: Payer: Self-pay | Admitting: Physician Assistant

## 2023-09-01 ENCOUNTER — Ambulatory Visit (INDEPENDENT_AMBULATORY_CARE_PROVIDER_SITE_OTHER): Payer: Medicare PPO | Admitting: Gastroenterology

## 2023-09-01 ENCOUNTER — Encounter: Payer: Self-pay | Admitting: Gastroenterology

## 2023-09-01 VITALS — BP 136/76 | HR 80 | Ht 70.0 in | Wt 206.3 lb

## 2023-09-01 DIAGNOSIS — Z1211 Encounter for screening for malignant neoplasm of colon: Secondary | ICD-10-CM | POA: Diagnosis not present

## 2023-09-01 DIAGNOSIS — K449 Diaphragmatic hernia without obstruction or gangrene: Secondary | ICD-10-CM

## 2023-09-01 DIAGNOSIS — K219 Gastro-esophageal reflux disease without esophagitis: Secondary | ICD-10-CM

## 2023-09-01 DIAGNOSIS — K2100 Gastro-esophageal reflux disease with esophagitis, without bleeding: Secondary | ICD-10-CM

## 2023-09-01 NOTE — Progress Notes (Unsigned)
GASTROENTEROLOGY OUTPATIENT CLINIC VISIT   Primary Care Provider Ellyn Hack, MD [address] [place] [address] [phone]   Patient Profile: Brandon [name] is a 77 y.o. 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  The patient presents to the [facility] for an evaluation and management of problem(s) noted below:  Problem List No diagnosis found.   History of Present Illness Please see prior GI notes for full details of HPI.  Interval History Patient returns for follow-up with his sisters.  He is doing well.  He still deals with constipation but uses the bathroom on a daily basis per his report.  He does not have to use laxative therapy regularly.  No blood in his stools.  He was to see Dr. Christella Hartigan in follow-up to consider repeat colonoscopy since his preparation was fair and could have had polyps that may have been missed.  He has not had issues since his ostomy reversal.  He and his family still think that he is healthy enough to undergo colonoscopy.  He does have GERD symptoms but they have been controlled on his current PPI therapy.  GI Review of Systems Positive as above Negative for dysphagia, odynophagia, nausea, vomiting, melena, hematochezia  Review of Systems General: Denies fevers/chills/weight loss unintentionally Cardiovascular: Denies chest pain Pulmonary: Denies shortness of breath Gastroenterological: See HPI Genitourinary: Denies darkened urine Hematological: Denies easy bruising/bleeding Endocrine: Denies temperature intolerance Dermatological: Denies jaundice Psychological: Mood is stable   Medications Current Outpatient Medications  Medication Sig Dispense Refill   acetaminophen (TYLENOL) 650 MG CR tablet Take 650 mg by mouth every 8 (eight) hours as needed  for pain.     ammonium lactate (AMLACTIN) 12 % cream Apply 1 Application topically as needed for dry skin. 385 g 3   aspirin EC 81 MG tablet Take 81 mg by mouth daily.     buPROPion (WELLBUTRIN SR) 150 MG 12 hr tablet Take 150 mg by mouth daily.     Cholecalciferol (VITAMIN D3) 25 MCG (1000 UT) CAPS Take 2,000 Units by mouth daily.     Cod Liver Oil 1000 MG CAPS Take 1,000 mg by mouth daily.     finasteride (PROSCAR) 5 MG tablet Take 5 mg by mouth daily.     fluticasone (FLONASE) 50 MCG/ACT nasal spray Place 1 spray into both nostrils daily as needed for allergies or rhinitis.     ketoconazole (NIZORAL) 2 % cream Apply to both feet and between toes once daily for 6 weeks. 60 g 2   losartan (COZAAR) 50 MG tablet Take 50 mg by mouth daily.     metoprolol succinate (TOPROL-XL) 25 MG 24 hr tablet TAKE 1 TABLET(25 MG) BY MOUTH AT BEDTIME 30 tablet 0   Miconazole Nitrate 2 % AERO Spray between toes once daily. 150 g 2   Omega-3 Fatty Acids (FISH OIL) 1000 MG CAPS Take 1,000 mg by mouth daily.     omeprazole (PRILOSEC) 40 MG capsule Take 1 capsule (40 mg total) by mouth 2 (two) times daily before a meal. 60 capsule 12   oxybutynin (DITROPAN-XL) 5 MG 24 hr tablet Take 5 mg by mouth daily.     polyethylene glycol (MIRALAX) 17 g packet Take 17 g by mouth daily.     rosuvastatin (CRESTOR) 5 MG tablet Take 5 mg by mouth at bedtime.     tamsulosin (  FLOMAX) 0.4 MG CAPS capsule Take 0.4 mg by mouth daily.     Travoprost, BAK Free, (TRAVATAN) 0.004 % SOLN ophthalmic solution Place 1 drop into both eyes at bedtime.     trifluoperazine (STELAZINE) 5 MG tablet Take 5 mg by mouth at bedtime.     No current facility-administered medications for this visit.    Allergies No Known Allergies  Histories Past Medical History:  Diagnosis Date   Allergy    Arthritis    Bilateral knee swelling    Glaucoma    Hypertension    Prostate disorder    Schizophrenia (HCC)    Weakness of lower extremity    left knee,  uses walker   Past Surgical History:  Procedure Laterality Date   ANKLE SURGERY  1997   had broken right ankle/had 3 surgeries in 1996/ has pins and rods   BIOPSY  05/22/2023   Procedure: BIOPSY;  Surgeon: Lemar Lofty., MD;  Location: [facility];  Service: Gastroenterology;;   BOWEL DECOMPRESSION N/A 05/07/2021   Procedure: BOWEL DECOMPRESSION;  Surgeon: Lemar Lofty., MD;  Location: [facility];  Service: Gastroenterology;  Laterality: N/A;   BOWEL DECOMPRESSION N/A 05/06/2021   Procedure: BOWEL DECOMPRESSION;  Surgeon: Rachael Fee, MD;  Location: [facility];  Service: Endoscopy;  Laterality: N/A;   COLONOSCOPY WITH PROPOFOL N/A 05/06/2021   Procedure: COLONOSCOPY WITH PROPOFOL;  Surgeon: Rachael Fee, MD;  Location: [facility];  Service: Endoscopy;  Laterality: N/A;   COLONOSCOPY WITH PROPOFOL N/A 05/22/2023   Procedure: COLONOSCOPY WITH PROPOFOL;  Surgeon: Meridee Score Netty Starring., MD;  Location: [facility];  Service: Gastroenterology;  Laterality: N/A;   ESOPHAGOGASTRODUODENOSCOPY (EGD) WITH PROPOFOL N/A 05/22/2023   Procedure: ESOPHAGOGASTRODUODENOSCOPY (EGD) WITH PROPOFOL;  Surgeon: Meridee Score Netty Starring., MD;  Location: [facility];  Service: Gastroenterology;  Laterality: N/A;   FLEXIBLE SIGMOIDOSCOPY N/A 05/07/2021   Procedure: FLEXIBLE SIGMOIDOSCOPY;  Surgeon: Meridee Score Netty Starring., MD;  Location: [facility];  Service: Gastroenterology;  Laterality: N/A;   LAPAROTOMY N/A 05/08/2021   Procedure: EXPLORATORY LAPAROTOMY, SIGMOID COLECTOMY, COLOSTOMY;  Surgeon: Griselda Miner, MD;  Location: [facility];  Service: General;  Laterality: N/A;   LYSIS OF ADHESION N/A 09/23/2021   Procedure: ROBOTIC LYSIS OF ADHESIONS;  Surgeon: Karie Soda, MD;  Location: [facility];  Service: General;  Laterality: N/A;   PROCTOSCOPY N/A 09/23/2021   Procedure: RIGID PROCTOSCOPY;  Surgeon: Karie Soda, MD;  Location: [facility];  Service: General;  Laterality: N/A;   XI ROBOTIC  ASSISTED COLOSTOMY TAKEDOWN N/A 09/23/2021   Procedure: XI ROBOTIC ASSISTED COLOSTOMY TAKEDOWN WITH BILATERAL TAP BLOCK;  Surgeon: Karie Soda, MD;  Location: [facility];  Service: General;  Laterality: N/A;   Social History   Socioeconomic History   Marital status: Single    Spouse name: Not on file   Number of children: Not on file   Years of education: Not on file   Highest education level: Not on file  Occupational History   Not on file  Tobacco Use   Smoking status: Never   Smokeless tobacco: Never  Vaping Use   Vaping status: Never Used  Substance and Sexual Activity   Alcohol use: No   Drug use: No   Sexual activity: Not on file  Other Topics Concern   Not on file  Social History Narrative   Not on file   Social Determinants of Health   Financial Resource Strain: Not on file  Food Insecurity: Not on file  Transportation Needs: Not  on file  Physical Activity: Not on file  Stress: Not on file  Social Connections: Not on file  Intimate Partner Violence: Not on file   Family History  Problem Relation Age of Onset   Kidney disease Mother    Breast cancer Mother    Breast cancer Father    Leukemia Father    I have reviewed his medical, social, and family history in detail and updated the electronic medical record as necessary.    PHYSICAL EXAMINATION  There were no vitals taken for this visit. Wt Readings from Last 3 Encounters:  03/28/23 199 lb 12.8 oz (90.6 kg)  08/15/22 205 lb 3.2 oz (93.1 kg)  02/03/22 197 lb 12.8 oz (89.7 kg)  GEN: NAD, appears chronically ill but is nontoxic, in a wheelchair, 2 sisters with him  PSYCH: Cooperative, without pressured speech EYE: Conjunctivae pink, sclerae anicteric ENT: MMM CV: Nontachycardic RESP: No audible wheezing GI: NABS, soft, NT/ND, without rebound or guarding, surgical scars present MSK/EXT: Bilateral pedal edema SKIN: No jaundice NEURO:  Alert & Oriented x 3, no focal deficits   REVIEW OF DATA  I reviewed  the following data at the time of this encounter:  GI Procedures and Studies  8/24 EGD - No gross lesions in the proximal esophagus and in the mid esophagus. - LA Grade C esophagitis with no bleeding found distally. - Z-line irregular, 35 cm from the incisors. - 5 cm hiatal hernia. - Erythematous mucosa in the stomach. Biopsied. - No gross lesions in the duodenal bulb, in the first portion of the duodenum and in the second portion of the duodenum.  8/24 Colonoscopy - Hemorrhoids found on digital rectal exam. - Stool in the entire examined colon. Lavaged copiously with only fair visualization. - Small lesions could have been missed, but large masses were not felt to be present. - Non-bleeding non-thrombosed external and internal hemorrhoids.  Pathology FINAL MICROSCOPIC DIAGNOSIS:  A. STOMACH, BIOPSY:       Gastric antral mucosa with reactive epithelial changes.       Gastric oxyntic mucosa without significant diagnostic alteration.       No H. pylori identified on HE.       Negative for intestinal metaplasia or dysplasia.   Laboratory Studies  Reviewed those in epic  Imaging Studies  No new imaging studies to review   ASSESSMENT  Mr. Demlow is a 77 y.o. 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  The patient is seen today for evaluation and management of:  No diagnosis found.  The patient is hemodynamically stable.  Clinically he seems to be stable as well.  His last colonoscopy only showed fair preparation and polyps could have been missed.  Now that he has had ostomy reversal, his bowel habits seem to still be okay.  We are going to offer him 1 last colonoscopy to make sure everything looks clean and that he does not have evidence of polyps.  Will plan to do this in the hospital-based setting.  Will initiate him on  laxative therapy a week before and then typical bowel preparation.  Due to his longstanding GERD symptoms, he deserves at least 1 Barrett's screening, so we will perform an upper endoscopy at the same time.  The risks and benefits of endoscopic evaluation were discussed with the patient; these include but are not limited to the risk of perforation,  infection, bleeding, missed lesions, lack of diagnosis, severe illness requiring hospitalization, as well as anesthesia and sedation related illnesses.  The patient and/or family is agreeable to proceed.  All patient questions were answered to the best of my ability, and the patient agrees to the aforementioned plan of action with follow-up as indicated.   PLAN  Continue current PPI therapy Proceed with scheduling screening endoscopy for Barrett's Proceed with scheduling colonoscopy for updated screening in the setting of inadequate preparation last time - 1 week of laxatives prior to preparation These cases will be performed in the hospital-based setting   No orders of the defined types were placed in this encounter.   New Prescriptions   No medications on file   Modified Medications   No medications on file    Planned Follow Up No follow-ups on file.   Total Time in Face-to-Face and in Coordination of Care for patient including independent/personal interpretation/review of prior testing, medical history, examination, medication adjustment, communicating results with the patient directly, and documentation within the EHR is 30 minutes.   Corliss Parish, MD [facility] Office # [phone]

## 2023-09-01 NOTE — Patient Instructions (Addendum)
Gastritis, Adult Gastritis is irritation and swelling (inflammation) of the stomach. There are two kinds of gastritis: Acute gastritis. This kind develops quickly. Chronic gastritis. This kind is much more common. It develops slowly and lasts for a long time. It is important to get help for this condition. If you do not get help, your stomach can bleed, and you can get sores (ulcers) in your stomach. What are the causes? This condition may be caused by: Germs that get to your stomach and cause an infection. Drinking too much alcohol. Medicines you are taking. Having too much acid in the stomach. Having a disease of the stomach. Other causes may include: An allergic reaction. Some cancer treatments (radiation). Smoking cigarettes or using products that contain nicotine or tobacco. In some cases, the cause of this condition is not known. What increases the risk? Having a disease of the intestines. Having Crohn's disease. Using aspirin or ibuprofen and other NSAIDs to treat other conditions. Stress. What are the signs or symptoms? Pain in your stomach. A burning feeling in your stomach. Feeling like you may vomit (nauseous). Vomiting or vomiting blood. Feeling too full after you eat. Weight loss. Bad breath. Blood in your poop (stool). In some cases, there are no symptoms. How is this treated? This condition is treated with medicines. The medicines that are used depend on what caused the condition. You may be given: Antibiotic medicine, if your condition was caused by an infection from germs. H2 blockers and similar medicines, if your condition was caused by too much acid in the stomach. Treatment may also include stopping the use of certain medicines, such as aspirin or ibuprofen. Follow these instructions at home: Medicines Take over-the-counter and prescription medicines only as told by your doctor. If you were prescribed an antibiotic medicine, take it as told by your doctor.  Do not stop taking it even if you start to feel better. Alcohol use Do not drink alcohol if: Your doctor tells you not to drink. You are pregnant, may be pregnant, or are planning to become pregnant. If you drink alcohol: Limit your use to: 0-1 drink a day for women. 0-2 drinks a day for men. Know how much alcohol is in your drink. In the [place] one drink equals one 12 oz bottle of beer (355 mL), one 5 oz glass of wine (148 mL), or one 1 oz glass of hard liquor (44 mL). General instructions  Eat small meals often, instead of large meals. Avoid foods and drinks that make you feel worse. Drink enough fluid to keep your pee (urine) pale yellow. Talk with your doctor about ways to manage stress. You can exercise or do deep breathing, meditation, or yoga. Do not smoke or use any products that contain nicotine or tobacco. If you need help quitting, ask your doctor. Keep all follow-up visits. Contact a doctor if: Your symptoms get worse. Your stomach pain gets worse. Your symptoms go away and then come back. You have a fever. Get help right away if: You vomit blood or something that looks like coffee grounds. You have black or dark red poop. You throw up any time you try to drink fluids. These symptoms may be an emergency. Get help right away. Call your local emergency services (911 in the [place] Do not wait to see if the symptoms will go away. Do not drive yourself to the hospital. Summary Gastritis is irritation and swelling (inflammation) of the stomach. You must get help for this condition. If you do  not get help, your stomach can bleed, and you can get sores (ulcers) in your stomach. You can be treated with medicines for germs or medicines to block too much acid in your stomach. This information is not intended to replace advice given to you by your health care provider. Make sure you discuss any questions you have with your health care provider. Document Revised: 02/06/2021 Document  Reviewed: 02/06/2021 Elsevier Patient Education  2024 Elsevier Inc. You will be contact with appointment for March Colon/EGD at the hospital when Dr Meridee Score schedule is available.  If you have not heard from our office by late Feb 2025, please contact office to schedule. [phone].   _______________________________________________________  If your blood pressure at your visit was 140/90 or greater, please contact your primary care physician to follow up on this.  _______________________________________________________  If you are age 16 or older, your body mass index should be between 23-30. Your Body mass index is 29.6 kg/m. If this is out of the aforementioned range listed, please consider follow up with your Primary Care Provider.  If you are age 54 or younger, your body mass index should be between 19-25. Your Body mass index is 29.6 kg/m. If this is out of the aformentioned range listed, please consider follow up with your Primary Care Provider.   ________________________________________________________  The [facility] providers would like to encourage you to use [facility] to communicate with providers for non-urgent requests or questions.  Due to long hold times on the telephone, sending your provider a message by [facility] may be a faster and more efficient way to get a response.  Please allow 48 business hours for a response.  Please remember that this is for non-urgent requests.  _______________________________________________________  Thank you for choosing me and [facility].  Dr. Meridee Score

## 2023-09-04 ENCOUNTER — Encounter: Payer: Self-pay | Admitting: Gastroenterology

## 2023-09-04 DIAGNOSIS — K449 Diaphragmatic hernia without obstruction or gangrene: Secondary | ICD-10-CM | POA: Insufficient documentation

## 2023-09-11 NOTE — Progress Notes (Deleted)
  Cardiology Office Note:  .   Date:  09/11/2023  ID:  Brandon [name], DOB July 11, 1946, MRN [id number] PCP: Ellyn Hack, MD  [facility] Providers Cardiologist:  Meriam Sprague, MD (Inactive) { Click to update primary MD,subspecialty MD or APP then REFRESH:1}   History of Present Illness: .    Nov. 26, 2024  Brandon [name] is a 77 y.o. male patient of Dr. Shari Prows I am meeting him for the first time tody  Hx of chronic venous insufficiency, HTN, schizophrenia   ROS: ***  Studies Reviewed: .        *** Risk Assessment/Calculations:   {Does this patient have ATRIAL FIBRILLATION?:[id number]} No BP recorded.  {Refresh Note OR Click here to enter BP  :1}***       Physical Exam:   VS:  There were no vitals taken for this visit.   Wt Readings from Last 3 Encounters:  09/01/23 206 lb 5 oz (93.6 kg)  03/28/23 199 lb 12.8 oz (90.6 kg)  08/15/22 205 lb 3.2 oz (93.1 kg)    GEN: Well nourished, well developed in no acute distress NECK: No JVD; No carotid bruits CARDIAC: ***RRR, no murmurs, rubs, gallops RESPIRATORY:  Clear to auscultation without rales, wheezing or rhonchi  ABDOMEN: Soft, non-tender, non-distended EXTREMITIES:  No edema; No deformity   ASSESSMENT AND PLAN: .   ***    {Are you ordering a CV Procedure (e.g. stress test, cath, DCCV, TEE, etc)?   Press F2        :[id number]}  Dispo: ***  Signed, Kristeen Miss, MD

## 2023-09-12 ENCOUNTER — Ambulatory Visit: Payer: Medicare PPO | Admitting: Cardiovascular Disease

## 2023-09-15 ENCOUNTER — Other Ambulatory Visit: Payer: Self-pay | Admitting: Cardiovascular Disease

## 2023-09-18 MED ORDER — METOPROLOL SUCCINATE ER 25 MG PO TB24: 25.0000 mg | ORAL_TABLET | Freq: Every day | ORAL | 1 refills | Status: DC

## 2023-10-23 NOTE — Progress Notes (Deleted)
 Cardiology Office Note    Patient Name: Brandon [name] Date of Encounter: 10/23/2023  Primary Care Provider:  Maree Leni Edyth DELENA, MD Primary Cardiologist:  Brandon FORBES Sorrow, MD (Inactive) Primary Electrophysiologist: None   Past Medical History    Past Medical History:  Diagnosis Date   Allergy    Arthritis    Bilateral knee swelling    Glaucoma    Hypertension    Prostate disorder    Schizophrenia (HCC)    Weakness of lower extremity    left knee, uses walker    History of Present Illness  Brandon [name] is a 78 y.o. male with a PMH of HTN, HLD, CKD stage III, aortic atherosclerosis, prostate CAD, venous insufficiency, schizophrenia who presents for overdue follow-up.  Brandon [name] was seen initially by Dr. Sorrow in 2021 for evaluation of hypertension and lower extremity edema.  He completed an echocardiogram which showed EF of 60 to 65% with no RWMA and grade 1 DD with trivial MVR.  Lexiscan  Myoview  results were low risk and normal.  He was advised to use compression hose for lower extremity edema and no statin edema improves with elevation.  He was advised to take as needed Lasix.  He was seen in follow-up on 02/03/2022 sinus tachycardia and was started on metoprolol  25 mg.  He was noted to have an elevated D-dimer and underwent CTA that was negative for PE.  He was last seen by Dr. Sorrow on 08/15/2022 for follow-up visit.  He was doing well with compliance with his current medication regimen.  During today's visit the patient reports*** .  Patient denies chest pain, palpitations, dyspnea, PND, orthopnea, nausea, vomiting, dizziness, syncope, edema, weight gain, or early satiety.  ***Notes: -Last ischemic evaluation: -Last echo: -Interim ED visits: Review of Systems  Please see the history of present illness.    All other systems reviewed and are otherwise negative except as noted above.  Physical Exam    Wt Readings from Last 3 Encounters:  09/01/23 206 lb 5  oz (93.6 kg)  03/28/23 199 lb 12.8 oz (90.6 kg)  08/15/22 205 lb 3.2 oz (93.1 kg)   CD:Uyzmz were no vitals filed for this visit.,There is no height or weight on file to calculate BMI. GEN: Well nourished, well developed in no acute distress Neck: No JVD; No carotid bruits Pulmonary: Clear to auscultation without rales, wheezing or rhonchi  Cardiovascular: Normal rate. Regular rhythm. Normal S1. Normal S2.   Murmurs: There is no murmur.  ABDOMEN: Soft, non-tender, non-distended EXTREMITIES:  No edema; No deformity   EKG/LABS/ Recent Cardiac Studies   ECG personally reviewed by me today - ***  Risk Assessment/Calculations:   {Does this patient have ATRIAL FIBRILLATION?:[id number]}      Lab Results  Component Value Date   WBC 10.5 09/27/2021   HGB 9.1 (L) 09/27/2021   HCT 27.9 (L) 09/27/2021   MCV 84.8 09/27/2021   PLT 155 09/27/2021   Lab Results  Component Value Date   CREATININE 1.40 (H) 02/07/2022   BUN 14 09/26/2021   NA 136 09/26/2021   K 3.8 09/26/2021   CL 104 09/26/2021   CO2 28 09/26/2021   No results found for: CHOL, HDL, LDLCALC, LDLDIRECT, TRIG, CHOLHDL  Lab Results  Component Value Date   HGBA1C 5.9 (H) 05/08/2021   Assessment & Plan    1.  Essential hypertension  2.  Tachycardia  3.  CKD stage IIIa  4.  Shortness of breath  Disposition: Follow-up with Brandon FORBES Sorrow, MD (Inactive) or APP in *** months {Are you ordering a CV Procedure (e.g. stress test, cath, DCCV, TEE, etc)?   Press F2        :[id number]}   Signed, Brandon [name], Brandon Shove, NP 10/23/2023, 10:09 AM [facility]

## 2023-10-25 ENCOUNTER — Ambulatory Visit: Payer: Medicare PPO | Attending: Nurse Practitioner | Admitting: Nurse Practitioner

## 2023-10-25 DIAGNOSIS — I10 Essential (primary) hypertension: Secondary | ICD-10-CM

## 2023-10-25 DIAGNOSIS — R0602 Shortness of breath: Secondary | ICD-10-CM

## 2023-10-25 DIAGNOSIS — N1831 Chronic kidney disease, stage 3a: Secondary | ICD-10-CM

## 2023-10-25 DIAGNOSIS — R000 Tachycardia, unspecified: Secondary | ICD-10-CM

## 2023-10-26 ENCOUNTER — Encounter: Payer: Self-pay | Admitting: Nurse Practitioner

## 2023-11-14 ENCOUNTER — Telehealth: Payer: Self-pay | Admitting: Gastroenterology

## 2023-11-14 ENCOUNTER — Ambulatory Visit: Payer: Medicare PPO | Admitting: Podiatry

## 2023-11-14 ENCOUNTER — Encounter: Payer: Self-pay | Admitting: Podiatry

## 2023-11-14 VITALS — Ht 70.0 in

## 2023-11-14 DIAGNOSIS — L84 Corns and callosities: Secondary | ICD-10-CM | POA: Diagnosis not present

## 2023-11-14 DIAGNOSIS — B351 Tinea unguium: Secondary | ICD-10-CM

## 2023-11-14 DIAGNOSIS — M79674 Pain in right toe(s): Secondary | ICD-10-CM

## 2023-11-14 DIAGNOSIS — I739 Peripheral vascular disease, unspecified: Secondary | ICD-10-CM

## 2023-11-14 DIAGNOSIS — M79675 Pain in left toe(s): Secondary | ICD-10-CM | POA: Diagnosis not present

## 2023-11-14 NOTE — Telephone Encounter (Signed)
Omeprazole -Returned call to patients sister. Advised that if PCP recommends decreasing to once daily dosing then patient may decrease as tolerated. If symptoms are not controlled on once daily dosing, then patient may increase back to twice daily dosing.

## 2023-11-14 NOTE — Telephone Encounter (Signed)
Patients sister called requesting to speak with a nurse regarding the Omeprazole medication dosage because the primary care physician recommended he lower the dosage to 20 mg.

## 2023-11-14 NOTE — Progress Notes (Signed)
Subjective:  Patient ID: Brandon [name], male    DOB: 1945-12-04,  MRN: [id number]  78 y.o. male presents for at risk foot care. Patient has h/o PAD and corn(s) right foot, callus(es) right foot and painful elongated, discolored, dystrophic nails.  Pain interferes with ambulation. Aggravating factors include wearing enclosed shoe gear. Painful toenails interfere with ambulation. Aggravating factors include wearing enclosed shoe gear. Pain is relieved with periodic professional debridement. Painful corns and calluses are aggravated when weightbearing with and without shoegear. Pain is relieved with periodic professional debridement. He is accompanied by his sister on today's visit. Chief Complaint  Patient presents with   RFC    He is here for a nail trim, PCP is Dr. Sherryll Burger, last office visit was 3 months.    .    PCP: Ellyn Hack, MD.  New problem(s): None.   Review of Systems: Negative except as noted in the HPI.   No Known Allergies  Objective:  There were no vitals filed for this visit. Constitutional Patient is a pleasant 78 y.o. African American male obese in NAD. AAO x 3.  Vascular Capillary fill time to digits <4 seconds.  DP/PT pulse(s) are nonpalpable b/l lower extremities. Pedal hair absent b/l. Lower extremity skin temperature gradient warm to cool b/l. No pain with calf compression b/l. No cyanosis or clubbing noted. No ischemia nor gangrene noted b/l. Trace edema noted BLE. Mild varicosities b/l LE.  Neurologic Protective sensation intact 5/5 intact bilaterally with 10g monofilament b/l. Vibratory sensation intact b/l. No clonus b/l.   Dermatologic Pedal skin is thin, shiny and atrophic b/l.  No open wounds b/l lower extremities. No interdigital macerations b/l lower extremities. Toenails 1-5 b/l elongated, discolored, dystrophic, thickened, crumbly with subungual debris and tenderness to dorsal palpation. Hyperkeratotic lesion(s) R hallux and R 3rd toe.  No erythema, no  edema, no drainage, no fluctuance.  Orthopedic: Muscle strength 4/5 to all lower extremity muscle groups bilaterally. Hammertoe deformity noted 1-5 b/l. Pes planovalugs  deformity noted bilateral LE. Genu valgum deformity noted b/l. Utilizes walker for ambulation assistance.   Last HgA1c:      No data to display           Assessment:   1. Pain due to onychomycosis of toenails of both feet   2. Corns and callosities   3. PAD (peripheral artery disease) (HCC)    Plan:  Patient was evaluated and treated and all questions answered. Consent given for treatment as described below: -Patient was evaluated today. All questions/concerns addressed on today's visit. -Patient to continue soft, supportive shoe gear daily. -Mycotic toenails 1-5 bilaterally were debrided in length and girth with sterile nail nippers and dremel without incident. -Corn(s) R 3rd toe pared utilizing sharp debridement with sterile blade without complication or incident. Total number debrided=1. -Callus(es) right great toe pared utilizing sterile scalpel blade without complication or incident. Total number debrided =1. -Continue Lotrimin antifungal spray between toes once daily.. -Patient/POA to call should there be question/concern in the interim.  Return in about 3 months (around 02/12/2024).  Freddie Breech, DPM      [facility] LOCATION: [address] [place], [place] [address]  Office [phone]   [facility] LOCATION: [address] [place], [place] [address] Office [phone] Freddie Breech, DPM

## 2023-11-16 NOTE — Telephone Encounter (Signed)
Returned call to patients sister again. Advised again that patient could decrease down to once daily 40 mg of Omeprazole as tolerated. If patient's symptoms were not controlled on once daily dosing then patient could increase back twice daily. Sister voiced understanding.

## 2023-11-16 NOTE — Telephone Encounter (Signed)
Patient Sister is returning a call she received regarding her brother and how he should be taking his medication. Patient sister is requesting a call back. Please advise.

## 2023-11-16 NOTE — Telephone Encounter (Signed)
Ro looks like you have been talking to this patients sister.

## 2023-11-17 ENCOUNTER — Encounter: Payer: Self-pay | Admitting: Urology

## 2023-11-17 ENCOUNTER — Encounter: Payer: Self-pay | Admitting: Podiatry

## 2023-11-17 ENCOUNTER — Ambulatory Visit: Payer: Medicare PPO | Admitting: Nurse Practitioner

## 2023-11-23 ENCOUNTER — Ambulatory Visit
Admission: RE | Admit: 2023-11-23 | Discharge: 2023-11-23 | Disposition: A | Discharge status: Home / Self Care | Payer: Medicare PPO | Source: Ambulatory Visit | Attending: Urology | Admitting: Urology

## 2023-11-23 DIAGNOSIS — R3915 Urgency of urination: Secondary | ICD-10-CM

## 2023-11-23 DIAGNOSIS — N138 Other obstructive and reflux uropathy: Secondary | ICD-10-CM

## 2023-11-23 DIAGNOSIS — R972 Elevated prostate specific antigen [PSA]: Secondary | ICD-10-CM

## 2023-11-23 MED ORDER — GADOPICLENOL 0.5 MMOL/ML IV SOLN
10.0000 mL | Freq: Once | INTRAVENOUS | Status: AC | PRN
  Administered 2023-11-23: 10 mL via INTRAVENOUS

## 2023-12-12 ENCOUNTER — Encounter: Payer: Self-pay | Admitting: *Deleted

## 2023-12-12 NOTE — Progress Notes (Unsigned)
 Duplicate encounter--no charge

## 2023-12-13 ENCOUNTER — Encounter: Payer: Self-pay | Admitting: Nurse Practitioner

## 2023-12-13 ENCOUNTER — Ambulatory Visit: Payer: Medicare PPO | Attending: Nurse Practitioner | Admitting: Nurse Practitioner

## 2023-12-13 VITALS — BP 104/66 | HR 89 | Ht 70.0 in | Wt 212.0 lb

## 2023-12-13 DIAGNOSIS — I10 Essential (primary) hypertension: Secondary | ICD-10-CM

## 2023-12-13 DIAGNOSIS — R0602 Shortness of breath: Secondary | ICD-10-CM | POA: Diagnosis not present

## 2023-12-13 DIAGNOSIS — N1831 Chronic kidney disease, stage 3a: Secondary | ICD-10-CM

## 2023-12-13 DIAGNOSIS — R000 Tachycardia, unspecified: Secondary | ICD-10-CM

## 2023-12-13 NOTE — Patient Instructions (Signed)
 Testing/Procedures: Echo   Your physician has requested that you have an echocardiogram. Echocardiography is a painless test that uses sound waves to create images of your heart. It provides your doctor with information about the size and shape of your heart and how well your heart's chambers and valves are working. This procedure takes approximately one hour. There are no restrictions for this procedure. Please do NOT wear cologne, perfume, aftershave, or lotions (deodorant is allowed). Please arrive 15 minutes prior to your appointment time.  Please note: We ask at that you not bring children with you during ultrasound (echo/ vascular) testing. Due to room size and safety concerns, children are not allowed in the ultrasound rooms during exams. Our front office staff cannot provide observation of children in our lobby area while testing is being conducted. An adult accompanying a patient to their appointment will only be allowed in the ultrasound room at the discretion of the ultrasound technician under special circumstances. We apologize for any inconvenience.    Follow-Up: At [facility], you and your health needs are our priority.  As part of our continuing mission to provide you with exceptional heart care, we have created designated Provider Care Teams.  These Care Teams include your primary Cardiologist (physician) and Advanced Practice Providers (APPs -  Physician Assistants and Nurse Practitioners) who all work together to provide you with the care you need, when you need it.   Your next appointment:   6 month(s)  Provider:   Dr. Odis Hollingshead     Other Instructions   1st Floor: - Lobby - Registration  - Pharmacy  - Lab - Cafe  2nd Floor: - PV Lab - Diagnostic Testing (echo, CT, nuclear med)  3rd Floor: - Vacant  4th Floor: - TCTS (cardiothoracic surgery) - AFib Clinic - Structural Heart Clinic - Vascular Surgery  - Vascular Ultrasound  5th Floor: - HeartCare  Cardiology (general and EP) - Clinical Pharmacy for coumadin, hypertension, lipid, weight-loss medications, and med management appointments    Valet parking services will be available as well.

## 2023-12-13 NOTE — Addendum Note (Signed)
 Addended by: Neita Goodnight B on: 12/13/2023 05:04 PM   Modules accepted: Orders

## 2023-12-13 NOTE — Progress Notes (Signed)
 Cardiology Office Note    Patient Name: Brandon [name] Date of Encounter: 12/13/2023  Primary Care Provider:  Ellyn Hack, MD Primary Cardiologist:  Meriam Sprague, MD (Inactive) Primary Electrophysiologist: None   Past Medical History    Past Medical History:  Diagnosis Date   Allergy    Arthritis    Bilateral knee swelling    Glaucoma    Hypertension    Prostate disorder    Schizophrenia (HCC)    Weakness of lower extremity    left knee, uses walker    History of Present Illness  Brandon [name] is a 78 y.o. 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   Brandon [name] was seen initially by Dr. Shari Prows in 2021 for evaluation of hypertension and lower extremity edema.  He completed an echocardiogram which showed EF of 60 to 65% with no RWMA and grade 1 DD with trivial MVR.  Lexiscan Myoview results were low risk and normal.  He was advised to use compression hose for lower extremity edema and no statin edema improves with elevation.  He was advised to take as needed Lasix.  He was seen in follow-up on 02/03/2022 sinus tachycardia and was started on metoprolol 25 mg.  He was noted to have an elevated D-dimer and underwent CTA that was negative for PE.  He was last seen by Dr. Shari Prows on 08/15/2022 for follow-up visit.  He was doing well with compliance with his current medication regimen. He reported some LE edema. He was advised to use compression socks and was continued on beta blocker therapy.  Brandon [name] presents today for follow-up with his wife. He reports persistent swelling in his ankles, which has been previously managed with compression stockings. However, the patient admits to not wearing the stockings recently due to needing a new pair. Despite the swelling, the patient denies any  new cardiac complaints or changes since his last visit. He denies any shortness of breath, even when lying flat at night. The patient's heart rate is consistently high, around 95, but no abnormal rhythms or extra beats are noted. The patient's blood pressure is 104/66, which is considered acceptable given his current medication regimen. The patient does not report any symptoms of low blood pressure such as dizziness or fatigue. The patient has been prescribed Lasix (furosemide) as needed for fluid retention, but he admits to not taking it due to frequent urination. The patient's weight has increased slightly since his last visit, from 206 to 212, but it is unclear if this is due to fluid retention or other factors. The patient does not regularly weigh himself at home.    Patient denies chest pain, palpitations, dyspnea, PND, orthopnea, nausea, vomiting, dizziness, syncope, edema, weight gain, or early satiety.   Review of Systems  Please see the history of present illness.    All other systems reviewed and are otherwise negative except as noted above.  Physical Exam    Wt Readings from Last 3 Encounters:  12/13/23 212 lb (96.2 kg)  09/01/23 206 lb 5 oz (93.6 kg)  03/28/23 199 lb 12.8 oz (90.6 kg)   VS: Vitals:   12/13/23 1515  BP: 104/66  Pulse: 89  SpO2: 96%  ,Body mass index is 30.42 kg/m. GEN: Well nourished, well developed in no acute distress Neck: No JVD; No carotid bruits  Pulmonary: Clear to auscultation without rales, wheezing or rhonchi  Cardiovascular: Normal rate. Regular rhythm. Normal S1. Normal S2.   Murmurs: There is no murmur.  ABDOMEN: Soft, non-tender, non-distended EXTREMITIES:  No edema; No deformity   EKG/LABS/ Recent Cardiac Studies   ECG personally reviewed by me today -sinus rhythm with rate of 95 bpm and no acute changes consistent with previous EKG.  Risk Assessment/Calculations:          Lab Results  Component Value Date   WBC 10.5 09/27/2021   HGB  9.1 (L) 09/27/2021   HCT 27.9 (L) 09/27/2021   MCV 84.8 09/27/2021   PLT 155 09/27/2021   Lab Results  Component Value Date   CREATININE 1.40 (H) 02/07/2022   BUN 14 09/26/2021   NA 136 09/26/2021   K 3.8 09/26/2021   CL 104 09/26/2021   CO2 28 09/26/2021   No results found for: "CHOL", "HDL", "LDLCALC", "LDLDIRECT", "TRIG", "CHOLHDL"  Lab Results  Component Value Date   HGBA1C 5.9 (H) 05/08/2021   Assessment & Plan    1.  Essential hypertension: -Patient's blood pressure today was stable at 104/66 -Continue losartan 50 mg daily, Toprol-XL 25 mg daily  2.  Tachycardia: -Patient's heart rate today is controlled sinus at rate of 96 bpm and reports no elevated heart rates since previous visit. -Continue Toprol-XL 25 mg daily   3.  CKD stage IIIa: -Patient's last creatinine was 1.2 -Continue current treatment plan per PCP   4.  Shortness of breath: -Patient reports no recurrent shortness of breath since previous visit. -He was advised to use Lasix 40 mg as needed  5.  Hyperlipidemia: -Patient's last LDL cholesterol was at goal of 39 -Continue Crestor 5 mg daily     Disposition: Follow-up with Meriam Sprague, MD (Inactive) or APP in 6 months    Signed, Napoleon Form, Leodis Rains, NP 12/13/2023, 3:26 PM  [facility]

## 2024-01-02 ENCOUNTER — Ambulatory Visit: Payer: Medicare PPO | Admitting: Nurse Practitioner

## 2024-01-30 ENCOUNTER — Other Ambulatory Visit: Payer: Self-pay | Admitting: Physician Assistant

## 2024-02-23 ENCOUNTER — Other Ambulatory Visit: Payer: Self-pay

## 2024-02-23 MED ORDER — LOSARTAN POTASSIUM 50 MG PO TABS: 50.0000 mg | ORAL_TABLET | Freq: Every day | ORAL | 2 refills | Status: AC

## 2024-02-28 ENCOUNTER — Ambulatory Visit (HOSPITAL_COMMUNITY): Payer: Medicare PPO

## 2024-02-29 ENCOUNTER — Other Ambulatory Visit: Payer: Self-pay

## 2024-02-29 MED ORDER — METOPROLOL SUCCINATE ER 25 MG PO TB24: ORAL_TABLET | ORAL | 3 refills | Status: AC

## 2024-03-06 ENCOUNTER — Ambulatory Visit (INDEPENDENT_AMBULATORY_CARE_PROVIDER_SITE_OTHER): Payer: Medicare PPO | Admitting: Podiatry

## 2024-03-06 ENCOUNTER — Encounter: Payer: Self-pay | Admitting: Podiatry

## 2024-03-06 DIAGNOSIS — I739 Peripheral vascular disease, unspecified: Secondary | ICD-10-CM | POA: Diagnosis not present

## 2024-03-06 DIAGNOSIS — M79674 Pain in right toe(s): Secondary | ICD-10-CM

## 2024-03-06 DIAGNOSIS — B351 Tinea unguium: Secondary | ICD-10-CM | POA: Diagnosis not present

## 2024-03-06 DIAGNOSIS — L84 Corns and callosities: Secondary | ICD-10-CM

## 2024-03-06 DIAGNOSIS — M79675 Pain in left toe(s): Secondary | ICD-10-CM | POA: Diagnosis not present

## 2024-03-11 NOTE — Progress Notes (Signed)
  Subjective:  Patient ID: Brandon [name], male    DOB: [birth year],  MRN: [id number]  Brandon [name] presents to clinic today for at risk foot care. Patient has h/o PAD and corn(s) right foot, callus(es) right foot and painful mycotic nails.  Pain interferes with ambulation. Aggravating factors include wearing enclosed shoe gear. Painful toenails interfere with ambulation. Aggravating factors include wearing enclosed shoe gear. Pain is relieved with periodic professional debridement. Painful corns and calluses are aggravated when weightbearing with and without shoegear. Pain is relieved with periodic professional debridement.  Chief Complaint  Patient presents with   Nail Problem    rfc   New problem(s): None.   PCP is Ulysees Gander, MD.  No Known Allergies  Review of Systems: Negative except as noted in the HPI.  Objective: No changes noted in today's physical examination. There were no vitals filed for this visit. Brandon [name] is a pleasant 78 y.o. male in NAD. AAO x 3.  Vascular Examination: CFT <4 seconds b/l. DP pulses diminished b/l. PT pulses diminished b/l. Digital hair absent. Skin temperature gradient warm to cool b/l. No ischemia or gangrene. No cyanosis or clubbing noted b/l. Trace edema noted BLE. Varicosities present b/l.   Neurological Examination: Sensation grossly intact b/l with 10 gram monofilament. Vibratory sensation intact b/l.   Dermatological Examination: Pedal skin thin, shiny and atrophic b/l. No open wounds. No interdigital macerations.   Toenails 1-5 b/l thick, discolored, elongated with subungual debris and pain on dorsal palpation.   Pedal skin thin and atrophic b/l LE. Toenails 1-5 b/l well maintained with adequate length. No erythema, no edema, no drainage, no fluctuance. Hyperkeratotic lesion(s) right great toe and right third digit.  No erythema, no edema, no drainage, no fluctuance.  Musculoskeletal Examination: Muscle strength 5/5 to  all lower extremity muscle groups bilaterally. Hammertoe deformity noted 1-5 b/l. Pes planovalgus deformity noted b/l feet. Utilizes walker for ambulation assistance.  Radiographs: None  Assessment/Plan: 1. Pain due to onychomycosis of toenails of both feet   2. Corns and callosities   3. PAD (peripheral artery disease) (HCC)     Consent given for treatment. Patient examined. All patient's and/or POA's questions/concerns addressed on today's visit. Toenails 1-5 debrided in length and girth without incident. Corn(s) and callus(es) right great toe and right third digit pared with sharp debridement without incident. Continue soft, supportive shoe gear daily. Report any pedal injuries to medical professional. Call office if there are any questions/concerns.   Return in about 3 months (around 06/06/2024).  Luella Sager, DPM      [place] LOCATION: [address] [place] [address]                   Office [phone]   [facility] LOCATION: [address] [place], [place] [address] Office [phone]

## 2024-04-03 ENCOUNTER — Ambulatory Visit (HOSPITAL_COMMUNITY): Attending: Cardiology

## 2024-04-04 ENCOUNTER — Encounter (HOSPITAL_COMMUNITY): Payer: Self-pay | Admitting: Nurse Practitioner

## 2024-05-31 ENCOUNTER — Ambulatory Visit: Payer: Self-pay | Admitting: General Practice

## 2024-05-31 ENCOUNTER — Ambulatory Visit (HOSPITAL_COMMUNITY)
Admission: RE | Admit: 2024-05-31 | Discharge: 2024-05-31 | Disposition: A | Discharge status: Home / Self Care | Source: Ambulatory Visit | Attending: Nurse Practitioner | Admitting: Nurse Practitioner

## 2024-05-31 DIAGNOSIS — R0602 Shortness of breath: Secondary | ICD-10-CM | POA: Diagnosis not present

## 2024-05-31 DIAGNOSIS — I10 Essential (primary) hypertension: Secondary | ICD-10-CM

## 2024-05-31 LAB — ECHOCARDIOGRAM COMPLETE
Area-P 1/2: 3.6 cm2
S' Lateral: 2.71 cm

## 2024-06-10 ENCOUNTER — Encounter: Payer: Self-pay | Admitting: Cardiology

## 2024-06-10 ENCOUNTER — Ambulatory Visit: Attending: Cardiology | Admitting: Cardiology

## 2024-06-10 ENCOUNTER — Other Ambulatory Visit: Payer: Self-pay | Admitting: Gastroenterology

## 2024-06-10 VITALS — BP 123/69 | HR 57 | Resp 16 | Ht 70.0 in | Wt 221.7 lb

## 2024-06-10 DIAGNOSIS — R609 Edema, unspecified: Secondary | ICD-10-CM | POA: Diagnosis not present

## 2024-06-10 DIAGNOSIS — I10 Essential (primary) hypertension: Secondary | ICD-10-CM

## 2024-06-10 DIAGNOSIS — E782 Mixed hyperlipidemia: Secondary | ICD-10-CM | POA: Diagnosis not present

## 2024-06-10 DIAGNOSIS — R000 Tachycardia, unspecified: Secondary | ICD-10-CM

## 2024-06-10 DIAGNOSIS — R0602 Shortness of breath: Secondary | ICD-10-CM

## 2024-06-10 NOTE — Patient Instructions (Addendum)
 Medication Instructions:  CHANGE - take Furosemide (Lasix) 40 mg once daily   *If you need a refill on your cardiac medications before your next appointment, please call your pharmacy*  Lab Work: To be completed in 1 week: BMP  If you have labs (blood work) drawn today and your tests are completely normal, you will receive your results only by: MyChart Message (if you have MyChart) OR A paper copy in the mail If you have any lab test that is abnormal or we need to change your treatment, we will call you to review the results.  Testing/Procedures: Your physician has requested that you have a lower extremity venous duplex. This test is an ultrasound of the veins in the legs or arms. It looks at venous blood flow that carries blood from the heart to the legs or arms. Allow one hour for a Lower Venous exam. Allow thirty minutes for an Upper Venous exam. There are no restrictions or special instructions.  Please note: We ask at that you not bring children with you during ultrasound (echo/ vascular) testing. Due to room size and safety concerns, children are not allowed in the ultrasound rooms during exams. Our front office staff cannot provide observation of children in our lobby area while testing is being conducted. An adult accompanying a patient to their appointment will only be allowed in the ultrasound room at the discretion of the ultrasound technician under special circumstances. We apologize for any inconvenience.   Follow-Up: At [facility], you and your health needs are our priority.  As part of our continuing mission to provide you with exceptional heart care, our providers are all part of one team.  This team includes your primary Cardiologist (physician) and Advanced Practice Providers or APPs (Physician Assistants and Nurse Practitioners) who all work together to provide you with the care you need, when you need it.  Your next appointment:   1 year(s)  Provider:   Madonna Large, DO

## 2024-06-10 NOTE — Progress Notes (Signed)
 Cardiology Office Note:  .   ID:  Brandon [name], Brandon [name] 1946-02-10, MRN [id number] PCP:  Brandon Leni Edyth DELENA, MD  Former Cardiology Providers: Dr. Powell [name] [facility] Providers Cardiologist:  Brandon Large, DO , [facility] (established care 06/10/24) Electrophysiologist:  None  Click to update primary MD,subspecialty MD or APP then REFRESH:1}    Chief Complaint  Patient presents with   Follow-up    Reestablish care with cardiology.    History of Present Illness: Brandon [name]   Brandon [name] is a 78 y.o. 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  Formally under the care of Dr. Sorrow who last saw Brandon [name] back in 07/2022. I am seeing him for the first time to re-establishing care.   Seen by Dr. Sorrow in 2021 for hypertension and lower extremity swelling.  Echocardiogram was ordered which noted preserved LVEF, grade 1 diastolic dysfunction, and no significant valvular heart disease.  Lexiscan  stress test was reported to be low risk as well.  Patient was recommended to have lower extremity compression stockings and diuretics for swelling management.  Last seen by Brandon [name] nurse practitioner in February 2025.  At that time he was doing well overall and no significant changes were made.  He presents today for follow-up and to reestablish care.  Patient is accompanied by his sister Brandon [name] at today's office visit also provides collateral history.  Patient denies anginal chest pain or heart failure symptoms.  He experiences swelling in his legs, which he describes as 'about the same' as usual. He has not been on long car rides or plane rides recently. He has been taking Lasix intermittently, with a prescription for Lasix 40 mg, which he takes as  needed. He is reluctant to take Lasix daily due to frequent urination.  His dietary habits include frequent consumption of fast food, processed foods, sister states we do not cook every day.   Review of Systems: .   Review of Systems  Cardiovascular:  Positive for leg swelling (Chronic and stable). Negative for chest pain, claudication, irregular heartbeat, near-syncope, orthopnea, palpitations, paroxysmal nocturnal dyspnea and syncope.  Respiratory:  Positive for shortness of breath (Chronic and stable).   Hematologic/Lymphatic: Negative for bleeding problem.    Studies Reviewed:   Echocardiogram: 10/07/20 EF 60-65, no RWMA, Gr 1 DD, GLS -18.0%, normal RVSF, trivial MR  05/31/2024  1. Left ventricular ejection fraction, by estimation, is 60 to 65%. The left ventricle has normal function. The left ventricle has no regional wall motion abnormalities. Left ventricular diastolic parameters are consistent with Grade I diastolic dysfunction (impaired relaxation). The average left ventricular global longitudinal strain is -30.6 %. The global longitudinal strain is normal.   2. Right ventricular systolic function is normal. The right ventricular size is normal.   3. The mitral valve is normal in structure. Trivial mitral valve regurgitation. No evidence of mitral stenosis.   4. The aortic valve is tricuspid. Aortic valve regurgitation is not visualized. No aortic stenosis is present.   5. The inferior vena cava is normal in size with greater than 50% respiratory variability, suggesting right atrial pressure of 3 mmHg.   RADIOLOGY: CTA PE protocol study: April 2023 There is no evidence of central pulmonary artery embolism. There is no evidence of thoracic aortic dissection. There is no focal pulmonary consolidation.  There is fluid in the  lumen of thoracic esophagus suggesting gastroesophageal reflux.  Risk Assessment/Calculations:   NA   Labs:       Latest Ref Rng & Units 09/27/2021     4:27 AM 09/26/2021    4:54 AM 09/24/2021    5:06 AM  CBC  WBC 4.0 - 10.5 K/uL 10.5  9.6  13.7   Hemoglobin 13.0 - 17.0 g/dL 9.1  8.5  89.7   Hematocrit 39.0 - 52.0 % 27.9  26.2  32.2   Platelets 150 - 400 K/uL 155  149  164        Latest Ref Rng & Units 02/07/2022    4:07 PM 09/26/2021    4:54 AM 09/24/2021    5:06 AM  BMP  Glucose 70 - 99 mg/dL  91  882   BUN 8 - 23 mg/dL  14  10   Creatinine 9.38 - 1.24 mg/dL 8.59  8.99  8.71   Sodium 135 - 145 mmol/L  136  133   Potassium 3.5 - 5.1 mmol/L  3.8  4.0   Chloride 98 - 111 mmol/L  104  103   CO2 22 - 32 mmol/L  28  22   Calcium  8.9 - 10.3 mg/dL  8.4  8.2       Latest Ref Rng & Units 02/07/2022    4:07 PM 09/26/2021    4:54 AM 09/24/2021    5:06 AM  CMP  Glucose 70 - 99 mg/dL  91  882   BUN 8 - 23 mg/dL  14  10   Creatinine 9.38 - 1.24 mg/dL 8.59  8.99  8.71   Sodium 135 - 145 mmol/L  136  133   Potassium 3.5 - 5.1 mmol/L  3.8  4.0   Chloride 98 - 111 mmol/L  104  103   CO2 22 - 32 mmol/L  28  22   Calcium  8.9 - 10.3 mg/dL  8.4  8.2     No results found for: CHOL, HDL, LDLCALC, LDLDIRECT, TRIG, CHOLHDL No results for input(s): LIPOA in the last 8760 hours. No components found for: NTPROBNP No results for input(s): PROBNP in the last 8760 hours. No results for input(s): TSH in the last 8760 hours.  Physical Exam:    Today's Vitals   06/10/24 1610  BP: 123/69  Pulse: (!) 57  Resp: 16  SpO2: 98%  Weight: 221 lb 11.2 oz (100.6 kg)  Height: 5' 10 (1.778 m)   Body mass index is 31.81 kg/m. Wt Readings from Last 3 Encounters:  06/10/24 221 lb 11.2 oz (100.6 kg)  12/13/23 212 lb (96.2 kg)  09/01/23 206 lb 5 oz (93.6 kg)    Physical Exam  Constitutional: No distress.  hemodynamically stable  Neck: No JVD present.  Cardiovascular: Normal rate, regular rhythm, S1 normal and S2 normal. Exam reveals no gallop, no S3 and no S4.  No murmur heard. Pulmonary/Chest: Effort normal and breath sounds  normal. No stridor. He has no wheezes. He has no rales.  Musculoskeletal:        General: Edema present.     Cervical back: Neck supple.  Skin: Skin is warm.   Impression & Recommendation(s):  Impression:   ICD-10-CM   1. Shortness of breath  R06.02     2. Primary hypertension  I10 Basic metabolic panel with GFR    Basic metabolic panel with GFR    3. Mixed hyperlipidemia  E78.2     4. Edema, unspecified type  R60.9  VAS US  LOWER EXTREMITY VENOUS (DVT)       Recommendation(s):  Shortness of breath Chronic and stable. Echo August 2025: Preserved LVEF 60 to 65%, grade 1 diastolic dysfunction, no significant valvular heart disease, results independently reviewed. Stage B, NYHA class II/III Reemphasized importance of reducing salt in her diet. Continue Lasix 40 mg p.o. as needed for swelling.  Recommend starting it daily. BMP in one week to check renal function and electrolytes Continue losartan  50 mg p.o. daily. Continue Toprol -XL 25 mg p.o. daily.  Primary hypertension Office blood pressures are well-controlled. Medications as discussed above  Mixed hyperlipidemia Continue Crestor  5 mg p.o. nightly Cardiology following peripherally, managed by primary care provider.  Edema, unspecified type Patient states that is chronic and stable. Will change Lasix from as needed to daily Check lower extremity venous duplex to rule out DVT Reemphasized importance of avoiding prolonged immobilization. Monitor for now  Discussed management of at least 2 chronic comorbid conditions. Prescription drug management. Independently reviewed echocardiogram 05/2024, CT results 11/2021 Additional diagnostic tests ordered Plan of care discussed with patient and sister   Orders Placed:  Orders Placed This Encounter  Procedures   Basic metabolic panel with GFR    Standing Status:   Future    Number of Occurrences:   1    Expected Date:   06/17/2024    Expiration Date:   06/10/2025     Final  Medication List:   No orders of the defined types were placed in this encounter.   Medications Discontinued During This Encounter  Medication Reason   fluticasone  (FLONASE ) 50 MCG/ACT nasal spray Patient Preference   ketoconazole  (NIZORAL ) 2 % cream Patient Preference   Miconazole  Nitrate 2 % AERO Patient Preference     Current Outpatient Medications:    acetaminophen  (TYLENOL ) 650 MG CR tablet, Take 650 mg by mouth every 8 (eight) hours as needed for pain., Disp: , Rfl:    ammonium lactate  (AMLACTIN) 12 % cream, Apply 1 Application topically as needed for dry skin., Disp: 385 g, Rfl: 3   aspirin  EC 81 MG tablet, Take 81 mg by mouth daily., Disp: , Rfl:    buPROPion  (WELLBUTRIN  SR) 150 MG 12 hr tablet, Take 150 mg by mouth daily., Disp: , Rfl:    Cholecalciferol (VITAMIN D3) 25 MCG (1000 UT) CAPS, Take 2,000 Units by mouth daily., Disp: , Rfl:    Cod Liver Oil 1000 MG CAPS, Take 1,000 mg by mouth daily., Disp: , Rfl:    finasteride  (PROSCAR ) 5 MG tablet, Take 5 mg by mouth daily., Disp: , Rfl:    furosemide (LASIX) 40 MG tablet, Take 40 mg by mouth daily., Disp: , Rfl:    losartan  (COZAAR ) 50 MG tablet, Take 1 tablet (50 mg total) by mouth daily., Disp: 90 tablet, Rfl: 2   metoprolol  succinate (TOPROL -XL) 25 MG 24 hr tablet, TAKE 1 TABLET(25 MG) BY MOUTH AT BEDTIME, Disp: 90 tablet, Rfl: 3   Omega-3 Fatty Acids (FISH OIL) 1000 MG CAPS, Take 1,000 mg by mouth daily., Disp: , Rfl:    omeprazole  (PRILOSEC) 40 MG capsule, TAKE 1 CAPSULE(40 MG) BY MOUTH TWICE DAILY BEFORE A MEAL, Disp: 60 capsule, Rfl: 2   oxybutynin  (DITROPAN -XL) 5 MG 24 hr tablet, Take 5 mg by mouth daily., Disp: , Rfl:    polyethylene glycol (MIRALAX ) 17 g packet, Take 17 g by mouth daily., Disp: , Rfl:    rosuvastatin  (CRESTOR ) 5 MG tablet, Take 5 mg by mouth at bedtime., Disp: ,  Rfl:    tamsulosin  (FLOMAX ) 0.4 MG CAPS capsule, Take 0.4 mg by mouth daily., Disp: , Rfl:    Travoprost, BAK Free, (TRAVATAN) 0.004 % SOLN  ophthalmic solution, Place 1 drop into both eyes at bedtime., Disp: , Rfl:    trifluoperazine  (STELAZINE ) 5 MG tablet, Take 5 mg by mouth at bedtime., Disp: , Rfl:   Consent:   NA  Disposition:   1 year follow-up sooner if needed  His questions and concerns were addressed to his satisfaction. He voices understanding of the recommendations provided during this encounter.    Signed, Brandon Michele HAS, [facility]  A Division of [facility] [address], [place], [place] [address]

## 2024-06-18 ENCOUNTER — Ambulatory Visit (HOSPITAL_COMMUNITY): Admission: RE | Admit: 2024-06-18 | Source: Ambulatory Visit

## 2024-06-18 ENCOUNTER — Telehealth: Payer: Self-pay | Admitting: Cardiology

## 2024-06-18 NOTE — Telephone Encounter (Signed)
 Patient's sister is requesting to speak with Dr. Tyree nurse in regard to recent lab work. Please advise.

## 2024-06-19 NOTE — Telephone Encounter (Signed)
 Tried calling patient's sister. No answer, voicemail full unable to leave a message.

## 2024-06-20 NOTE — Telephone Encounter (Signed)
Unable to reach pt or leave a message mailbox is full 

## 2024-06-24 ENCOUNTER — Ambulatory Visit (HOSPITAL_COMMUNITY)
Admission: RE | Admit: 2024-06-24 | Discharge: 2024-06-24 | Disposition: A | Discharge status: Home / Self Care | Source: Ambulatory Visit | Attending: Cardiology | Admitting: Cardiology

## 2024-06-24 DIAGNOSIS — R609 Edema, unspecified: Secondary | ICD-10-CM | POA: Diagnosis not present

## 2024-06-25 ENCOUNTER — Ambulatory Visit: Payer: Self-pay | Admitting: Cardiology

## 2024-06-25 NOTE — Telephone Encounter (Signed)
 Spoke to patient sister Barnie . Result given - all question were answered.  Aware to have lab work done in a week per Dr Michele - after starting new medication.

## 2024-06-25 NOTE — Telephone Encounter (Signed)
-----   Message from [facility] sent at 06/25/2024 12:23 PM EDT ----- No evidence of DVT in the bilateral lower extremities, per report  Dr. Michele ----- Message ----- From: Interface, [employer] Sent: 06/24/2024   5:25 PM EDT To: Madonna Michele, DO

## 2024-07-03 ENCOUNTER — Encounter: Payer: Self-pay | Admitting: Podiatry

## 2024-07-03 ENCOUNTER — Ambulatory Visit (INDEPENDENT_AMBULATORY_CARE_PROVIDER_SITE_OTHER): Admitting: Podiatry

## 2024-07-03 DIAGNOSIS — M79674 Pain in right toe(s): Secondary | ICD-10-CM

## 2024-07-03 DIAGNOSIS — N189 Chronic kidney disease, unspecified: Secondary | ICD-10-CM

## 2024-07-03 DIAGNOSIS — R601 Generalized edema: Secondary | ICD-10-CM

## 2024-07-03 DIAGNOSIS — B351 Tinea unguium: Secondary | ICD-10-CM

## 2024-07-03 DIAGNOSIS — I739 Peripheral vascular disease, unspecified: Secondary | ICD-10-CM

## 2024-07-03 DIAGNOSIS — Z91198 Patient's noncompliance with other medical treatment and regimen for other reason: Secondary | ICD-10-CM

## 2024-07-03 DIAGNOSIS — M79675 Pain in left toe(s): Secondary | ICD-10-CM

## 2024-07-03 NOTE — Progress Notes (Signed)
 1. Failure to attend appointment with reason given    Patient exceeded 15 min grace time for appointment. Seen by another provider.

## 2024-07-04 NOTE — Progress Notes (Signed)
 This patient returns to my office for at risk foot care.  This patient requires this care by a professional since this patient will be at risk due to having CKD and PAD.  This patient is unable to cut nails himself since the patient cannot reach his nails.These nails are painful walking and wearing shoes.  This patient presents for at risk foot care today.  General Appearance  Alert, conversant and in no acute stress.  Vascular  Dorsalis pedis and posterior tibial  pulses are  weakly palpable  bilaterally.  Capillary return is within normal limits  bilaterally. Cold feet bilaterally.  Swelling  B/L.   Neurologic  Senn-Weinstein monofilament wire test within normal limits  bilaterally. Muscle power within normal limits bilaterally.  Nails Thick disfigured discolored nails with subungual debris  from hallux to fifth toes bilaterally. No evidence of bacterial infection or drainage bilaterally.  Orthopedic  No limitations of motion  feet .  No crepitus or effusions noted. Contracted digits  B/L.  Skin  normotropic skin with no porokeratosis noted bilaterally.  No signs of infections or ulcers noted.     Onychomycosis  Pain in right toes  Pain in left toes  Consent was obtained for treatment procedures.   Mechanical debridement of nails 1-5  bilaterally performed with a nail nipper.  Filed with dremel without incident.    Return office visit   10 weeks                   Told patient to return for periodic foot care and evaluation due to potential at risk complications.   Cordella Bold DPM

## 2024-07-11 LAB — BASIC METABOLIC PANEL WITH GFR
BUN/Creatinine Ratio: 16 (ref 10–24)
BUN: 24 mg/dL (ref 8–27)
CO2: 21 mmol/L (ref 20–29)
Calcium: 9.3 mg/dL (ref 8.6–10.2)
Chloride: 103 mmol/L (ref 96–106)
Creatinine, Ser: 1.53 mg/dL — ABNORMAL HIGH (ref 0.76–1.27)
Glucose: 91 mg/dL (ref 70–99)
Potassium: 4.4 mmol/L (ref 3.5–5.2)
Sodium: 136 mmol/L (ref 134–144)
eGFR: 47 mL/min/1.73 — ABNORMAL LOW (ref >59)

## 2024-09-09 ENCOUNTER — Ambulatory Visit: Admitting: Podiatry

## 2024-10-09 ENCOUNTER — Ambulatory Visit: Admitting: Podiatry

## 2024-10-18 ENCOUNTER — Telehealth: Payer: Self-pay | Admitting: Gastroenterology

## 2024-10-18 MED ORDER — OMEPRAZOLE 40 MG PO CPDR: 40.0000 mg | DELAYED_RELEASE_CAPSULE | Freq: Two times a day (BID) | ORAL | 2 refills | Status: AC

## 2024-10-18 NOTE — Telephone Encounter (Signed)
 Inbound call from patients sister stating patient needs refill on medication omeprazole . Patient is scheduled for a follow up on 12/13/24. Please advise  Thank You

## 2024-10-18 NOTE — Telephone Encounter (Signed)
 Refill sent to pharmacy.

## 2024-11-06 ENCOUNTER — Ambulatory Visit: Admitting: Podiatry

## 2024-11-06 ENCOUNTER — Encounter: Payer: Self-pay | Admitting: Podiatry

## 2024-11-06 DIAGNOSIS — R601 Generalized edema: Secondary | ICD-10-CM | POA: Diagnosis not present

## 2024-11-06 DIAGNOSIS — M79675 Pain in left toe(s): Secondary | ICD-10-CM | POA: Diagnosis not present

## 2024-11-06 DIAGNOSIS — I739 Peripheral vascular disease, unspecified: Secondary | ICD-10-CM

## 2024-11-06 DIAGNOSIS — M79674 Pain in right toe(s): Secondary | ICD-10-CM

## 2024-11-06 DIAGNOSIS — N189 Chronic kidney disease, unspecified: Secondary | ICD-10-CM

## 2024-11-06 DIAGNOSIS — B351 Tinea unguium: Secondary | ICD-10-CM | POA: Diagnosis not present

## 2024-11-06 NOTE — Progress Notes (Signed)
 This patient returns to my office for at risk foot care.  This patient requires this care by a professional since this patient will be at risk due to having CKD.  This patient is unable to cut nails himself since the patient cannot reach his nails.These nails are painful walking and wearing shoes.  This patient presents for at risk foot care today.  General Appearance  Alert, conversant and in no acute stress.  Vascular  Dorsalis pedis and posterior tibial  pulses are absent due to swelling. bilaterally.  Capillary return is within normal limits  bilaterally. Temperature is within normal limits  bilaterally.  Neurologic  Senn-Weinstein monofilament wire test within normal limits  bilaterally. Muscle power within normal limits bilaterally.  Nails Thick disfigured discolored nails with subungual debris  from hallux to fifth toes bilaterally. No evidence of bacterial infection or drainage bilaterally.  Orthopedic  No limitations of motion  feet .  No crepitus or effusions noted.  HAV  B/L.  Skin  normotropic skin with no porokeratosis noted bilaterally.  No signs of infections or ulcers noted.  Alligator type skin.   Onychomycosis  Pain in right toes  Pain in left toes  Consent was obtained for treatment procedures.   Mechanical debridement of nails 1-5  bilaterally performed with a nail nipper.  Filed with dremel without incident.    Return office visit   3 months                   Told patient to return for periodic foot care and evaluation due to potential at risk complications.   Cordella Bold DPM

## 2024-11-14 NOTE — Telephone Encounter (Signed)
 Incoming call from pts sister stating pharmacy did not receive refill. Requesting call to confirm refill is sent Please advise. Thank you.

## 2024-11-15 NOTE — Telephone Encounter (Signed)
 Call patient/patient's sister to confirm pharmacy. Unable to leave message.

## 2024-12-13 ENCOUNTER — Ambulatory Visit: Admitting: Gastroenterology

## 2025-01-08 ENCOUNTER — Ambulatory Visit: Admitting: Podiatry

## 2025-02-11 ENCOUNTER — Ambulatory Visit: Admitting: Podiatry
